# Patient Record
Sex: Male | Born: 1945 | Race: White | Hispanic: No | Smoking: Former smoker
Health system: Southern US, Community
[De-identification: ages and names within clinical notes are randomized; demographics above are authoritative.]

## PROBLEM LIST (undated history)

## (undated) DIAGNOSIS — M722 Plantar fascial fibromatosis: Secondary | ICD-10-CM

## (undated) DIAGNOSIS — S2239XA Fracture of one rib, unspecified side, initial encounter for closed fracture: Secondary | ICD-10-CM

## (undated) DIAGNOSIS — K635 Polyp of colon: Secondary | ICD-10-CM

## (undated) DIAGNOSIS — K449 Diaphragmatic hernia without obstruction or gangrene: Secondary | ICD-10-CM

## (undated) DIAGNOSIS — I10 Essential (primary) hypertension: Secondary | ICD-10-CM

## (undated) DIAGNOSIS — R918 Other nonspecific abnormal finding of lung field: Secondary | ICD-10-CM

## (undated) DIAGNOSIS — K219 Gastro-esophageal reflux disease without esophagitis: Secondary | ICD-10-CM

## (undated) DIAGNOSIS — M1990 Unspecified osteoarthritis, unspecified site: Secondary | ICD-10-CM

## (undated) DIAGNOSIS — R0602 Shortness of breath: Secondary | ICD-10-CM

## (undated) DIAGNOSIS — G4733 Obstructive sleep apnea (adult) (pediatric): Secondary | ICD-10-CM

## (undated) DIAGNOSIS — E669 Obesity, unspecified: Secondary | ICD-10-CM

## (undated) DIAGNOSIS — D751 Secondary polycythemia: Secondary | ICD-10-CM

## (undated) HISTORY — DX: Other nonspecific abnormal finding of lung field: R91.8

## (undated) HISTORY — PX: GALLBLADDER SURGERY: SHX652

## (undated) HISTORY — PX: APPENDECTOMY: SHX54

## (undated) HISTORY — DX: Polyp of colon: K63.5

## (undated) HISTORY — PX: ESOPHAGOGASTRODUODENOSCOPY: SHX1529

## (undated) HISTORY — DX: Unspecified osteoarthritis, unspecified site: M19.90

## (undated) HISTORY — PX: REPLACEMENT TOTAL KNEE: SUR1224

## (undated) HISTORY — DX: Plantar fascial fibromatosis: M72.2

## (undated) HISTORY — PX: DENTAL SURGERY: SHX609

## (undated) HISTORY — DX: Obesity, unspecified: E66.9

## (undated) HISTORY — DX: Essential (primary) hypertension: I10

## (undated) HISTORY — DX: Fracture of one rib, unspecified side, initial encounter for closed fracture: S22.39XA

## (undated) HISTORY — DX: Secondary polycythemia: D75.1

## (undated) HISTORY — DX: Obstructive sleep apnea (adult) (pediatric): G47.33

## (undated) HISTORY — DX: Shortness of breath: R06.02

## (undated) HISTORY — PX: SKIN BIOPSY: SHX1

## (undated) HISTORY — DX: Gastro-esophageal reflux disease without esophagitis: K21.9

## (undated) HISTORY — PX: TONSILLECTOMY: SUR1361

## (undated) HISTORY — DX: Diaphragmatic hernia without obstruction or gangrene: K44.9

---
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
## 2012-11-10 HISTORY — PX: COLONOSCOPY: SHX174

## 2012-11-10 HISTORY — PX: UPPER GASTROINTESTINAL ENDOSCOPY: SHX188

## 2017-11-10 DIAGNOSIS — K760 Fatty (change of) liver, not elsewhere classified: Secondary | ICD-10-CM

## 2017-11-10 HISTORY — DX: Fatty (change of) liver, not elsewhere classified: K76.0

## 2018-09-24 DIAGNOSIS — G4733 Obstructive sleep apnea (adult) (pediatric): Secondary | ICD-10-CM | POA: Insufficient documentation

## 2018-09-24 DIAGNOSIS — Z9989 Dependence on other enabling machines and devices: Secondary | ICD-10-CM | POA: Insufficient documentation

## 2018-09-24 DIAGNOSIS — I77810 Thoracic aortic ectasia: Secondary | ICD-10-CM | POA: Insufficient documentation

## 2018-09-24 DIAGNOSIS — K449 Diaphragmatic hernia without obstruction or gangrene: Secondary | ICD-10-CM | POA: Insufficient documentation

## 2018-09-24 DIAGNOSIS — I10 Essential (primary) hypertension: Secondary | ICD-10-CM | POA: Insufficient documentation

## 2018-09-24 DIAGNOSIS — R918 Other nonspecific abnormal finding of lung field: Secondary | ICD-10-CM | POA: Insufficient documentation

## 2018-09-24 DIAGNOSIS — Z800 Family history of malignant neoplasm of digestive organs: Secondary | ICD-10-CM | POA: Insufficient documentation

## 2019-01-10 DIAGNOSIS — Z86010 Personal history of colonic polyps: Secondary | ICD-10-CM | POA: Insufficient documentation

## 2019-11-07 LAB — BASIC METABOLIC PANEL
BUN: 14 (ref 4–21)
CO2: 30 — AB (ref 13–22)
Chloride: 108 (ref 99–108)
Creatinine: 1.1 (ref 0.6–1.3)
Glucose: 111
Potassium: 4.1 (ref 3.4–5.3)
Sodium: 143 (ref 137–147)

## 2019-11-07 LAB — COMPREHENSIVE METABOLIC PANEL
Albumin: 4.4 (ref 3.5–5.0)
Calcium: 9.3 (ref 8.7–10.7)
GFR calc non Af Amer: 60
Globulin: 6.7

## 2019-11-07 LAB — LIPID PANEL
Cholesterol: 146 (ref 0–200)
HDL: 38 (ref 35–70)
LDL Cholesterol: 82
Triglycerides: 130 (ref 40–160)

## 2019-11-07 LAB — CBC AND DIFFERENTIAL
HCT: 53 (ref 41–53)
Hemoglobin: 17.3 (ref 13.5–17.5)
Platelets: 128 — AB (ref 150–399)
WBC: 7.3

## 2019-11-07 LAB — HEPATIC FUNCTION PANEL
ALT: 48 — AB (ref 10–40)
AST: 39 (ref 14–40)
Alkaline Phosphatase: 71 (ref 25–125)
Bilirubin, Total: 0.5

## 2019-11-07 LAB — PSA: PSA: 3.26

## 2019-11-07 LAB — TSH: TSH: 4.13 (ref 0.41–5.90)

## 2019-11-07 LAB — CBC: RBC: 5.82 — AB (ref 3.87–5.11)

## 2020-02-06 ENCOUNTER — Ambulatory Visit: Payer: Self-pay | Attending: Internal Medicine

## 2020-02-06 DIAGNOSIS — Z23 Encounter for immunization: Secondary | ICD-10-CM

## 2020-02-06 NOTE — Progress Notes (Signed)
   Covid-19 Vaccination Clinic  Name:  Justin Padilla    MRN: [id number] DOB: 1946-05-24  02/06/2020  [name] was observed post Covid-19 immunization for 15 minutes without incident. He was provided with Vaccine Information Sheet and instruction to access the V-Safe system.   [name] was instructed to call 911 with any severe reactions post vaccine: Marland Kitchen Difficulty breathing  . Swelling of face and throat  . A fast heartbeat  . A bad rash all over body  . Dizziness and weakness   Immunizations Administered    Name Date Dose VIS Date Route   Pfizer COVID-19 Vaccine 02/06/2020 10:25 AM 0.3 mL 10/21/2019 Intramuscular   Manufacturer: [place]   Lot: [id number]   [place]: [id number]

## 2020-02-29 ENCOUNTER — Ambulatory Visit: Payer: Self-pay | Attending: Internal Medicine

## 2020-02-29 DIAGNOSIS — Z23 Encounter for immunization: Secondary | ICD-10-CM

## 2020-02-29 NOTE — Progress Notes (Signed)
   Covid-19 Vaccination Clinic  Name:  Justin Padilla    MRN: [id number] DOB: 09/03/1946  02/29/2020  [name] was observed post Covid-19 immunization for 15 minutes without incident. He was provided with Vaccine Information Sheet and instruction to access the V-Safe system.   [name] was instructed to call 911 with any severe reactions post vaccine: Marland Kitchen Difficulty breathing  . Swelling of face and throat  . A fast heartbeat  . A bad rash all over body  . Dizziness and weakness   Immunizations Administered    Name Date Dose VIS Date Route   Pfizer COVID-19 Vaccine 02/29/2020  9:40 AM 0.3 mL 01/04/2019 Intramuscular   Manufacturer: [place]   Lot: [id number]   [place]: [id number]

## 2020-06-14 ENCOUNTER — Ambulatory Visit: Payer: Medicare Other | Admitting: Internal Medicine

## 2020-06-18 ENCOUNTER — Ambulatory Visit (INDEPENDENT_AMBULATORY_CARE_PROVIDER_SITE_OTHER): Payer: Medicare Other | Admitting: Internal Medicine

## 2020-06-18 ENCOUNTER — Encounter: Payer: Self-pay | Admitting: Internal Medicine

## 2020-06-18 ENCOUNTER — Other Ambulatory Visit: Payer: Self-pay

## 2020-06-18 VITALS — BP 128/80 | HR 68 | Temp 95.9°F | Resp 18 | Ht 66.0 in | Wt 271.2 lb

## 2020-06-18 DIAGNOSIS — G629 Polyneuropathy, unspecified: Secondary | ICD-10-CM | POA: Insufficient documentation

## 2020-06-18 DIAGNOSIS — Z86010 Personal history of colonic polyps: Secondary | ICD-10-CM

## 2020-06-18 DIAGNOSIS — R7303 Prediabetes: Secondary | ICD-10-CM | POA: Insufficient documentation

## 2020-06-18 DIAGNOSIS — Z9989 Dependence on other enabling machines and devices: Secondary | ICD-10-CM

## 2020-06-18 DIAGNOSIS — M722 Plantar fascial fibromatosis: Secondary | ICD-10-CM | POA: Insufficient documentation

## 2020-06-18 DIAGNOSIS — I77810 Thoracic aortic ectasia: Secondary | ICD-10-CM

## 2020-06-18 DIAGNOSIS — R739 Hyperglycemia, unspecified: Secondary | ICD-10-CM

## 2020-06-18 DIAGNOSIS — J984 Other disorders of lung: Secondary | ICD-10-CM | POA: Insufficient documentation

## 2020-06-18 DIAGNOSIS — D751 Secondary polycythemia: Secondary | ICD-10-CM | POA: Diagnosis not present

## 2020-06-18 DIAGNOSIS — K449 Diaphragmatic hernia without obstruction or gangrene: Secondary | ICD-10-CM

## 2020-06-18 DIAGNOSIS — G4733 Obstructive sleep apnea (adult) (pediatric): Secondary | ICD-10-CM

## 2020-06-18 DIAGNOSIS — R635 Abnormal weight gain: Secondary | ICD-10-CM

## 2020-06-18 DIAGNOSIS — R918 Other nonspecific abnormal finding of lung field: Secondary | ICD-10-CM

## 2020-06-18 DIAGNOSIS — I10 Essential (primary) hypertension: Secondary | ICD-10-CM

## 2020-06-18 DIAGNOSIS — Z1159 Encounter for screening for other viral diseases: Secondary | ICD-10-CM

## 2020-06-18 DIAGNOSIS — M1712 Unilateral primary osteoarthritis, left knee: Secondary | ICD-10-CM | POA: Insufficient documentation

## 2020-06-18 DIAGNOSIS — Z87891 Personal history of nicotine dependence: Secondary | ICD-10-CM | POA: Insufficient documentation

## 2020-06-18 NOTE — Progress Notes (Signed)
Provider:  Rexene Edison. Mariea Clonts, D.O., C.M.D. Location:   [place]  Place of Service:    clinic  Previous GBT:DVVOHYW he saw Dr. Jim Desanctis, family medicine at [facility] in the past  Extended Emergency Contact Information Primary Emergency Contact: Antonieta Loveless Mobile Phone: [phone] Relation: Niece  Goals of Care: Advanced Directive information Advanced Directives 06/18/2020  Does Patient Have a Medical Advance Directive? No  Would patient like information on creating a medical advance directive? No - Patient declined  encouraged him to bring a copy of his living will and hcpoa--paperwork he filled out says he does have these  Chief Complaint  Patient presents with  . Establish Care    New Patient.    HPI: Patient is a 74 y.o. male seen today to establish with [facility].  He has a h/o HTN, polycythemia and arthritis.  He's had prior knee surgeries and replacement.  He also had prior chole, appe and tonsillectomy.    He moved here in March to be closer to family.  His niece has been after him for years.    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  He worked even as a kid there.  Masks were not worn and diamond blades did not exist.  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  Worked until 2006 when right knee gave out.  Played football and hurt the right knee when young and had one surgery, then second surgery had to be done to fix something after the first.  Gave him trouble for a long time.  Finally had replacement in 2006.  He was having to get shots every 10-14 mos and be on crutches before that.    Lungs are the one thing that's been a problem.  His ex-wife worked in cardiology and was worried about it.  They did a treadmill test.  Heart was ok at that time but it was his lungs.  Saw pulmonary.  He's had prior CTs of chest with pulmonary nodules.  It was felt to be occupational lung disease mostly from  marble but also he did smoke.  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  He still has sob.  He's 8 mos behind from f/u CT of his lungs.  Usually gets his aortic ectasia sonogram at the same time.     Has been taking metoprolol for over 10 years.  fingertips were getting sensitive and started to get ridges on nails.  Had vitamins tested.  Balls of feet and spot on left lateral thigh also numb--gets really hot there.  He saw a TV program that indicated these things could be from the metoprolol.  I'm not familiar with this as a long-term side effect.  We discussed that maybe in the future we could consider changing his medication.   BP is controlled.  He knows if he lost weight or grew taller, that would help.  Was 175 lbs in 2007, gained 50 lbs when quit smoking in 2009, then could not work--did not have full ROM of right knee.  He cuts grass, sits around the house and eats.   He did start going to the gym 3-4 times per week.  He was doing better, then covid came along.  His niece's husband advised against going to the gym.  He cannot walk far.  His breathing was not as bad a year and a half  ago but his weight was not either.  Wakes up early in the morning and cannot go back to sleep.  Does stay up till at least 9:30pm sometimes 11pm.  Will eat breakfast around 7:30/8--has his coffee, eats breakfast whatever he feels like.  He eats 4 times a day, but not meals--small stuff.  Has one true meal.  Otherwise not good snacks.  His niece says he's eating too many carbs.    He does have an inhaler that he used for 3 mos, but did not see benefit so he quit it.  Tried to use it again.  Advair diskus.    He wants to try a different blood pressure medication.    He's had polycythemia since he was 74 yo.  He went through a battery of tests.  No tumor was found stem to stern.  He was building a home for he and his wife which took 62mos.  He moved into the house after 3 mos and then left  her.    Last set of labs was 12/20.    He thinks he had polyps on his colonoscopy but benign in 2020.  He also had endoscopy--had some benign findings there.  He was having reflux--medication cleared it up in a month.  No longer takes it.  He's had a hiatal hernia since he was about 20.    He does not take vaccines outside of covid vaccines.  He does not take flu shots either.  His first wife was a Marine scientist and in the early '60s, she gave him a flu shot and he had vomiting and diarrhea.  He has not taken one since.  He knows it's not a live vaccine.  Shingles scares him.  He did have chickenpox as a child.  He might consider getting the shingrix.  Has OSA and does use CPAP.  His second wife used to wake him up when he'd quit breathing.  He had a home sleep study for his original diagnosis.    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    His vision is declining.    Past Medical History:  Diagnosis Date  . Arthritis   . High blood pressure   . Polycythemia    Past Surgical History:  Procedure Laterality Date  . APPENDECTOMY    . GALLBLADDER SURGERY    . REPLACEMENT TOTAL KNEE     Right Knee  . TONSILLECTOMY      Social History   Socioeconomic History  . Marital status: Divorced    Spouse name: Not on file  . Number of children: Not on file  . Years of education: Not on file  . Highest education level: Not on file  Occupational History  . Not on file  Tobacco Use  . Smoking status: Former Smoker    Years: 45.00  . Smokeless tobacco: Never Used  Substance and Sexual Activity  . Alcohol use: Yes    Alcohol/week: 1.0 standard drink    Types: 1 Cans of beer per week    Comment: every month or 2 months.   . Drug use: Not Currently    Comment: Marijuana long time ago it was occassional smoking,  . Sexual activity: Not Currently  Other Topics Concern  . Not on file  Social History Narrative   Diet:  No      Do you drink/ eat things with caffeine? Yes       Marital status:  Single                         What year were you married ? 1967      Do you live in a house, apartment,assistred living, condo, trailer, etc.)? House      Is it one or more stories? No      How many persons live in your home ?  Me      Do you have any pets in your home ?(please list)  NO      Highest Level of education completed: [employer]       Current or past profession: [employer]      Do you exercise?  No                            Type & how often       ADVANCED DIRECTIVES (Please bring copies)      Do you have a living will? Yes      Do you have a DNR form?   Yes                    If not, do you want to discuss one?       Do you have signed POA?HPOA forms?   Yes              If so, please bring to your appointment      FUNCTIONAL STATUS- To be completed by Spouse / child / Staff       Do you have difficulty bathing or dressing yourself ? No      Do you have difficulty preparing food or eating ? No      Do you have difficulty managing your mediation ? No      Do you have difficulty managing your finances ? No      Do you have difficulty affording your medication ? No      Social Determinants of Health   Financial Resource Strain:   . Difficulty of Paying Living Expenses:   Food Insecurity:   . Worried About Charity fundraiser in the Last Year:   . Arboriculturist in the Last Year:   Transportation Needs:   . Film/video editor (Medical):   Marland Kitchen Lack of Transportation (Non-Medical):   Physical Activity:   . Days of Exercise per Week:   . Minutes of Exercise per Session:   Stress:   . Feeling of Stress :   Social Connections:   . Frequency of Communication with Friends and Family:   . Frequency of Social Gatherings with Friends and Family:   . Attends Religious Services:   . Active Member of Clubs or Organizations:   . Attends Archivist Meetings:   Marland Kitchen Marital Status:     reports that he has quit  smoking. He quit after 45.00 years of use. He has never used smokeless tobacco. He reports current alcohol use of about 1.0 standard drink of alcohol per week. He reports previous drug use.  Functional Status Survey:    Family History  Problem Relation Age of Onset  . Hepatitis C Mother 14  . Stomach cancer Father 41  . Von Willebrand disease Sister 39  . Autoimmune disease Brother 48  . Anuerysm Son 50    Health Maintenance  Topic Date Due  .  Hepatitis C Screening  Never done  . TETANUS/TDAP  Never done  . COLONOSCOPY  Never done  . PNA vac Low Risk Adult (1 of 2 - PCV13) Never done  . INFLUENZA VACCINE  06/10/2020  . COVID-19 Vaccine  Completed    No Known Allergies  Outpatient Encounter Medications as of 06/18/2020  Medication Sig  . Acetaminophen (TYLENOL PO) Take 2 tablets by mouth in the morning.   . metoprolol succinate (TOPROL-XL) 25 MG 24 hr tablet Take 12.5 mg by mouth 2 (two) times daily. 1/2 in the morning and 1/2 in the evening.  . Multiple Vitamins-Minerals (CENTRUM SILVER PO) Take 1 tablet by mouth daily.   No facility-administered encounter medications on file as of 06/18/2020.    Review of Systems  Constitutional: Positive for malaise/fatigue. Negative for chills and fever.       Weight gain  HENT: Positive for hearing loss.        Right ear; dentures  Eyes: Positive for blurred vision.  Respiratory: Positive for shortness of breath. Negative for cough, sputum production and wheezing.   Cardiovascular: Negative for chest pain, palpitations, orthopnea, leg swelling and PND.  Gastrointestinal: Negative for abdominal pain, blood in stool, constipation, diarrhea and melena.       H/o colon polyps  Genitourinary: Negative for dysuria.  Musculoskeletal: Positive for back pain and joint pain. Negative for falls.  Skin: Negative for itching and rash.  Neurological: Negative for dizziness and loss of consciousness.  Psychiatric/Behavioral: Negative for depression  and memory loss. The patient has insomnia. The patient is not nervous/anxious.     Vitals:   06/18/20 1015  BP: 128/80  Pulse: 68  Resp: 18  Temp: (!) 95.9 F (35.5 C)  SpO2: 94%  Weight: 271 lb 3.2 oz (123 kg)  Height: 5\' 6"  (1.676 m)   Body mass index is 43.77 kg/m. Physical Exam Vitals reviewed.  Constitutional:      General: He is not in acute distress.    Appearance: Normal appearance. He is obese. He is not toxic-appearing.  HENT:     Head: Normocephalic and atraumatic.     Right Ear: Tympanic membrane, ear canal and external ear normal.     Left Ear: Tympanic membrane, ear canal and external ear normal.     Ears:     Comments: HOH right    Nose: Nose normal.     Mouth/Throat:     Pharynx: Oropharynx is clear.  Eyes:     Extraocular Movements: Extraocular movements intact.     Conjunctiva/sclera: Conjunctivae normal.     Pupils: Pupils are equal, round, and reactive to light.  Neck:     Vascular: No carotid bruit.  Cardiovascular:     Rate and Rhythm: Normal rate and regular rhythm.     Heart sounds: No murmur heard.   Pulmonary:     Effort: Pulmonary effort is normal.     Comments: Coarse breath sounds, no wheezing or rhonchi Abdominal:     General: Bowel sounds are normal. There is no distension.     Palpations: Abdomen is soft.     Tenderness: There is no abdominal tenderness. There is no guarding or rebound.  Musculoskeletal:        General: Normal range of motion.     Cervical back: Normal range of motion and neck supple.     Right lower leg: No edema.     Left lower leg: No edema.  Lymphadenopathy:     Cervical:  No cervical adenopathy.  Skin:    General: Skin is warm and dry.  Neurological:     General: No focal deficit present.     Mental Status: He is alert and oriented to person, place, and time. Mental status is at baseline.     Cranial Nerves: No cranial nerve deficit.     Sensory: No sensory deficit.     Motor: No weakness.      Coordination: Coordination normal.     Gait: Gait normal.     Deep Tendon Reflexes: Reflexes normal.  Psychiatric:        Mood and Affect: Mood normal.        Behavior: Behavior normal.        Thought Content: Thought content normal.        Judgment: Judgment normal.    Labs reviewed: Basic Metabolic Panel: No results for input(s): NA, K, CL, CO2, GLUCOSE, BUN, CREATININE, CALCIUM, MG, PHOS in the last 8760 hours. Liver Function Tests: No results for input(s): AST, ALT, ALKPHOS, BILITOT, PROT, ALBUMIN in the last 8760 hours. No results for input(s): LIPASE, AMYLASE in the last 8760 hours. No results for input(s): AMMONIA in the last 8760 hours. CBC: No results for input(s): WBC, NEUTROABS, HGB, HCT, MCV, PLT in the last 8760 hours. Cardiac Enzymes: No results for input(s): CKTOTAL, CKMB, CKMBINDEX, TROPONINI in the last 8760 hours. BNP: Invalid input(s): POCBNP No results found for: HGBA1C No results found for: TSH No results found for: VITAMINB12 No results found for: FOLATE No results found for: IRON, TIBC, FERRITIN  Imaging and Procedures noted on new patient packet: 2020 colonoscopy with Sherre Scarlet 2019 CT chest with Dr. Jim Desanctis 2014 colonoscopy  11/07/2019 Aortic ultrasound--3.5cm--repeat in 1 year recommended 11/07/2019 CT chest Dr.Bill Moskos:  Bilateral pulmonary nodules--some stable, some resolved, some new.  Suggests inflammatory vs infectious process.  F/u 6 mos.    Assessment/Plan 1. Polycythemia -f/u labs when phlebotomy back on site next week, has not been on medication for this - CBC with Differential/Platelet; Future  2. Primary osteoarthritis of left knee -ongoing, encouraged tylenol, trying to keep moving for weight loss and to avoid stiffness, may need injections but hate to give him steroids with his recent weight gain  3. Hiatal hernia -longstanding, not on medication for indigestion  4. History of colon polyps -will need q 3 yr  cscopes due to this and also getting EGDs with stomach cancer family history  5. Pulmonary nodules - felt to be due to his inflammatory disease/silicosis by his report and some notations in records -f/u CT  - CT CHEST NODULE FOLLOW UP LOW DOSE W/O; Future  6. OSA on CPAP - continue use of machine - CT CHEST NODULE FOLLOW UP LOW DOSE W/O; Future  7. Essential hypertension -bp controlled with metoprolol--he is concerned that his numbness of hands and feet is related to this -may need to change med if no other cause is found  8. Thoracic aortic ectasia (HCC) - getting regular checks on size of aorta - CT CHEST NODULE FOLLOW UP LOW DOSE W/O; Future - US Abdomen Complete; Future  9. Plantar fasciitis, right -also interferes with regular exercise along with his knee problems, cont stretches and good shoewear  10. Neuropathy -etiology not clear, f/u tsh, hba1c, wonder what b12 is also   11. 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 Smoking history - notable, as well, ?some component of COPD - Ambulatory referral to Pulmonology  13. Hyperglycemia - f/u labs -improve diet--will get him some more info on this once I get his labs - COMPLETE METABOLIC PANEL WITH GFR; Future - Hemoglobin A1c; Future  14. Weight gain - seems due to changes in his routine with covid - encouraged increased activity even in the house or walking outdoors when temp is suitable -will educate more on diet next time - TSH; Future  15. Encounter for hepatitis C screening test for low risk patient - turns out he had this done so will d/c this order  16. Personal history of nicotine dependence  - needed screening CTs--now getting follow up ones - CT CHEST NODULE FOLLOW UP LOW DOSE W/O; Future -  Ambulatory referral to Pulmonology  Labs/tests ordered:   Orders Placed This Encounter  Procedures  . CT CHEST NODULE FOLLOW UP LOW DOSE W/O    PT NEEDS U/S SCHEDULE ALSO  ins-mcr Epic order/no prev studies  [id number] no spec needs/no to all covid/pt will come alonea and wear mask/no to spinal cord stimu/no to body injector/ CL with pt     Standing Status:   Future    Standing Expiration Date:   06/18/2021    Order Specific Question:   Preferred imaging location?    Answer:   [id number]    Order Specific Question:   Release to patient    Answer:   Immediate    Order Specific Question:   Radiology Contrast Protocol - do NOT remove file path    Answer:   [email or web address]  . US Abdomen Complete    [place] @ 8am & CT @ 9am 271 / no spinal stimulator, body injector or glucose monitor / no needs / mcr  Epic order/ miriam w pt 06/18/2020 no to covid ?s/ miriam    Standing Status:   Future    Standing Expiration Date:   12/19/2020    Order Specific Question:   Preferred imaging location?    Answer:   [facility]    Order Specific Question:   Release to patient    Answer:   Immediate  . CBC with Differential/Platelet    Standing Status:   Future    Standing Expiration Date:   06/18/2021  . COMPLETE METABOLIC PANEL WITH GFR    Standing Status:   Future    Standing Expiration Date:   06/18/2021  . Hemoglobin A1c    Standing Status:   Future    Standing Expiration Date:   06/18/2021  . TSH    Standing Status:   Future    Standing Expiration Date:   06/18/2021  . CBC and differential    This external order was created through the Results Console.  . CBC    This external order was created through the Results Console.  . Basic metabolic panel    This external order was created through the Results Console.  . Comprehensive metabolic panel    This external order was created through the Results Console.  . Lipid panel    This external order was created  through the Results Console.  . Hepatic function panel    This external order was created through the Results Console.  Marland Kitchen PSA    This external order was created through the Results Console.  Marland Kitchen TSH  This external order was created through the Results Console.  . Ambulatory referral to Pulmonology    Referral Priority:   Routine    Referral Type:   Consultation    Referral Reason:   Specialty Services Required    Referred to Provider:   Marshell Garfinkel, MD    Requested Specialty:   Pulmonary Disease    Number of Visits Requested:   1    Kayani Rapaport L. Janessa Mickle, D.O. [facility] [address], [place] [address] Cell Phone (Mon-Fri 8am-5pm):  [phone] On Call:  [phone] & follow prompts after 5pm & weekends Office Phone:  [phone] Office Fax:  [phone]

## 2020-06-28 ENCOUNTER — Other Ambulatory Visit: Payer: Self-pay

## 2020-06-28 ENCOUNTER — Ambulatory Visit: Payer: Self-pay

## 2020-06-28 ENCOUNTER — Other Ambulatory Visit: Payer: Medicare Other

## 2020-06-28 DIAGNOSIS — D751 Secondary polycythemia: Secondary | ICD-10-CM

## 2020-06-28 DIAGNOSIS — R635 Abnormal weight gain: Secondary | ICD-10-CM

## 2020-06-28 DIAGNOSIS — R739 Hyperglycemia, unspecified: Secondary | ICD-10-CM

## 2020-06-29 LAB — COMPLETE METABOLIC PANEL WITH GFR
AG Ratio: 2 (calc) (ref 1.0–2.5)
ALT: 39 U/L (ref 9–46)
AST: 37 U/L — ABNORMAL HIGH (ref 10–35)
Albumin: 4.4 g/dL (ref 3.6–5.1)
Alkaline phosphatase (APISO): 64 U/L (ref 35–144)
BUN/Creatinine Ratio: 13 (calc) (ref 6–22)
BUN: 17 mg/dL (ref 7–25)
CO2: 28 mmol/L (ref 20–32)
Calcium: 9 mg/dL (ref 8.6–10.3)
Chloride: 106 mmol/L (ref 98–110)
Creat: 1.26 mg/dL — ABNORMAL HIGH (ref 0.70–1.18)
GFR, Est African American: 64 mL/min/{1.73_m2} (ref >=60)
GFR, Est Non African American: 55 mL/min/{1.73_m2} — ABNORMAL LOW (ref >=60)
Globulin: 2.2 g/dL (calc) (ref 1.9–3.7)
Glucose, Bld: 122 mg/dL — ABNORMAL HIGH (ref 65–99)
Potassium: 4.9 mmol/L (ref 3.5–5.3)
Sodium: 143 mmol/L (ref 135–146)
Total Bilirubin: 0.7 mg/dL (ref 0.2–1.2)
Total Protein: 6.6 g/dL (ref 6.1–8.1)

## 2020-06-29 LAB — CBC WITH DIFFERENTIAL/PLATELET
Absolute Monocytes: 598 cells/uL (ref 200–950)
Basophils Absolute: 33 cells/uL (ref 0–200)
Basophils Relative: 0.4 %
Eosinophils Absolute: 149 cells/uL (ref 15–500)
Eosinophils Relative: 1.8 %
HCT: 51.1 % — ABNORMAL HIGH (ref 38.5–50.0)
Hemoglobin: 17.3 g/dL — ABNORMAL HIGH (ref 13.2–17.1)
Lymphs Abs: 1975 cells/uL (ref 850–3900)
MCH: 30.5 pg (ref 27.0–33.0)
MCHC: 33.9 g/dL (ref 32.0–36.0)
MCV: 90.1 fL (ref 80.0–100.0)
MPV: 12 fL (ref 7.5–12.5)
Monocytes Relative: 7.2 %
Neutro Abs: 5544 cells/uL (ref 1500–7800)
Neutrophils Relative %: 66.8 %
Platelets: 143 10*3/uL (ref 140–400)
RBC: 5.67 10*6/uL (ref 4.20–5.80)
RDW: 13.8 % (ref 11.0–15.0)
Total Lymphocyte: 23.8 %
WBC: 8.3 10*3/uL (ref 3.8–10.8)

## 2020-06-29 LAB — TSH: TSH: 3.68 mIU/L (ref 0.40–4.50)

## 2020-06-29 LAB — HEMOGLOBIN A1C
Hgb A1c MFr Bld: 5.8 % of total Hgb — ABNORMAL HIGH (ref <5.7)
Mean Plasma Glucose: 120 (calc)
eAG (mmol/L): 6.6 (calc)

## 2020-06-29 NOTE — Progress Notes (Signed)
Blood counts are elevated consistent with his known polycythemia. Thyroid was in normal range. Sugar average was in prediabetic range at 5.8.  This is where improvement in his diet would be helpful.  He had reported poor eating habits at his visit.  Would he like to meet with a dietitian?   I also see that his kidney function has declined slightly.  This may be from poor hydration/not drinking enough water, use of antiinflammatories/nsaids like aleve, motrin/advil/ibuprofen, goody's, bc powder, celebrex, mobic over time.   Recommend drinking 6-8 8oz glasses of water each day and using tylenol and topical therapies for pain.   His cholesterol had been good in Dec at his last doctor's office.  I did not check that again this time.

## 2020-07-04 ENCOUNTER — Inpatient Hospital Stay: Admission: RE | Admit: 2020-07-04 | Payer: Self-pay | Source: Ambulatory Visit

## 2020-07-04 ENCOUNTER — Ambulatory Visit
Admission: RE | Admit: 2020-07-04 | Discharge: 2020-07-04 | Disposition: A | Discharge status: Home / Self Care | Payer: Medicare Other | Source: Ambulatory Visit | Attending: Internal Medicine | Admitting: Internal Medicine

## 2020-07-04 DIAGNOSIS — J984 Other disorders of lung: Secondary | ICD-10-CM

## 2020-07-04 DIAGNOSIS — I77810 Thoracic aortic ectasia: Secondary | ICD-10-CM

## 2020-07-04 DIAGNOSIS — Z87891 Personal history of nicotine dependence: Secondary | ICD-10-CM

## 2020-07-04 DIAGNOSIS — Z9989 Dependence on other enabling machines and devices: Secondary | ICD-10-CM

## 2020-07-04 DIAGNOSIS — R918 Other nonspecific abnormal finding of lung field: Secondary | ICD-10-CM

## 2020-07-04 NOTE — Progress Notes (Signed)
He has some pulmonary nodules as he's had before.  He also has some lymph nodes in his chest that are noted.  A follow-up CT is recommended to look at the lymph nodes in 3-6 months.  I see he has a pulmonary appointment at the end of September for further input from Dr. Vaughan Browner.  There is no mention of any enlargement of the aorta previously noted.  I was told that the slight enlargement of the aorta he had before would be looked at with the CT so the ultrasound I tried to order would be unnecessary.

## 2020-07-17 ENCOUNTER — Encounter: Payer: Self-pay | Admitting: Internal Medicine

## 2020-07-17 DIAGNOSIS — G629 Polyneuropathy, unspecified: Secondary | ICD-10-CM

## 2020-07-17 DIAGNOSIS — M1712 Unilateral primary osteoarthritis, left knee: Secondary | ICD-10-CM

## 2020-07-18 NOTE — Telephone Encounter (Signed)
Rx signed electronically.  Please fax and notify patient this was done.  Thanks.

## 2020-08-07 ENCOUNTER — Institutional Professional Consult (permissible substitution): Payer: Self-pay | Admitting: Pulmonary Disease

## 2020-08-29 ENCOUNTER — Encounter: Payer: Self-pay | Admitting: Pulmonary Disease

## 2020-08-29 ENCOUNTER — Other Ambulatory Visit: Payer: Self-pay

## 2020-08-29 ENCOUNTER — Ambulatory Visit (INDEPENDENT_AMBULATORY_CARE_PROVIDER_SITE_OTHER): Payer: Medicare Other | Admitting: Pulmonary Disease

## 2020-08-29 VITALS — BP 136/78 | HR 58 | Temp 97.9°F | Ht 66.0 in | Wt 271.4 lb

## 2020-08-29 DIAGNOSIS — J984 Other disorders of lung: Secondary | ICD-10-CM

## 2020-08-29 DIAGNOSIS — J449 Chronic obstructive pulmonary disease, unspecified: Secondary | ICD-10-CM

## 2020-08-29 DIAGNOSIS — Z87891 Personal history of nicotine dependence: Secondary | ICD-10-CM | POA: Diagnosis not present

## 2020-08-29 DIAGNOSIS — R590 Localized enlarged lymph nodes: Secondary | ICD-10-CM | POA: Diagnosis not present

## 2020-08-29 DIAGNOSIS — R918 Other nonspecific abnormal finding of lung field: Secondary | ICD-10-CM

## 2020-08-29 NOTE — Addendum Note (Signed)
Addended by: Elton Sin on: 08/29/2020 01:44 PM   Modules accepted: Orders

## 2020-08-29 NOTE — Patient Instructions (Addendum)
I have reviewed his CT scan which shows small lung nodules which appear benign We will schedule you for low-dose screening CT of the chest starting in August 2022 We will get pulmonary function test for evaluation of COPD given findings of emphysema on CT scan  Follow-up in 3 months.
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
## 2020-09-19 ENCOUNTER — Other Ambulatory Visit: Payer: Self-pay

## 2020-09-19 ENCOUNTER — Ambulatory Visit (INDEPENDENT_AMBULATORY_CARE_PROVIDER_SITE_OTHER): Payer: Medicare Other | Admitting: Pulmonary Disease

## 2020-09-19 DIAGNOSIS — J449 Chronic obstructive pulmonary disease, unspecified: Secondary | ICD-10-CM

## 2020-09-19 NOTE — Progress Notes (Signed)
Full PFT performed today. °

## 2020-09-24 ENCOUNTER — Ambulatory Visit (INDEPENDENT_AMBULATORY_CARE_PROVIDER_SITE_OTHER): Payer: Medicare Other | Admitting: Internal Medicine

## 2020-09-24 ENCOUNTER — Encounter: Payer: Self-pay | Admitting: Internal Medicine

## 2020-09-24 ENCOUNTER — Other Ambulatory Visit: Payer: Self-pay

## 2020-09-24 VITALS — BP 122/62 | HR 75 | Temp 98.0°F | Ht 66.0 in | Wt 272.0 lb

## 2020-09-24 DIAGNOSIS — R739 Hyperglycemia, unspecified: Secondary | ICD-10-CM

## 2020-09-24 DIAGNOSIS — Z23 Encounter for immunization: Secondary | ICD-10-CM

## 2020-09-24 DIAGNOSIS — D751 Secondary polycythemia: Secondary | ICD-10-CM

## 2020-09-24 DIAGNOSIS — R918 Other nonspecific abnormal finding of lung field: Secondary | ICD-10-CM

## 2020-09-24 DIAGNOSIS — Z6841 Body Mass Index (BMI) 40.0 and over, adult: Secondary | ICD-10-CM

## 2020-09-24 DIAGNOSIS — G4733 Obstructive sleep apnea (adult) (pediatric): Secondary | ICD-10-CM

## 2020-09-24 DIAGNOSIS — I10 Essential (primary) hypertension: Secondary | ICD-10-CM | POA: Diagnosis not present

## 2020-09-24 DIAGNOSIS — J984 Other disorders of lung: Secondary | ICD-10-CM

## 2020-09-24 DIAGNOSIS — Z9989 Dependence on other enabling machines and devices: Secondary | ICD-10-CM

## 2020-09-24 DIAGNOSIS — J439 Emphysema, unspecified: Secondary | ICD-10-CM

## 2020-09-24 MED ORDER — AMLODIPINE BESYLATE 5 MG PO TABS: 5.0000 mg | ORAL_TABLET | Freq: Every day | ORAL | 3 refills | Status: DC
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
## 2020-09-24 NOTE — Patient Instructions (Addendum)
Please check your blood pressure daily at least one hour after your new amlodipine.  If you are getting readings over 150 consistently for the top number, let me know.  If you get dizzy, lightheaded or get swelling in your ankles, let me know.    Fat and Cholesterol Restricted Eating Plan Getting too much fat and cholesterol in your diet may cause health problems. Choosing the right foods helps keep your fat and cholesterol at normal levels. This can keep you from getting certain diseases. What are tips for following this plan? Meal planning  At meals, divide your plate into four equal parts: ? Fill one-half of your plate with vegetables and green salads. ? Fill one-fourth of your plate with whole grains. ? Fill one-fourth of your plate with low-fat (lean) protein foods.  Eat fish that is high in omega-3 fats at least two times a week. This includes mackerel, tuna, sardines, and salmon.  Eat foods that are high in fiber, such as whole grains, beans, apples, broccoli, carrots, peas, and barley. General tips   Work with your doctor to lose weight if you need to.  Avoid: ? Foods with added sugar. ? Fried foods. ? Foods with partially hydrogenated oils.  Limit alcohol intake to no more than 1 drink a day for nonpregnant women and 2 drinks a day for men. One drink equals 12 oz of beer, 5 oz of wine, or 1 oz of hard liquor. Reading food labels  Check food labels for: ? Trans fats. ? Partially hydrogenated oils. ? Saturated fat (g) in each serving. ? Cholesterol (mg) in each serving. ? Fiber (g) in each serving.  Choose foods with healthy fats, such as: ? Monounsaturated fats. ? Polyunsaturated fats. ? Omega-3 fats.  Choose grain products that have whole grains. Look for the word "whole" as the first word in the ingredient list. Cooking  Cook foods using low-fat methods. These include baking, boiling, grilling, and broiling.  Eat more home-cooked foods. Eat at restaurants and  buffets less often.  Avoid cooking using saturated fats, such as butter, cream, palm oil, palm kernel oil, and coconut oil. Recommended foods  Fruits  All fresh, canned (in natural juice), or frozen fruits. Vegetables  Fresh or frozen vegetables (raw, steamed, roasted, or grilled). Green salads. Grains  Whole grains, such as whole wheat or whole grain breads, crackers, cereals, and pasta. Unsweetened oatmeal, bulgur, barley, quinoa, or brown rice. Corn or whole wheat flour tortillas. Meats and other protein foods  Ground beef (85% or leaner), grass-fed beef, or beef trimmed of fat. Skinless chicken or [place]. Ground chicken or [place]. Pork trimmed of fat. All fish and seafood. Egg whites. Dried beans, peas, or lentils. Unsalted nuts or seeds. Unsalted canned beans. Nut butters without added sugar or oil. Dairy  Low-fat or nonfat dairy products, such as skim or 1% milk, 2% or reduced-fat cheeses, low-fat and fat-free ricotta or cottage cheese, or plain low-fat and nonfat yogurt. Fats and oils  Tub margarine without trans fats. Light or reduced-fat mayonnaise and salad dressings. Avocado. Olive, canola, sesame, or safflower oils. The items listed above may not be a complete list of foods and beverages you can eat. Contact a dietitian for more information. Foods to avoid Fruits  Canned fruit in heavy syrup. Fruit in cream or butter sauce. Fried fruit. Vegetables  Vegetables cooked in cheese, cream, or butter sauce. Fried vegetables. Grains  White bread. White pasta. White rice. Cornbread. Bagels, pastries, and croissants. Crackers and snack  foods that contain trans fat and hydrogenated oils. Meats and other protein foods  Fatty cuts of meat. Ribs, chicken wings, bacon, sausage, bologna, salami, chitterlings, fatback, hot dogs, bratwurst, and packaged lunch meats. Liver and organ meats. Whole eggs and egg yolks. Chicken and [place] with skin. Fried meat. Dairy  Whole or 2% milk,  cream, half-and-half, and cream cheese. Whole milk cheeses. Whole-fat or sweetened yogurt. Full-fat cheeses. Nondairy creamers and whipped toppings. Processed cheese, cheese spreads, and cheese curds. Beverages  Alcohol. Sugar-sweetened drinks such as sodas, lemonade, and fruit drinks. Fats and oils  Butter, stick margarine, lard, shortening, ghee, or bacon fat. Coconut, palm kernel, and palm oils. Sweets and desserts  Corn syrup, sugars, honey, and molasses. Candy. Jam and jelly. Syrup. Sweetened cereals. Cookies, pies, cakes, donuts, muffins, and ice cream. The items listed above may not be a complete list of foods and beverages you should avoid. Contact a dietitian for more information. Summary  Choosing the right foods helps keep your fat and cholesterol at normal levels. This can keep you from getting certain diseases.  At meals, fill one-half of your plate with vegetables and green salads.  Eat high-fiber foods, like whole grains, beans, apples, carrots, peas, and barley.  Limit added sugar, saturated fats, alcohol, and fried foods. This information is not intended to replace advice given to you by your health care provider. Make sure you discuss any questions you have with your health care provider. Document Revised: 06/30/2018 Document Reviewed: 07/14/2017 Elsevier Patient Education  [place].

## 2020-09-25 LAB — HEMOGLOBIN A1C
Hgb A1c MFr Bld: 6.1 % of total Hgb — ABNORMAL HIGH (ref <5.7)
Mean Plasma Glucose: 128 (calc)
eAG (mmol/L): 7.1 (calc)

## 2020-09-25 NOTE — Progress Notes (Signed)
Sugar average has actually trended up into mid-prediabetic range now.  It's important that he begin decreasing portions of sweets and starchy foods and eat more veggies and protein to improve these numbers and to help him lose weight.

## 2020-10-01 ENCOUNTER — Telehealth: Payer: Self-pay | Admitting: Internal Medicine

## 2020-10-01 ENCOUNTER — Other Ambulatory Visit: Payer: Self-pay

## 2020-10-01 ENCOUNTER — Telehealth: Payer: Self-pay

## 2020-10-01 ENCOUNTER — Ambulatory Visit (INDEPENDENT_AMBULATORY_CARE_PROVIDER_SITE_OTHER): Payer: Medicare Other

## 2020-10-01 DIAGNOSIS — Z23 Encounter for immunization: Secondary | ICD-10-CM | POA: Diagnosis not present

## 2020-10-01 NOTE — Telephone Encounter (Signed)
Received and reviewed his bp readings and the outcome of stopping his metoprolol tartrate and starting amlodipine.  It appears he developed a headache two days in when his pulse went up so the metoprolol was helping his HR also.  I agree with his decision to stop amlodipine and restart metoprolol.    Please enter his note and readings into this phone note.  Thanks.

## 2020-10-01 NOTE — Telephone Encounter (Signed)
Dr. Mariea Clonts record of blood pressure starting 24 hours after first dose of amlodipine 5 mg  11/17  127/76 pulse 80 11/18 126/79 pulse 90 11/19 123/79 pulse 105 11/20 am 129/82 pulse 107 Letter states that  he started feeling headache did not like it !  Started back on Metoprolol   mrtrate 25 mg  24 hours later 11/21 127/76 pulse 66                          11/22 129/81/ pulse 62

## 2020-10-08 LAB — PULMONARY FUNCTION TEST
DL/VA % pred: 102 %
DL/VA: 4.13 ml/min/mmHg/L
DLCO cor % pred: 98 %
DLCO cor: 21.84 ml/min/mmHg
DLCO unc % pred: 98 %
DLCO unc: 21.84 ml/min/mmHg
FEF 25-75 Post: 1.66 L/sec
FEF 25-75 Pre: 1.15 L/sec
FEF2575-%Change-Post: 44 %
FEF2575-%Pred-Post: 89 %
FEF2575-%Pred-Pre: 61 %
FEV1-%Change-Post: 8 %
FEV1-%Pred-Post: 80 %
FEV1-%Pred-Pre: 74 %
FEV1-Post: 2.08 L
FEV1-Pre: 1.92 L
FEV1FVC-%Change-Post: 8 %
FEV1FVC-%Pred-Pre: 97 %
FEV6-%Change-Post: 0 %
FEV6-%Pred-Post: 80 %
FEV6-%Pred-Pre: 81 %
FEV6-Post: 2.69 L
FEV6-Pre: 2.71 L
FEV6FVC-%Change-Post: 0 %
FEV6FVC-%Pred-Post: 106 %
FEV6FVC-%Pred-Pre: 107 %
FVC-%Change-Post: 0 %
FVC-%Pred-Post: 75 %
FVC-%Pred-Pre: 75 %
FVC-Post: 2.71 L
FVC-Pre: 2.71 L
Post FEV1/FVC ratio: 77 %
Post FEV6/FVC ratio: 99 %
Pre FEV1/FVC ratio: 71 %
Pre FEV6/FVC Ratio: 100 %
RV % pred: 99 %
RV: 2.33 L
TLC % pred: 88 %
TLC: 5.51 L

## 2020-12-24 ENCOUNTER — Other Ambulatory Visit: Payer: Self-pay

## 2020-12-24 ENCOUNTER — Encounter: Payer: Self-pay | Admitting: Internal Medicine

## 2020-12-24 ENCOUNTER — Ambulatory Visit (INDEPENDENT_AMBULATORY_CARE_PROVIDER_SITE_OTHER): Payer: Medicare HMO | Admitting: Internal Medicine

## 2020-12-24 VITALS — BP 118/72 | HR 72 | Temp 97.5°F | Ht 66.0 in | Wt 277.4 lb

## 2020-12-24 DIAGNOSIS — J984 Other disorders of lung: Secondary | ICD-10-CM | POA: Diagnosis not present

## 2020-12-24 DIAGNOSIS — Z6841 Body Mass Index (BMI) 40.0 and over, adult: Secondary | ICD-10-CM | POA: Diagnosis not present

## 2020-12-24 DIAGNOSIS — E7800 Pure hypercholesterolemia, unspecified: Secondary | ICD-10-CM

## 2020-12-24 DIAGNOSIS — J439 Emphysema, unspecified: Secondary | ICD-10-CM

## 2020-12-24 DIAGNOSIS — I10 Essential (primary) hypertension: Secondary | ICD-10-CM | POA: Diagnosis not present

## 2020-12-24 DIAGNOSIS — R635 Abnormal weight gain: Secondary | ICD-10-CM | POA: Diagnosis not present

## 2020-12-24 DIAGNOSIS — R413 Other amnesia: Secondary | ICD-10-CM

## 2020-12-24 DIAGNOSIS — G4733 Obstructive sleep apnea (adult) (pediatric): Secondary | ICD-10-CM

## 2020-12-24 DIAGNOSIS — Z9989 Dependence on other enabling machines and devices: Secondary | ICD-10-CM

## 2020-12-24 DIAGNOSIS — R739 Hyperglycemia, unspecified: Secondary | ICD-10-CM | POA: Diagnosis not present

## 2020-12-24 DIAGNOSIS — R918 Other nonspecific abnormal finding of lung field: Secondary | ICD-10-CM | POA: Diagnosis not present

## 2020-12-24 MED ORDER — ALBUTEROL SULFATE HFA 108 (90 BASE) MCG/ACT IN AERS: 2.0000 | INHALATION_SPRAY | Freq: Four times a day (QID) | RESPIRATORY_TRACT | 3 refills | Status: DC | PRN
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
## 2020-12-24 NOTE — Patient Instructions (Addendum)
I recommend you get your covid booster.  Heart-Healthy Eating Plan Heart-healthy meal planning includes:  Eating less unhealthy fats.  Eating more healthy fats.  Making other changes in your diet. Talk with your doctor or a diet specialist (dietitian) to create an eating plan that is right for you  What are tips for following this plan? Cooking Avoid frying your food. Try to bake, boil, grill, or broil it instead. You can also reduce fat by:  Removing the skin from poultry.  Removing all visible fats from meats.  Steaming vegetables in water or broth. Meal planning  At meals, divide your plate into four equal parts: ? Fill one-half of your plate with vegetables and green salads. ? Fill one-fourth of your plate with whole grains. ? Fill one-fourth of your plate with lean protein foods.  Eat 4-5 servings of vegetables per day. A serving of vegetables is: ? 1 cup of raw or cooked vegetables. ? 2 cups of raw leafy greens.  Eat 4-5 servings of fruit per day. A serving of fruit is: ? 1 medium whole fruit. ?  cup of dried fruit. ?  cup of fresh, frozen, or canned fruit. ?  cup of 100% fruit juice.  Eat more foods that have soluble fiber. These are apples, broccoli, carrots, beans, peas, and barley. Try to get 20-30 g of fiber per day.  Eat 4-5 servings of nuts, legumes, and seeds per week: ? 1 serving of dried beans or legumes equals  cup after being cooked. ? 1 serving of nuts is  cup. ? 1 serving of seeds equals 1 tablespoon.   General information  Eat more home-cooked food. Eat less restaurant, buffet, and fast food.  Limit or avoid alcohol.  Limit foods that are high in starch and sugar.  Avoid fried foods.  Lose weight if you are overweight.  Keep track of how much salt (sodium) you eat. This is important if you have high blood pressure. Ask your doctor to tell you more about this.  Try to add vegetarian meals each week. Fats  Choose healthy fats. These  include olive oil and canola oil, flaxseeds, walnuts, almonds, and seeds.  Eat more omega-3 fats. These include salmon, mackerel, sardines, tuna, flaxseed oil, and ground flaxseeds. Try to eat fish at least 2 times each week.  Check food labels. Avoid foods with trans fats or high amounts of saturated fat.  Limit saturated fats. ? These are often found in animal products, such as meats, butter, and cream. ? These are also found in plant foods, such as palm oil, palm kernel oil, and coconut oil.  Avoid foods with partially hydrogenated oils in them. These have trans fats. Examples are stick margarine, some tub margarines, cookies, crackers, and other baked goods. What foods can I eat? Fruits All fresh, canned (in natural juice), or frozen fruits. Vegetables Fresh or frozen vegetables (raw, steamed, roasted, or grilled). Green salads. Grains Most grains. Choose whole wheat and whole grains most of the time. Rice and pasta, including brown rice and pastas made with whole wheat. Meats and other proteins Lean, well-trimmed beef, veal, pork, and lamb. Chicken and [place] without skin. All fish and shellfish. Wild duck, rabbit, pheasant, and venison. Egg whites or low-cholesterol egg substitutes. Dried beans, peas, lentils, and tofu. Seeds and most nuts. Dairy Low-fat or nonfat cheeses, including ricotta and mozzarella. Skim or 1% milk that is liquid, powdered, or evaporated. Buttermilk that is made with low-fat milk. Nonfat or low-fat yogurt. Fats  and oils Non-hydrogenated (trans-free) margarines. Vegetable oils, including soybean, sesame, sunflower, olive, peanut, safflower, corn, canola, and cottonseed. Salad dressings or mayonnaise made with a vegetable oil. Beverages Mineral water. Coffee and tea. Diet carbonated beverages. Sweets and desserts Sherbet, gelatin, and fruit ice. Small amounts of dark chocolate. Limit all sweets and desserts. Seasonings and condiments All seasonings and  condiments. The items listed above may not be a complete list of foods and drinks you can eat. Contact a dietitian for more options. What foods should I avoid? Fruits Canned fruit in heavy syrup. Fruit in cream or butter sauce. Fried fruit. Limit coconut. Vegetables Vegetables cooked in cheese, cream, or butter sauce. Fried vegetables. Grains Breads that are made with saturated or trans fats, oils, or whole milk. Croissants. Sweet rolls. Donuts. High-fat crackers, such as cheese crackers. Meats and other proteins Fatty meats, such as hot dogs, ribs, sausage, bacon, rib-eye roast or steak. High-fat deli meats, such as salami and bologna. Caviar. Domestic duck and goose. Organ meats, such as liver. Dairy Cream, sour cream, cream cheese, and creamed cottage cheese. Whole-milk cheeses. Whole or 2% milk that is liquid, evaporated, or condensed. Whole buttermilk. Cream sauce or high-fat cheese sauce. Yogurt that is made from whole milk. Fats and oils Meat fat, or shortening. Cocoa butter, hydrogenated oils, palm oil, coconut oil, palm kernel oil. Solid fats and shortenings, including bacon fat, salt pork, lard, and butter. Nondairy cream substitutes. Salad dressings with cheese or sour cream. Beverages Regular sodas and juice drinks with added sugar. Sweets and desserts Frosting. Pudding. Cookies. Cakes. Pies. Milk chocolate or white chocolate. Buttered syrups. Full-fat ice cream or ice cream drinks. The items listed above may not be a complete list of foods and drinks to avoid. Contact a dietitian for more information. Summary  Heart-healthy meal planning includes eating less unhealthy fats, eating more healthy fats, and making other changes in your diet.  Eat a balanced diet. This includes fruits and vegetables, low-fat or nonfat dairy, lean protein, nuts and legumes, whole grains, and heart-healthy oils and fats. This information is not intended to replace advice given to you by your health  care provider. Make sure you discuss any questions you have with your health care provider. Document Revised: 12/31/2017 Document Reviewed: 12/04/2017 Elsevier Patient Education  2021 [employer].

## 2020-12-25 LAB — COMPLETE METABOLIC PANEL WITH GFR
AG Ratio: 1.6 (calc) (ref 1.0–2.5)
ALT: 25 U/L (ref 9–46)
AST: 22 U/L (ref 10–35)
Albumin: 4.1 g/dL (ref 3.6–5.1)
Alkaline phosphatase (APISO): 55 U/L (ref 35–144)
BUN: 16 mg/dL (ref 7–25)
CO2: 29 mmol/L (ref 20–32)
Calcium: 9.1 mg/dL (ref 8.6–10.3)
Chloride: 108 mmol/L (ref 98–110)
Creat: 1.06 mg/dL (ref 0.70–1.18)
GFR, Est African American: 79 mL/min/{1.73_m2} (ref >=60)
GFR, Est Non African American: 68 mL/min/{1.73_m2} (ref >=60)
Globulin: 2.5 g/dL (calc) (ref 1.9–3.7)
Glucose, Bld: 97 mg/dL (ref 65–99)
Potassium: 4.5 mmol/L (ref 3.5–5.3)
Sodium: 144 mmol/L (ref 135–146)
Total Bilirubin: 0.3 mg/dL (ref 0.2–1.2)
Total Protein: 6.6 g/dL (ref 6.1–8.1)

## 2020-12-25 LAB — CBC WITH DIFFERENTIAL/PLATELET
Absolute Monocytes: 480 cells/uL (ref 200–950)
Basophils Absolute: 30 cells/uL (ref 0–200)
Basophils Relative: 0.4 %
Eosinophils Absolute: 150 cells/uL (ref 15–500)
Eosinophils Relative: 2 %
HCT: 50.9 % — ABNORMAL HIGH (ref 38.5–50.0)
Hemoglobin: 17.3 g/dL — ABNORMAL HIGH (ref 13.2–17.1)
Lymphs Abs: 1875 cells/uL (ref 850–3900)
MCH: 30.6 pg (ref 27.0–33.0)
MCHC: 34 g/dL (ref 32.0–36.0)
MCV: 89.9 fL (ref 80.0–100.0)
MPV: 11 fL (ref 7.5–12.5)
Monocytes Relative: 6.4 %
Neutro Abs: 4965 cells/uL (ref 1500–7800)
Neutrophils Relative %: 66.2 %
Platelets: 141 10*3/uL (ref 140–400)
RBC: 5.66 10*6/uL (ref 4.20–5.80)
RDW: 13.7 % (ref 11.0–15.0)
Total Lymphocyte: 25 %
WBC: 7.5 10*3/uL (ref 3.8–10.8)

## 2020-12-25 LAB — HEMOGLOBIN A1C
Hgb A1c MFr Bld: 6.2 % of total Hgb — ABNORMAL HIGH (ref <5.7)
Mean Plasma Glucose: 131 mg/dL
eAG (mmol/L): 7.3 mmol/L

## 2020-12-25 LAB — T3, FREE: T3, Free: 2.8 pg/mL (ref 2.3–4.2)

## 2020-12-25 LAB — LIPID PANEL
Cholesterol: 142 mg/dL (ref <200)
HDL: 34 mg/dL — ABNORMAL LOW (ref >=40)
LDL Cholesterol (Calc): 79 mg/dL (calc)
Non-HDL Cholesterol (Calc): 108 mg/dL (calc) (ref <130)
Total CHOL/HDL Ratio: 4.2 (calc) (ref <5.0)
Triglycerides: 203 mg/dL — ABNORMAL HIGH (ref <150)

## 2020-12-25 LAB — TSH: TSH: 4.43 mIU/L (ref 0.40–4.50)

## 2020-12-25 LAB — T4, FREE: Free T4: 1 ng/dL (ref 0.8–1.8)

## 2020-12-25 NOTE — Progress Notes (Signed)
Polycythemia (high blood counts) remains stable. Cholesterol has trended up and so have triglycerides on cholesterol panel.  This is due to the "[place] diet" he showed me the meme about.  I have referred him to a dietitian to help with his diet. Sugar average is stable in prediabetic range.

## 2020-12-31 ENCOUNTER — Encounter: Payer: Self-pay | Admitting: Internal Medicine

## 2021-02-01 ENCOUNTER — Ambulatory Visit (INDEPENDENT_AMBULATORY_CARE_PROVIDER_SITE_OTHER): Payer: Medicare HMO | Admitting: Orthopedic Surgery

## 2021-02-01 ENCOUNTER — Other Ambulatory Visit: Payer: Self-pay

## 2021-02-01 ENCOUNTER — Ambulatory Visit
Admission: RE | Admit: 2021-02-01 | Discharge: 2021-02-01 | Disposition: A | Discharge status: Home / Self Care | Payer: Self-pay | Source: Ambulatory Visit | Attending: Pulmonary Disease | Admitting: Pulmonary Disease

## 2021-02-01 ENCOUNTER — Encounter: Payer: Self-pay | Admitting: Orthopedic Surgery

## 2021-02-01 ENCOUNTER — Other Ambulatory Visit: Payer: Self-pay | Admitting: *Deleted

## 2021-02-01 VITALS — BP 120/55 | HR 64 | Temp 97.3°F | Resp 18 | Ht 66.0 in | Wt 267.8 lb

## 2021-02-01 DIAGNOSIS — G4733 Obstructive sleep apnea (adult) (pediatric): Secondary | ICD-10-CM

## 2021-02-01 DIAGNOSIS — Z9989 Dependence on other enabling machines and devices: Secondary | ICD-10-CM

## 2021-02-01 DIAGNOSIS — Z23 Encounter for immunization: Secondary | ICD-10-CM | POA: Diagnosis not present

## 2021-02-01 DIAGNOSIS — R918 Other nonspecific abnormal finding of lung field: Secondary | ICD-10-CM

## 2021-02-01 DIAGNOSIS — Z6841 Body Mass Index (BMI) 40.0 and over, adult: Secondary | ICD-10-CM | POA: Diagnosis not present

## 2021-02-01 DIAGNOSIS — G4700 Insomnia, unspecified: Secondary | ICD-10-CM

## 2021-02-01 DIAGNOSIS — I10 Essential (primary) hypertension: Secondary | ICD-10-CM | POA: Diagnosis not present

## 2021-02-01 DIAGNOSIS — J439 Emphysema, unspecified: Secondary | ICD-10-CM
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
## 2021-02-01 NOTE — Patient Instructions (Addendum)
Will call with CT chest results when posted May try melatonin 5 mg po QHS (may advance to 10 mg)    Calorie Counting for Weight Loss Calories are units of energy. Your body needs a certain number of calories from food to keep going throughout the day. When you eat or drink more calories than your body needs, your body stores the extra calories mostly as fat. When you eat or drink fewer calories than your body needs, your body burns fat to get the energy it needs. Calorie counting means keeping track of how many calories you eat and drink each day. Calorie counting can be helpful if you need to lose weight. If you eat fewer calories than your body needs, you should lose weight. Ask your health care provider what a healthy weight is for you. For calorie counting to work, you will need to eat the right number of calories each day to lose a healthy amount of weight per week. A dietitian can help you figure out how many calories you need in a day and will suggest ways to reach your calorie goal.  A healthy amount of weight to lose each week is usually 1-2 lb (0.5-0.9 kg). This usually means that your daily calorie intake should be reduced by 500-750 calories.  Eating 1,200-1,500 calories a day can help most women lose weight.  Eating 1,500-1,800 calories a day can help most men lose weight. What do I need to know about calorie counting? Work with your health care provider or dietitian to determine how many calories you should get each day. To meet your daily calorie goal, you will need to:  Find out how many calories are in each food that you would like to eat. Try to do this before you eat.  Decide how much of the food you plan to eat.  Keep a food log. Do this by writing down what you ate and how many calories it had. To successfully lose weight, it is important to balance calorie counting with a healthy lifestyle that includes regular activity. Where do I find calorie information? The number of  calories in a food can be found on a Nutrition Facts label. If a food does not have a Nutrition Facts label, try to look up the calories online or ask your dietitian for help. Remember that calories are listed per serving. If you choose to have more than one serving of a food, you will have to multiply the calories per serving by the number of servings you plan to eat. For example, the label on a package of bread might say that a serving size is 1 slice and that there are 90 calories in a serving. If you eat 1 slice, you will have eaten 90 calories. If you eat 2 slices, you will have eaten 180 calories.   How do I keep a food log? After each time that you eat, record the following in your food log as soon as possible:  What you ate. Be sure to include toppings, sauces, and other extras on the food.  How much you ate. This can be measured in cups, ounces, or number of items.  How many calories were in each food and drink.  The total number of calories in the food you ate. Keep your food log near you, such as in a pocket-sized notebook or on an app or website on your mobile phone. Some programs will calculate calories for you and show you how many calories you  have left to meet your daily goal. What are some portion-control tips?  Know how many calories are in a serving. This will help you know how many servings you can have of a certain food.  Use a measuring cup to measure serving sizes. You could also try weighing out portions on a kitchen scale. With time, you will be able to estimate serving sizes for some foods.  Take time to put servings of different foods on your favorite plates or in your favorite bowls and cups so you know what a serving looks like.  Try not to eat straight from a food's packaging, such as from a bag or box. Eating straight from the package makes it hard to see how much you are eating and can lead to overeating. Put the amount you would like to eat in a cup or on a plate  to make sure you are eating the right portion.  Use smaller plates, glasses, and bowls for smaller portions and to prevent overeating.  Try not to multitask. For example, avoid watching TV or using your computer while eating. If it is time to eat, sit down at a table and enjoy your food. This will help you recognize when you are full. It will also help you be more mindful of what and how much you are eating. What are tips for following this plan? Reading food labels  Check the calorie count compared with the serving size. The serving size may be smaller than what you are used to eating.  Check the source of the calories. Try to choose foods that are high in protein, fiber, and vitamins, and low in saturated fat, trans fat, and sodium. Shopping  Read nutrition labels while you shop. This will help you make healthy decisions about which foods to buy.  Pay attention to nutrition labels for low-fat or fat-free foods. These foods sometimes have the same number of calories or more calories than the full-fat versions. They also often have added sugar, starch, or salt to make up for flavor that was removed with the fat.  Make a grocery list of lower-calorie foods and stick to it. Cooking  Try to cook your favorite foods in a healthier way. For example, try baking instead of frying.  Use low-fat dairy products. Meal planning  Use more fruits and vegetables. One-half of your plate should be fruits and vegetables.  Include lean proteins, such as chicken, [place], and fish. Lifestyle Each week, aim to do one of the following:  150 minutes of moderate exercise, such as walking.  75 minutes of vigorous exercise, such as running. General information  Know how many calories are in the foods you eat most often. This will help you calculate calorie counts faster.  Find a way of tracking calories that works for you. Get creative. Try different apps or programs if writing down calories does not work  for you. What foods should I eat?  Eat nutritious foods. It is better to have a nutritious, high-calorie food, such as an avocado, than a food with few nutrients, such as a bag of potato chips.  Use your calories on foods and drinks that will fill you up and will not leave you hungry soon after eating. ? Examples of foods that fill you up are nuts and nut butters, vegetables, lean proteins, and high-fiber foods such as whole grains. High-fiber foods are foods with more than 5 g of fiber per serving.  Pay attention to calories in drinks. Low-calorie  drinks include water and unsweetened drinks. The items listed above may not be a complete list of foods and beverages you can eat. Contact a dietitian for more information.   What foods should I limit? Limit foods or drinks that are not good sources of vitamins, minerals, or protein or that are high in unhealthy fats. These include:  Candy.  Other sweets.  Sodas, specialty coffee drinks, alcohol, and juice. The items listed above may not be a complete list of foods and beverages you should avoid. Contact a dietitian for more information. How do I count calories when eating out?  Pay attention to portions. Often, portions are much larger when eating out. Try these tips to keep portions smaller: ? Consider sharing a meal instead of getting your own. ? If you get your own meal, eat only half of it. Before you start eating, ask for a container and put half of your meal into it. ? When available, consider ordering smaller portions from the menu instead of full portions.  Pay attention to your food and drink choices. Knowing the way food is cooked and what is included with the meal can help you eat fewer calories. ? If calories are listed on the menu, choose the lower-calorie options. ? Choose dishes that include vegetables, fruits, whole grains, low-fat dairy products, and lean proteins. ? Choose items that are boiled, broiled, grilled, or steamed.  Avoid items that are buttered, battered, fried, or served with cream sauce. Items labeled as crispy are usually fried, unless stated otherwise. ? Choose water, low-fat milk, unsweetened iced tea, or other drinks without added sugar. If you want an alcoholic beverage, choose a lower-calorie option, such as a glass of wine or light beer. ? Ask for dressings, sauces, and syrups on the side. These are usually high in calories, so you should limit the amount you eat. ? If you want a salad, choose a garden salad and ask for grilled meats. Avoid extra toppings such as bacon, cheese, or fried items. Ask for the dressing on the side, or ask for olive oil and vinegar or lemon to use as dressing.  Estimate how many servings of a food you are given. Knowing serving sizes will help you be aware of how much food you are eating at restaurants. Where to find more information  Centers for Disease Control and Prevention: http://www.wolf.info/  [place] Department of Agriculture: http://www.wilson-mendoza.org/ Summary  Calorie counting means keeping track of how many calories you eat and drink each day. If you eat fewer calories than your body needs, you should lose weight.  A healthy amount of weight to lose per week is usually 1-2 lb (0.5-0.9 kg). This usually means reducing your daily calorie intake by 500-750 calories.  The number of calories in a food can be found on a Nutrition Facts label. If a food does not have a Nutrition Facts label, try to look up the calories online or ask your dietitian for help.  Use smaller plates, glasses, and bowls for smaller portions and to prevent overeating.  Use your calories on foods and drinks that will fill you up and not leave you hungry shortly after a meal. This information is not intended to replace advice given to you by your health care provider. Make sure you discuss any questions you have with your health care provider. Document Revised: 12/08/2019 Document Reviewed: 12/08/2019 Elsevier  Patient Education  2021 [employer].

## 2021-02-05 ENCOUNTER — Ambulatory Visit: Payer: Medicare HMO | Admitting: Orthopedic Surgery

## 2021-02-11 ENCOUNTER — Other Ambulatory Visit: Payer: Medicare Other

## 2021-02-13 ENCOUNTER — Ambulatory Visit
Admission: RE | Admit: 2021-02-13 | Discharge: 2021-02-13 | Disposition: A | Discharge status: Home / Self Care | Payer: Medicare HMO | Source: Ambulatory Visit | Attending: Pulmonary Disease | Admitting: Pulmonary Disease

## 2021-02-13 ENCOUNTER — Other Ambulatory Visit: Payer: Self-pay

## 2021-02-13 DIAGNOSIS — R590 Localized enlarged lymph nodes: Secondary | ICD-10-CM

## 2021-02-13 DIAGNOSIS — R918 Other nonspecific abnormal finding of lung field: Secondary | ICD-10-CM

## 2021-02-25 ENCOUNTER — Other Ambulatory Visit: Payer: Self-pay | Admitting: Orthopedic Surgery

## 2021-02-25 DIAGNOSIS — E7800 Pure hypercholesterolemia, unspecified: Secondary | ICD-10-CM

## 2021-02-26 ENCOUNTER — Other Ambulatory Visit: Payer: Medicare HMO

## 2021-02-26 ENCOUNTER — Other Ambulatory Visit: Payer: Self-pay

## 2021-02-26 DIAGNOSIS — E7800 Pure hypercholesterolemia, unspecified: Secondary | ICD-10-CM | POA: Diagnosis not present

## 2021-02-27 LAB — LIPID PANEL
Cholesterol: 122 mg/dL (ref <200)
HDL: 35 mg/dL — ABNORMAL LOW (ref >=40)
LDL Cholesterol (Calc): 67 mg/dL (calc)
Non-HDL Cholesterol (Calc): 87 mg/dL (calc) (ref <130)
Total CHOL/HDL Ratio: 3.5 (calc) (ref <5.0)
Triglycerides: 112 mg/dL (ref <150)

## 2021-02-28 ENCOUNTER — Other Ambulatory Visit: Payer: Self-pay

## 2021-02-28 ENCOUNTER — Encounter: Payer: Self-pay | Admitting: Orthopedic Surgery

## 2021-02-28 ENCOUNTER — Ambulatory Visit (INDEPENDENT_AMBULATORY_CARE_PROVIDER_SITE_OTHER): Payer: Medicare HMO | Admitting: Orthopedic Surgery

## 2021-02-28 VITALS — BP 120/80 | HR 61 | Temp 97.1°F | Ht 66.0 in | Wt 265.6 lb

## 2021-02-28 DIAGNOSIS — R739 Hyperglycemia, unspecified: Secondary | ICD-10-CM | POA: Diagnosis not present

## 2021-02-28 DIAGNOSIS — G4733 Obstructive sleep apnea (adult) (pediatric): Secondary | ICD-10-CM | POA: Diagnosis not present

## 2021-02-28 DIAGNOSIS — R918 Other nonspecific abnormal finding of lung field: Secondary | ICD-10-CM

## 2021-02-28 DIAGNOSIS — I10 Essential (primary) hypertension: Secondary | ICD-10-CM

## 2021-02-28 DIAGNOSIS — Z9989 Dependence on other enabling machines and devices: Secondary | ICD-10-CM

## 2021-02-28 DIAGNOSIS — Z6841 Body Mass Index (BMI) 40.0 and over, adult: Secondary | ICD-10-CM

## 2021-02-28 DIAGNOSIS — Z0000 Encounter for general adult medical examination without abnormal findings: Secondary | ICD-10-CM

## 2021-02-28 DIAGNOSIS — M25562 Pain in left knee: Secondary | ICD-10-CM

## 2021-02-28 DIAGNOSIS — I77810 Thoracic aortic ectasia: Secondary | ICD-10-CM | POA: Diagnosis not present

## 2021-02-28 DIAGNOSIS — G629 Polyneuropathy, unspecified: Secondary | ICD-10-CM

## 2021-02-28 DIAGNOSIS — E7800 Pure hypercholesterolemia, unspecified: Secondary | ICD-10-CM

## 2021-02-28 MED ORDER — GABAPENTIN 100 MG PO CAPS: 100.0000 mg | ORAL_CAPSULE | Freq: Every day | ORAL | 0 refills | Status: DC

## 2021-02-28 NOTE — Patient Instructions (Addendum)
May try gabapentin for left knee and nerve pain.   Neuropathic Pain Neuropathic pain is pain caused by damage to the nerves that are responsible for certain sensations in your body (sensory nerves). The pain can be caused by:  Damage to the sensory nerves that send signals to your spinal cord and brain (peripheral nervous system).  Damage to the sensory nerves in your brain or spinal cord (central nervous system). Neuropathic pain can make you more sensitive to pain. Even a minor sensation can feel very painful. This is usually a long-term condition that can be difficult to treat. The type of pain differs from person to person. It may:  Start suddenly (acute), or it may develop slowly and last for a long time (chronic).  Come and go as damaged nerves heal, or it may stay at the same level for years.  Cause emotional distress, loss of sleep, and a lower quality of life. What are the causes? The most common cause of this condition is diabetes. Many other diseases and conditions can also cause neuropathic pain. Causes of neuropathic pain can be classified as:  Toxic. This is caused by medicines and chemicals. The most common cause of toxic neuropathic pain is damage from cancer treatments (chemotherapy).  Metabolic. This can be caused by: ? Diabetes. This is the most common disease that damages the nerves. ? Lack of vitamin B from long-term alcohol abuse.  Traumatic. Any injury that cuts, crushes, or stretches a nerve can cause damage and pain. A common example is feeling pain after losing an arm or leg (phantom limb pain).  Compression-related. If a sensory nerve gets trapped or compressed for a long period of time, the blood supply to the nerve can be cut off.  Vascular. Many blood vessel diseases can cause neuropathic pain by decreasing blood supply and oxygen to nerves.  Autoimmune. This type of pain results from diseases in which the body's defense system (immune system) mistakenly  attacks sensory nerves. Examples of autoimmune diseases that can cause neuropathic pain include lupus and multiple sclerosis.  Infectious. Many types of viral infections can damage sensory nerves and cause pain. Shingles infection is a common cause of this type of pain.  Inherited. Neuropathic pain can be a symptom of many diseases that are passed down through families (genetic). What increases the risk? You are more likely to develop this condition if:  You have diabetes.  You smoke.  You drink too much alcohol.  You are taking certain medicines, including medicines that kill cancer cells (chemotherapy) or that treat immune system disorders. What are the signs or symptoms? The main symptom is pain. Neuropathic pain is often described as:  Burning.  Shock-like.  Stinging.  Hot or cold.  Itching. How is this diagnosed? No single test can diagnose neuropathic pain. It is diagnosed based on:  Physical exam and your symptoms. Your health care provider will ask you about your pain. You may be asked to use a pain scale to describe how bad your pain is.  Tests. These may be done to see if you have a high sensitivity to pain and to help find the cause and location of any sensory nerve damage. They include: ? Nerve conduction studies to test how well nerve signals travel through your sensory nerves (electrodiagnostic testing). ? Stimulating your sensory nerves through electrodes on your skin and measuring the response in your spinal cord and brain (somatosensory evoked potential).  Imaging studies, such as: ? X-rays. ? CT scan. ?  MRI. How is this treated? Treatment for neuropathic pain may change over time. You may need to try different treatment options or a combination of treatments. Some options include:  Treating the underlying cause of the neuropathy, such as diabetes, kidney disease, or vitamin deficiencies.  Stopping medicines that can cause neuropathy, such as  chemotherapy.  Medicine to relieve pain. Medicines may include: ? Prescription or over-the-counter pain medicine. ? Anti-seizure medicine. ? Antidepressant medicines. ? Pain-relieving patches that are applied to painful areas of skin. ? A medicine to numb the area (local anesthetic), which can be injected as a nerve block.  Transcutaneous nerve stimulation. This uses electrical currents to block painful nerve signals. The treatment is painless.  Alternative treatments, such as: ? Acupuncture. ? Meditation. ? Massage. ? Physical therapy. ? Pain management programs. ? Counseling. Follow these instructions at home: Medicines  Take over-the-counter and prescription medicines only as told by your health care provider.  Do not drive or use heavy machinery while taking prescription pain medicine.  If you are taking prescription pain medicine, take actions to prevent or treat constipation. Your health care provider may recommend that you: ? Drink enough fluid to keep your urine pale yellow. ? Eat foods that are high in fiber, such as fresh fruits and vegetables, whole grains, and beans. ? Limit foods that are high in fat and processed sugars, such as fried or sweet foods. ? Take an over-the-counter or prescription medicine for constipation.   Lifestyle  Have a good support system at home.  Consider joining a chronic pain support group.  Do not use any products that contain nicotine or tobacco, such as cigarettes and e-cigarettes. If you need help quitting, ask your health care provider.  Do not drink alcohol.   General instructions  Learn as much as you can about your condition.  Work closely with all your health care providers to find the treatment plan that works best for you.  Ask your health care provider what activities are safe for you.  Keep all follow-up visits as told by your health care provider. This is important. Contact a health care provider if:  Your pain  treatments are not working.  You are having side effects from your medicines.  You are struggling with tiredness (fatigue), mood changes, depression, or anxiety. Summary  Neuropathic pain is pain caused by damage to the nerves that are responsible for certain sensations in your body (sensory nerves).  Neuropathic pain may come and go as damaged nerves heal, or it may stay at the same level for years.  Neuropathic pain is usually a long-term condition that can be difficult to treat. Consider joining a chronic pain support group. This information is not intended to replace advice given to you by your health care provider. Make sure you discuss any questions you have with your health care provider. Document Revised: 02/17/2019 Document Reviewed: 11/13/2017 Elsevier Patient Education  2021 [employer].
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
## 2021-03-03 ENCOUNTER — Encounter: Payer: Self-pay | Admitting: Orthopedic Surgery

## 2021-03-04 ENCOUNTER — Ambulatory Visit (INDEPENDENT_AMBULATORY_CARE_PROVIDER_SITE_OTHER): Payer: Medicare HMO | Admitting: Family

## 2021-03-04 ENCOUNTER — Encounter: Payer: Self-pay | Admitting: Family

## 2021-03-04 ENCOUNTER — Other Ambulatory Visit: Payer: Self-pay

## 2021-03-04 ENCOUNTER — Ambulatory Visit
Admission: RE | Admit: 2021-03-04 | Discharge: 2021-03-04 | Disposition: A | Discharge status: Home / Self Care | Payer: Medicare HMO | Source: Ambulatory Visit | Attending: Family | Admitting: Family

## 2021-03-04 VITALS — BP 120/78 | HR 63 | Temp 97.1°F | Resp 18 | Ht 66.0 in | Wt 266.8 lb

## 2021-03-04 DIAGNOSIS — M79641 Pain in right hand: Secondary | ICD-10-CM

## 2021-03-04 DIAGNOSIS — M19041 Primary osteoarthritis, right hand: Secondary | ICD-10-CM | POA: Diagnosis not present

## 2021-03-04 DIAGNOSIS — M7989 Other specified soft tissue disorders: Secondary | ICD-10-CM | POA: Diagnosis not present

## 2021-03-04 MED ORDER — IBUPROFEN 600 MG PO TABS: 600.0000 mg | ORAL_TABLET | Freq: Three times a day (TID) | ORAL | 0 refills | Status: DC | PRN

## 2021-03-04 NOTE — Patient Instructions (Signed)
-   Apply ice compressor to hand for 10-20 minute daily  To keep swelling down  - Elevate hand on pillows to keep swelling down   - - Please get right hand  X-ray at [place] at [facility] then will call you with results.
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
## 2021-03-11 ENCOUNTER — Telehealth: Payer: Self-pay | Admitting: Orthopedic Surgery

## 2021-03-11 NOTE — Telephone Encounter (Signed)
Ok to all  

## 2021-03-11 NOTE — Telephone Encounter (Signed)
Patients family Denese calling, states she spoke with Dr. Denton Lank and she recommended you. Denese said it would be this patient and two others who would need new patient appointments. Please let me know. Thank you

## 2021-03-13 ENCOUNTER — Other Ambulatory Visit: Payer: Self-pay

## 2021-03-13 DIAGNOSIS — R918 Other nonspecific abnormal finding of lung field: Secondary | ICD-10-CM

## 2021-03-14 ENCOUNTER — Telehealth: Payer: Self-pay | Admitting: Pulmonary Disease

## 2021-03-14 NOTE — Telephone Encounter (Signed)
error 

## 2021-03-26 ENCOUNTER — Other Ambulatory Visit: Payer: Medicare HMO

## 2021-04-04 DIAGNOSIS — K760 Fatty (change of) liver, not elsewhere classified: Secondary | ICD-10-CM | POA: Insufficient documentation

## 2021-04-04 DIAGNOSIS — I700 Atherosclerosis of aorta: Secondary | ICD-10-CM | POA: Insufficient documentation

## 2021-04-04 NOTE — Patient Instructions (Addendum)
   It was nice to meet you.     Medications changes include :   Try doubling the gabapentin at night to 200 mg    Your prescription(s) have been submitted to your pharmacy. Please take as directed and contact our office if you believe you are having problem(s) with the medication(s).    Please followup in 6 months
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
## 2021-04-05 ENCOUNTER — Ambulatory Visit (INDEPENDENT_AMBULATORY_CARE_PROVIDER_SITE_OTHER): Payer: Medicare HMO | Admitting: Internal Medicine

## 2021-04-05 ENCOUNTER — Encounter: Payer: Self-pay | Admitting: Internal Medicine

## 2021-04-05 ENCOUNTER — Other Ambulatory Visit: Payer: Self-pay

## 2021-04-05 VITALS — BP 120/78 | HR 68 | Temp 98.6°F | Ht 66.0 in | Wt 269.0 lb

## 2021-04-05 DIAGNOSIS — G5712 Meralgia paresthetica, left lower limb: Secondary | ICD-10-CM | POA: Diagnosis not present

## 2021-04-05 DIAGNOSIS — I77810 Thoracic aortic ectasia: Secondary | ICD-10-CM | POA: Diagnosis not present

## 2021-04-05 DIAGNOSIS — K760 Fatty (change of) liver, not elsewhere classified: Secondary | ICD-10-CM | POA: Diagnosis not present

## 2021-04-05 DIAGNOSIS — G4733 Obstructive sleep apnea (adult) (pediatric): Secondary | ICD-10-CM | POA: Diagnosis not present

## 2021-04-05 DIAGNOSIS — R7303 Prediabetes: Secondary | ICD-10-CM

## 2021-04-05 DIAGNOSIS — K219 Gastro-esophageal reflux disease without esophagitis: Secondary | ICD-10-CM | POA: Diagnosis not present

## 2021-04-05 DIAGNOSIS — I10 Essential (primary) hypertension: Secondary | ICD-10-CM

## 2021-04-05 DIAGNOSIS — R739 Hyperglycemia, unspecified: Secondary | ICD-10-CM

## 2021-04-05 DIAGNOSIS — G629 Polyneuropathy, unspecified: Secondary | ICD-10-CM | POA: Diagnosis not present

## 2021-04-05 DIAGNOSIS — I700 Atherosclerosis of aorta: Secondary | ICD-10-CM

## 2021-04-05 DIAGNOSIS — R918 Other nonspecific abnormal finding of lung field: Secondary | ICD-10-CM | POA: Diagnosis not present

## 2021-04-05 DIAGNOSIS — Z9989 Dependence on other enabling machines and devices: Secondary | ICD-10-CM

## 2021-04-05 MED ORDER — GABAPENTIN 100 MG PO CAPS: 100.0000 mg | ORAL_CAPSULE | Freq: Every day | ORAL | 0 refills | Status: DC

## 2021-04-05 MED ORDER — METOPROLOL TARTRATE 25 MG PO TABS: 12.5000 mg | ORAL_TABLET | Freq: Two times a day (BID) | ORAL | 2 refills | Status: DC

## 2021-04-05 NOTE — Assessment & Plan Note (Signed)
Chronic Recent A1c 6.2 Stressed the importance of regular exercise, weight loss and a diet low in sugar and carbohydrates

## 2021-04-05 NOTE — Assessment & Plan Note (Signed)
?    Neuropathy Has some numbness and tingling in fingers and toes We will do blood work at his next visit so that we can evaluate this further Advised him to retry the gabapentin and increase dose to 200 mg at night to see if that helps Next visit we will also check B12 level

## 2021-04-05 NOTE — Assessment & Plan Note (Signed)
Chronic Seen on recent imaging Discussed increased risk for liver cirrhosis Stressed the importance of weight loss

## 2021-04-05 NOTE — Assessment & Plan Note (Signed)
This is new-seen on CT scan last month Will be due for repeat imaging-CTA 02/2022 for follow-up

## 2021-04-05 NOTE — Assessment & Plan Note (Signed)
Chronic He states reflux 3-4 times a week-usually at night Stressed not eating within 3 hours of going to bed Encouraged weight loss Advised that he needs to start taking the medication on a daily basis since he is having this so frequently-recommended Pepcid daily Discussed concerns with uncontrolled GERD

## 2021-04-05 NOTE — Assessment & Plan Note (Addendum)
Chronic Was started on gabapentin 100 mg at night by previous PCP-this did not help Advised that he may need to try a higher dose to see if that helps-can try 200 mg at night Advised weight loss Can refer to orthopedics if there is no improvement

## 2021-04-05 NOTE — Assessment & Plan Note (Signed)
Chronic Recent CT scan showed the lung nodules were stable Will continue imaging yearly for follow-up CT scan due 03/2022

## 2021-04-05 NOTE — Assessment & Plan Note (Signed)
Chronic Seen on recent imaging Discussed this with him Lipids have been well controlled and LDL has been less than 70 without medication Encouraged healthy diet and regular exercise

## 2021-04-05 NOTE — Assessment & Plan Note (Signed)
Chronic Well controlled, stable Continue metoprolol 12.5 mg twice daily

## 2021-04-05 NOTE — Assessment & Plan Note (Addendum)
Chronic Uses cpap nightly-he states he has self adjusted this over the years Discussed that he may benefit from seeing pulmonary to have this tested and adjusted since his weight has increased since he was placed on it

## 2021-05-23 DIAGNOSIS — H524 Presbyopia: Secondary | ICD-10-CM | POA: Diagnosis not present

## 2021-05-23 DIAGNOSIS — Z0100 Encounter for examination of eyes and vision without abnormal findings: Secondary | ICD-10-CM | POA: Diagnosis not present

## 2021-05-23 DIAGNOSIS — H35033 Hypertensive retinopathy, bilateral: Secondary | ICD-10-CM | POA: Diagnosis not present

## 2021-05-29 DIAGNOSIS — D3121 Benign neoplasm of right retina: Secondary | ICD-10-CM | POA: Diagnosis not present

## 2021-05-29 DIAGNOSIS — H35033 Hypertensive retinopathy, bilateral: Secondary | ICD-10-CM | POA: Diagnosis not present

## 2021-08-17 IMAGING — CT CT CHEST W/O CM
2 of 4 series · 12 of 36 positions shown, 15 images · non-contrast
Comparison: None.

CLINICAL DATA: Lung nodules. Chronic shortness of breath, dust
exposure, former smoker.

EXAM:
CT CHEST WITHOUT CONTRAST
TECHNIQUE: Multidetector CT imaging of the chest was performed following the
standard protocol without IV contrast.

[Series 2: chest 2.00 br40 s3 · axial · 0.75mm/px · z∈[+1564,+1828]mm · 9 of 156 slices shown, 12 images (1 of 2)]
[im 12/156  mediastinal]
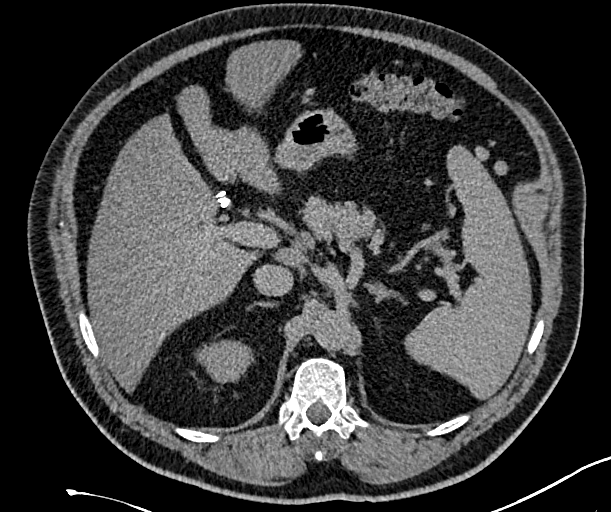
[im 12/156  lung]
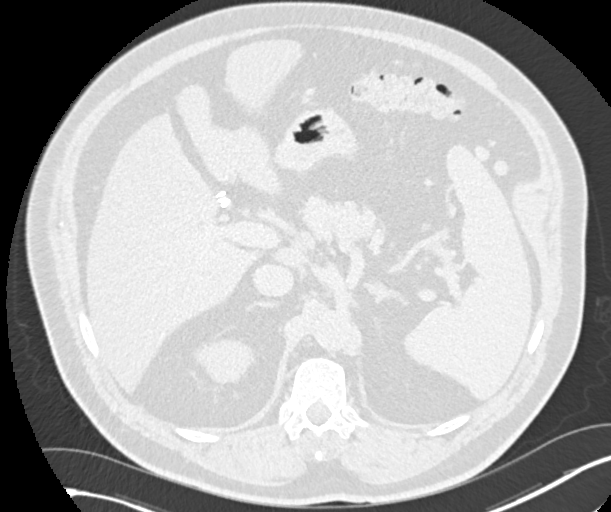
[im 36/156  lung]
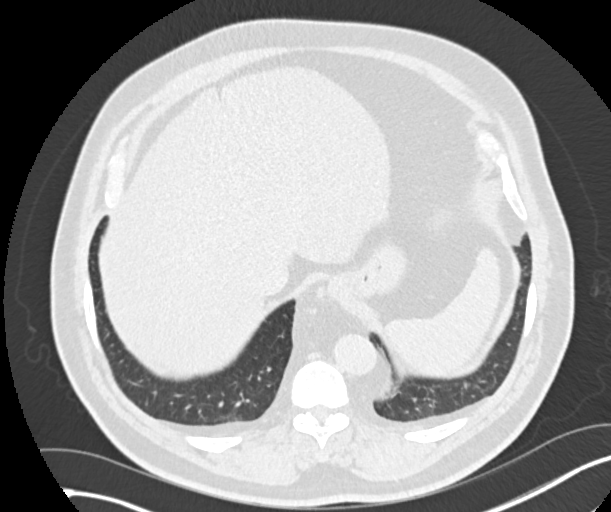
[im 48/156  lung]
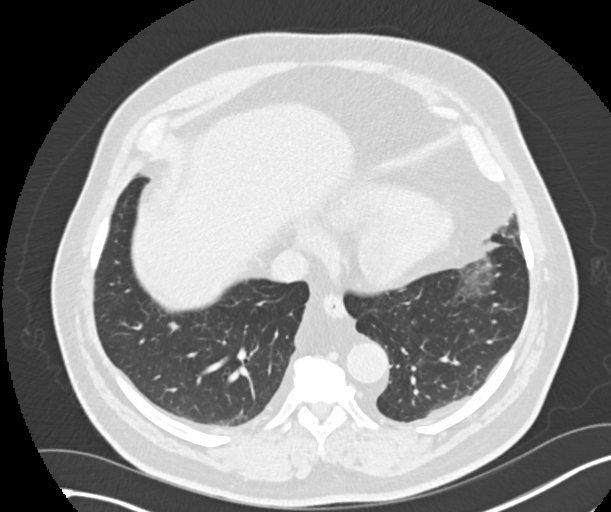
[im 60/156  lung]
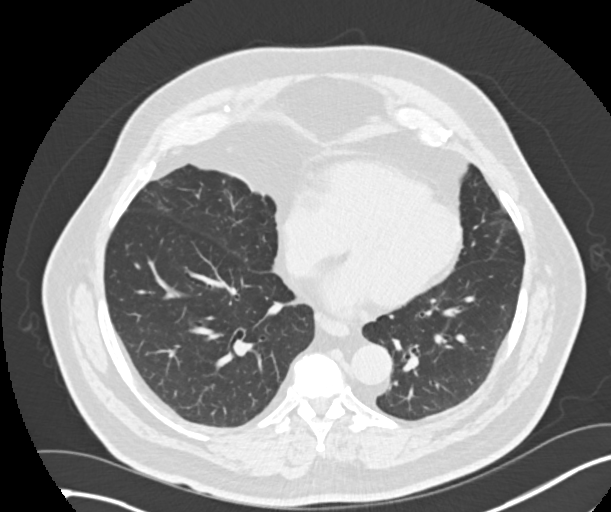
[im 84/156  mediastinal]
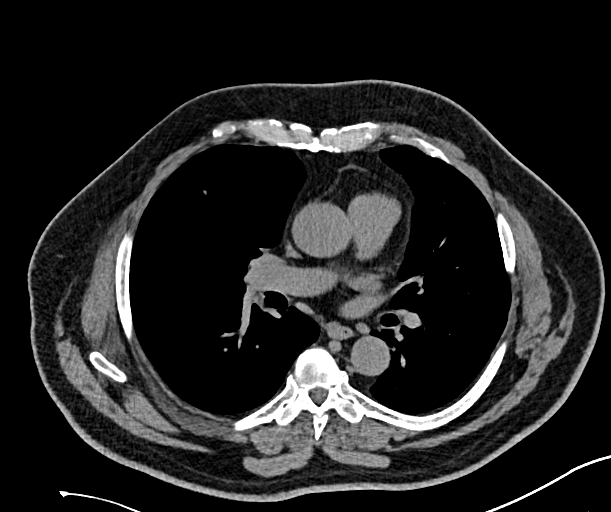
[im 84/156  lung]
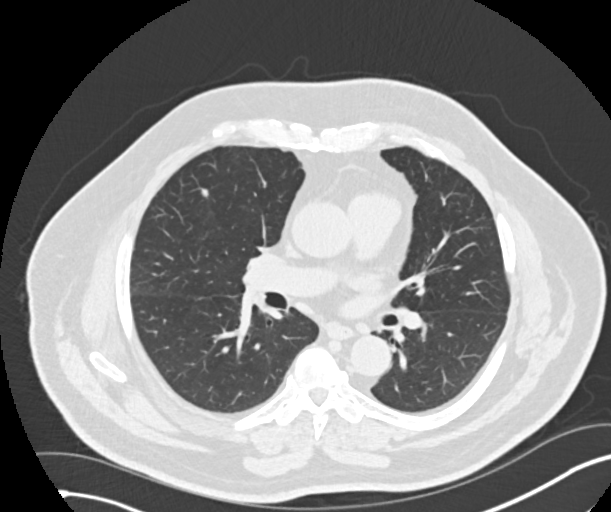
[im 96/156  lung]
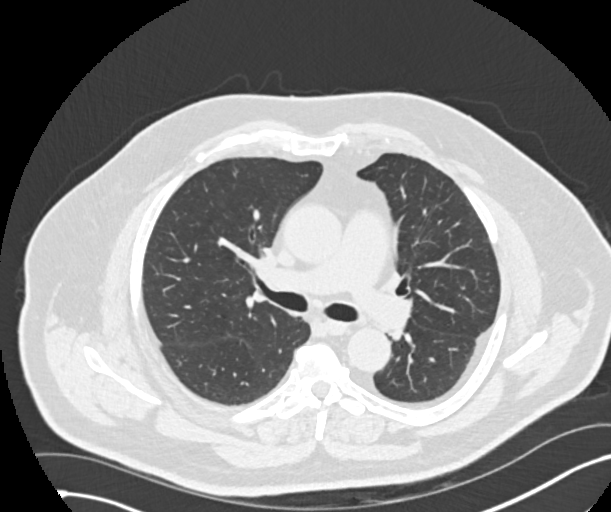
[im 108/156  lung]
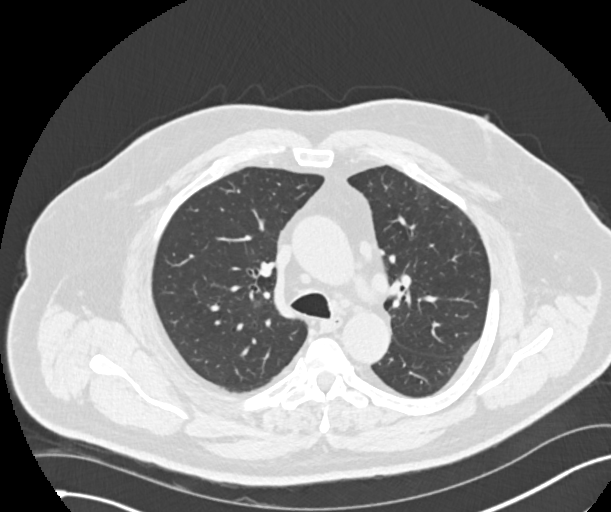
[im 132/156  lung]
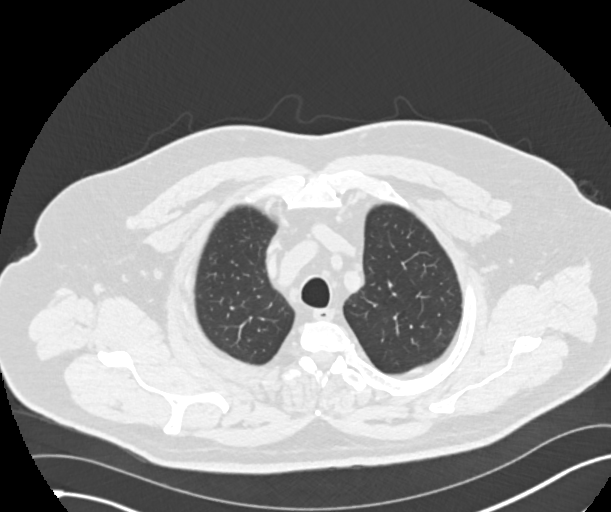
[im 144/156  mediastinal]
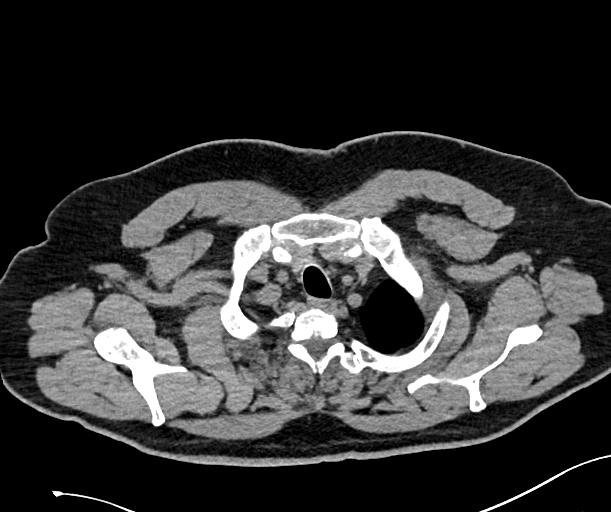
[im 144/156  lung]
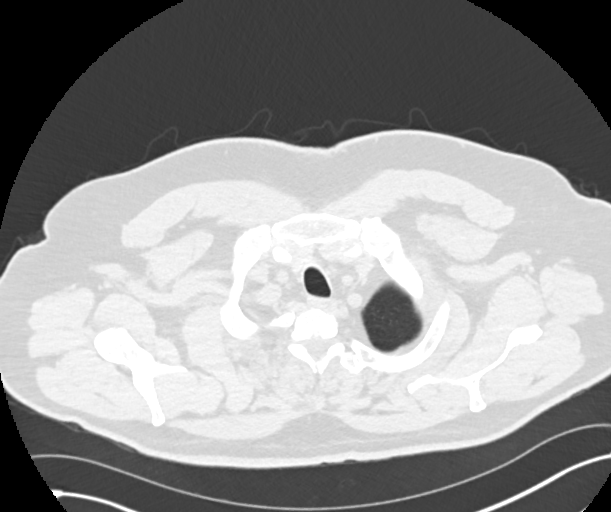

[Series 4: chest 2.00 br40 s3 · coronal · 0.61mm/px · 3 of 192 slices shown (2 of 2)]
[im 39/192  lung]
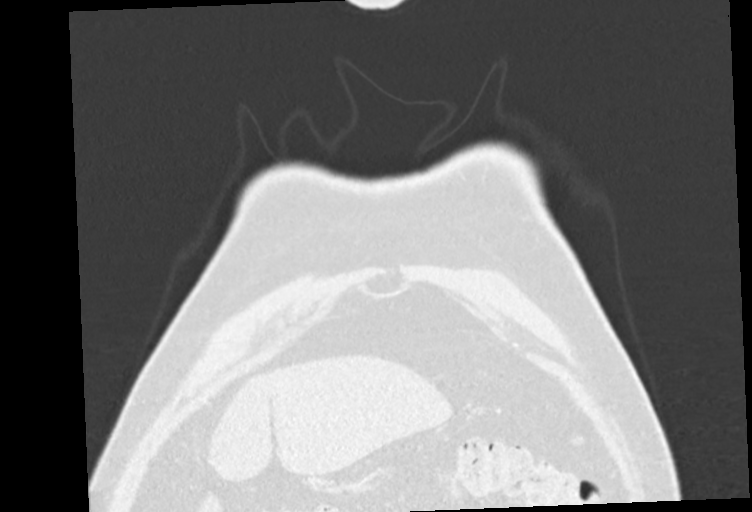
[im 77/192  lung]
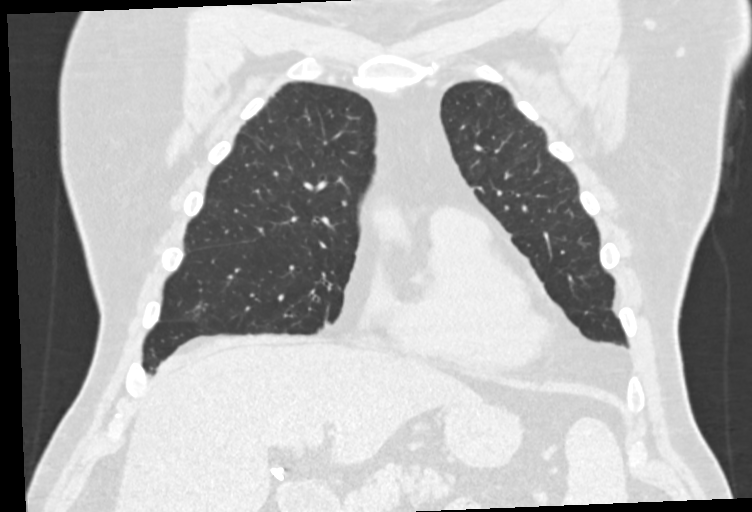
[im 115/192  lung]
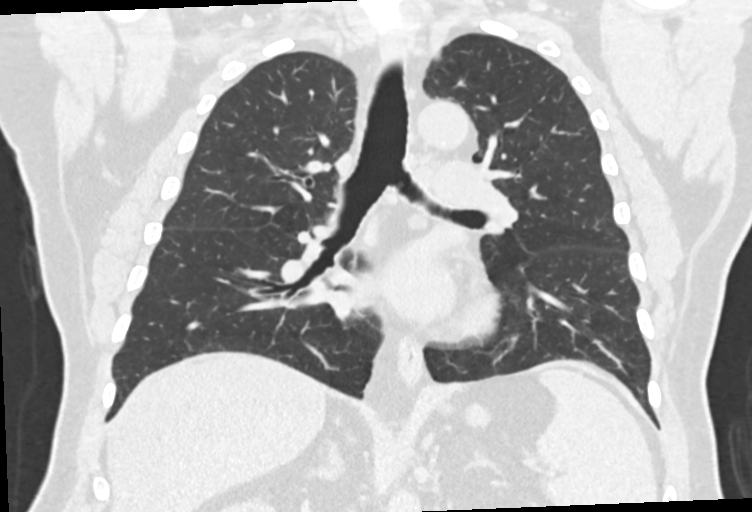

[12 of 36 positions shown; findings below may reference images not displayed]

FINDINGS: Cardiovascular: Atherosclerotic calcification of the aorta and
coronary arteries. Heart size normal. No pericardial effusion.
Pulmonic trunk is enlarged.

Mediastinum/Nodes: Mediastinal lymph nodes measure up to 11 mm in
the AP window. There are small hilar lymph nodes. Hilar regions are
otherwise difficult to definitively evaluate without IV contrast.
Right axillary lymph node measures 13 mm. Esophagus is unremarkable.

Lungs/Pleura: Mild paraseptal emphysema. Scattered pulmonary nodules
measure up to 5 mm (4 x 6 mm, 8/74) along the minor fissure. No
pleural fluid. Airway is unremarkable.

Upper Abdomen: Liver appears enlarged but is incompletely imaged.
Cholecystectomy. Nodular thickening of the adrenal glands
bilaterally. Visualized portion of the right kidney is unremarkable.
3.4 cm low-attenuation lesion in the left kidney is incompletely
imaged. Visualized portions of the spleen, pancreas, stomach and
bowel are unremarkable. Periportal lymph nodes measure up to 13 mm.

Musculoskeletal: Degenerative changes in the spine. No worrisome
lytic or sclerotic lesions.
IMPRESSION: 1. Scattered pulmonary nodules measure 5 mm or less in size.
Comparison with prior imaging would be helpful. In the absence of
prior imaging, no follow-up needed if patient is low-risk (and has
no known or suspected primary neoplasm). Non-contrast chest CT can
be considered in 12 months if patient is high-risk. This
recommendation follows the consensus statement: Guidelines for
Management of Incidental Pulmonary Nodules Detected on CT Images:
2. Small to borderline enlarged mediastinal, hilar, right axillary
and upper abdominal lymph nodes. Again, comparison with prior
imaging would be helpful. Otherwise, consider follow-up CT chest
with contrast in 3-6 months, as clinically indicated.
3. Liver appears enlarged but is incompletely imaged.
4. Aortic atherosclerosis (EPHHZ-JOX.X). Coronary artery
calcification.
5. Enlarged pulmonic trunk, indicative of pulmonary arterial
hypertension.
6.  Emphysema (EPHHZ-500.H).

## 2021-08-26 ENCOUNTER — Other Ambulatory Visit: Payer: Medicare HMO

## 2021-08-26 DIAGNOSIS — I10 Essential (primary) hypertension: Secondary | ICD-10-CM

## 2021-08-26 DIAGNOSIS — R739 Hyperglycemia, unspecified: Secondary | ICD-10-CM

## 2021-08-26 DIAGNOSIS — E7800 Pure hypercholesterolemia, unspecified: Secondary | ICD-10-CM

## 2021-08-29 ENCOUNTER — Encounter: Payer: Medicare HMO | Admitting: Orthopedic Surgery

## 2021-08-29 ENCOUNTER — Other Ambulatory Visit: Payer: Self-pay

## 2021-09-12 NOTE — Progress Notes (Signed)
This encounter was created in error - please disregard.
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
## 2021-10-06 NOTE — Patient Instructions (Addendum)
   Pneumonia and flu vaccines today.    Blood work was ordered.     Medications changes include :   trazodone 50 mg at night for sleep.  If this does not help let me know.  Start omeprazole 20 mg daily for possible heartburn.    Your prescription(s) have been submitted to your pharmacy. Please take as directed and contact our office if you believe you are having problem(s) with the medication(s).   Think about seeing  someone for your cpap.    Please followup in 6 months

## 2021-10-07 ENCOUNTER — Encounter: Payer: Self-pay | Admitting: Internal Medicine

## 2021-10-07 ENCOUNTER — Other Ambulatory Visit: Payer: Self-pay

## 2021-10-07 ENCOUNTER — Ambulatory Visit (INDEPENDENT_AMBULATORY_CARE_PROVIDER_SITE_OTHER): Payer: Medicare HMO | Admitting: Internal Medicine

## 2021-10-07 VITALS — BP 118/72 | HR 82 | Temp 97.9°F | Ht 66.0 in | Wt 275.2 lb

## 2021-10-07 DIAGNOSIS — I10 Essential (primary) hypertension: Secondary | ICD-10-CM | POA: Diagnosis not present

## 2021-10-07 DIAGNOSIS — G5712 Meralgia paresthetica, left lower limb: Secondary | ICD-10-CM | POA: Diagnosis not present

## 2021-10-07 DIAGNOSIS — R7303 Prediabetes: Secondary | ICD-10-CM | POA: Diagnosis not present

## 2021-10-07 DIAGNOSIS — G629 Polyneuropathy, unspecified: Secondary | ICD-10-CM | POA: Diagnosis not present

## 2021-10-07 DIAGNOSIS — K219 Gastro-esophageal reflux disease without esophagitis: Secondary | ICD-10-CM

## 2021-10-07 DIAGNOSIS — Z23 Encounter for immunization: Secondary | ICD-10-CM | POA: Diagnosis not present

## 2021-10-07 DIAGNOSIS — D751 Secondary polycythemia: Secondary | ICD-10-CM

## 2021-10-07 DIAGNOSIS — G479 Sleep disorder, unspecified: Secondary | ICD-10-CM | POA: Insufficient documentation

## 2021-10-07 LAB — CBC WITH DIFFERENTIAL/PLATELET
Basophils Absolute: 0.1 10*3/uL (ref 0.0–0.1)
Basophils Relative: 0.7 % (ref 0.0–3.0)
Eosinophils Absolute: 0.2 10*3/uL (ref 0.0–0.7)
Eosinophils Relative: 2.2 % (ref 0.0–5.0)
HCT: 50.5 % (ref 39.0–52.0)
Hemoglobin: 16.6 g/dL (ref 13.0–17.0)
Lymphocytes Relative: 21 % (ref 12.0–46.0)
Lymphs Abs: 1.7 10*3/uL (ref 0.7–4.0)
MCHC: 32.9 g/dL (ref 30.0–36.0)
MCV: 91.7 fl (ref 78.0–100.0)
Monocytes Absolute: 0.7 10*3/uL (ref 0.1–1.0)
Monocytes Relative: 8.9 % (ref 3.0–12.0)
Neutro Abs: 5.4 10*3/uL (ref 1.4–7.7)
Neutrophils Relative %: 67.2 % (ref 43.0–77.0)
Platelets: 129 10*3/uL — ABNORMAL LOW (ref 150.0–400.0)
RBC: 5.51 Mil/uL (ref 4.22–5.81)
RDW: 14.1 % (ref 11.5–15.5)
WBC: 8 10*3/uL (ref 4.0–10.5)

## 2021-10-07 LAB — COMPREHENSIVE METABOLIC PANEL
ALT: 23 U/L (ref 0–53)
AST: 20 U/L (ref 0–37)
Albumin: 4.1 g/dL (ref 3.5–5.2)
Alkaline Phosphatase: 57 U/L (ref 39–117)
BUN: 15 mg/dL (ref 6–23)
CO2: 28 mEq/L (ref 19–32)
Calcium: 8.9 mg/dL (ref 8.4–10.5)
Chloride: 104 mEq/L (ref 96–112)
Creatinine, Ser: 1.22 mg/dL (ref 0.40–1.50)
GFR: 57.56 mL/min — ABNORMAL LOW (ref >60.00)
Glucose, Bld: 118 mg/dL — ABNORMAL HIGH (ref 70–99)
Potassium: 4.2 mEq/L (ref 3.5–5.1)
Sodium: 140 mEq/L (ref 135–145)
Total Bilirubin: 0.6 mg/dL (ref 0.2–1.2)
Total Protein: 6.9 g/dL (ref 6.0–8.3)

## 2021-10-07 LAB — VITAMIN B12: Vitamin B-12: 349 pg/mL (ref 211–911)

## 2021-10-07 LAB — HEMOGLOBIN A1C: Hgb A1c MFr Bld: 6.3 % (ref 4.6–6.5)

## 2021-10-07 MED ORDER — TRAZODONE HCL 50 MG PO TABS: 50.0000 mg | ORAL_TABLET | Freq: Every evening | ORAL | 5 refills | Status: DC | PRN

## 2021-10-07 MED ORDER — OMEPRAZOLE 20 MG PO CPDR: 20.0000 mg | DELAYED_RELEASE_CAPSULE | Freq: Every day | ORAL | 5 refills | Status: DC

## 2021-10-07 NOTE — Assessment & Plan Note (Signed)
Chronic Has N/T in fingers/toes Check B12 level, a1c

## 2021-10-07 NOTE — Assessment & Plan Note (Signed)
Chronic Check a1c Low sugar / carb diet Stressed regular exercise  

## 2021-10-07 NOTE — Assessment & Plan Note (Signed)
Chronic Has been stable cbc

## 2021-10-07 NOTE — Assessment & Plan Note (Signed)
Chronic Has had issues with GERD in the past and he gets a tickle in his throat when he lays down at night that causes him to cough, which could be reflux Start omeprazole 20 mg daily-take 30 minutes prior to a meal-hopefully the tickle will improve with the medication

## 2021-10-07 NOTE — Assessment & Plan Note (Signed)
Subacute Has had difficulty with sleep Tickle in throat/cough is affecting his sleep - ? gerd - will treat Trial of trazodone 50 mg at night - if not effective can increase it to 100 mg or consider sonata Advised seeing pulmonary or neuro for further evaluation of his CPAP and how well it is working-he would be interested in inspire, but likely not a candidate based on his BMI

## 2021-10-07 NOTE — Assessment & Plan Note (Signed)
Chronic BP well controlled Continue metoprolol 12.5 mg bid cmp  

## 2021-10-07 NOTE — Assessment & Plan Note (Addendum)
Chronic Improved - mild symptoms  No current treatment

## 2022-01-19 ENCOUNTER — Encounter: Payer: Self-pay | Admitting: Internal Medicine

## 2022-01-19 NOTE — Progress Notes (Signed)
Subjective:    Patient ID: Justin Padilla, male    DOB: 02/21/1946, 76 y.o.   MRN: [id number]  This visit occurred during the SARS-CoV-2 public health emergency.  Safety protocols were in place, including screening questions prior to the visit, additional usage of staff PPE, and extensive cleaning of exam room while observing appropriate contact time as indicated for disinfecting solutions.    HPI Justin Padilla is here for  Chief Complaint  Patient presents with   Foot Swelling    Bilateral foot and ankle swelling (worse last week)     1 week of swelling in feet and ankles and hand - denies increased salt intake.  Has gained weight since he was here last but denies eating too much-denies any changes in how much she is eating.  He is not able to exercise, but is not new.  Has swelling in hands - in am can not make fist.  Has to put them in water for  a while first thing in the morning to help loosen them up so he can make a fist.   SOB with short distances-he thinks this may have gotten slightly worse.  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  He states he has not used his CPAP for 3 months-it dries out his mouth too much.    Medications and allergies reviewed with patient and updated if appropriate.  Current Outpatient Medications on File Prior to Visit  Medication Sig Dispense Refill   Acetaminophen (TYLENOL PO) Take 2 tablets by mouth as needed.     metoprolol tartrate (LOPRESSOR) 25 MG tablet Take 0.5 tablets (12.5 mg total) by mouth 2 (two) times daily. 90 tablet 2   Multiple Vitamins-Minerals (CENTRUM SILVER PO) Take 1 tablet by mouth daily.     omeprazole (PRILOSEC) 20 MG capsule Take 1 capsule (20 mg total) by mouth daily. Take 30 minutes prior to a meal 30 capsule 5   traZODone (DESYREL) 50 MG tablet Take 1 tablet (50 mg total) by mouth at bedtime as needed for sleep. 30 tablet 5   albuterol (VENTOLIN HFA) 108 (90 Base) MCG/ACT inhaler Inhale 2 puffs into the lungs every 6 (six)  hours as needed for wheezing or shortness of breath. (Patient not taking: Reported on 04/05/2021) 8 g 3   No current facility-administered medications on file prior to visit.    Review of Systems  Constitutional:  Negative for fever.  Respiratory:  Positive for cough (mild - prevents him from sleeping in bed - sleeps in recliner), shortness of breath (DOE - has gotten worse) and wheezing (occ in am - goes away after couging up some phlegm).   Cardiovascular:  Positive for leg swelling. Negative for chest pain and palpitations.  Gastrointestinal:  Negative for abdominal pain, blood in stool, constipation, diarrhea and nausea.       No gerd  Genitourinary:  Positive for frequency.  Musculoskeletal:  Negative for arthralgias.  Neurological:  Positive for numbness (tingling in hands). Negative for light-headedness and headaches.      Objective:   Vitals:   01/22/22 0903  BP: 130/72  Pulse: 65  Temp: 98.2 F (36.8 C)  SpO2: 92%   BP Readings from Last 3 Encounters:  01/22/22 130/72  10/07/21 118/72  04/05/21 120/78   Wt Readings from Last 3 Encounters:  01/22/22 288 lb (130.6 kg)  10/07/21 275 lb 3.2 oz (124.8 kg)  04/05/21 269 lb (122 kg)   Body mass index is 46.48 kg/m.  Physical Exam Constitutional:      General: He is not in acute distress.    Appearance: Normal appearance. He is not ill-appearing.  HENT:     Head: Normocephalic and atraumatic.  Eyes:     Conjunctiva/sclera: Conjunctivae normal.  Cardiovascular:     Rate and Rhythm: Normal rate and regular rhythm.     Heart sounds: Normal heart sounds. No murmur heard. Pulmonary:     Effort: Pulmonary effort is normal. No respiratory distress.     Breath sounds: Normal breath sounds. No wheezing or rales.  Musculoskeletal:        General: Swelling (mild b/l hands) present.     Right lower leg: Edema (1+) present.     Left lower leg: Edema (1+) present.  Skin:    General: Skin is warm and dry.     Findings:  No rash.  Neurological:     Mental Status: He is alert. Mental status is at baseline.  Psychiatric:        Mood and Affect: Mood normal.           Assessment & Plan:    See Problem List for Assessment and Plan of chronic medical problems.

## 2022-01-22 ENCOUNTER — Other Ambulatory Visit: Payer: Self-pay

## 2022-01-22 ENCOUNTER — Ambulatory Visit (INDEPENDENT_AMBULATORY_CARE_PROVIDER_SITE_OTHER): Payer: Medicare HMO | Admitting: Internal Medicine

## 2022-01-22 VITALS — BP 130/72 | HR 65 | Temp 98.2°F | Ht 66.0 in | Wt 288.0 lb

## 2022-01-22 DIAGNOSIS — R609 Edema, unspecified: Secondary | ICD-10-CM | POA: Insufficient documentation

## 2022-01-22 DIAGNOSIS — G4733 Obstructive sleep apnea (adult) (pediatric): Secondary | ICD-10-CM

## 2022-01-22 DIAGNOSIS — Z9989 Dependence on other enabling machines and devices: Secondary | ICD-10-CM | POA: Diagnosis not present

## 2022-01-22 DIAGNOSIS — R601 Generalized edema: Secondary | ICD-10-CM

## 2022-01-22 DIAGNOSIS — I10 Essential (primary) hypertension: Secondary | ICD-10-CM | POA: Diagnosis not present

## 2022-01-22 DIAGNOSIS — R635 Abnormal weight gain: Secondary | ICD-10-CM

## 2022-01-22 DIAGNOSIS — R7303 Prediabetes: Secondary | ICD-10-CM | POA: Diagnosis not present

## 2022-01-22 DIAGNOSIS — R0609 Other forms of dyspnea: Secondary | ICD-10-CM | POA: Diagnosis not present

## 2022-01-22 LAB — COMPREHENSIVE METABOLIC PANEL
ALT: 41 U/L (ref 0–53)
AST: 37 U/L (ref 0–37)
Albumin: 4.2 g/dL (ref 3.5–5.2)
Alkaline Phosphatase: 57 U/L (ref 39–117)
BUN: 17 mg/dL (ref 6–23)
CO2: 29 mEq/L (ref 19–32)
Calcium: 9.3 mg/dL (ref 8.4–10.5)
Chloride: 104 mEq/L (ref 96–112)
Creatinine, Ser: 1.26 mg/dL (ref 0.40–1.50)
GFR: 55.26 mL/min — ABNORMAL LOW (ref >60.00)
Glucose, Bld: 115 mg/dL — ABNORMAL HIGH (ref 70–99)
Potassium: 4.5 mEq/L (ref 3.5–5.1)
Sodium: 139 mEq/L (ref 135–145)
Total Bilirubin: 0.7 mg/dL (ref 0.2–1.2)
Total Protein: 6.8 g/dL (ref 6.0–8.3)

## 2022-01-22 LAB — CBC WITH DIFFERENTIAL/PLATELET
Basophils Absolute: 0 10*3/uL (ref 0.0–0.1)
Basophils Relative: 0.5 % (ref 0.0–3.0)
Eosinophils Absolute: 0.1 10*3/uL (ref 0.0–0.7)
Eosinophils Relative: 1.5 % (ref 0.0–5.0)
HCT: 51.5 % (ref 39.0–52.0)
Hemoglobin: 16.9 g/dL (ref 13.0–17.0)
Lymphocytes Relative: 19.9 % (ref 12.0–46.0)
Lymphs Abs: 1.7 10*3/uL (ref 0.7–4.0)
MCHC: 32.8 g/dL (ref 30.0–36.0)
MCV: 89.6 fl (ref 78.0–100.0)
Monocytes Absolute: 0.7 10*3/uL (ref 0.1–1.0)
Monocytes Relative: 7.9 % (ref 3.0–12.0)
Neutro Abs: 6 10*3/uL (ref 1.4–7.7)
Neutrophils Relative %: 70.2 % (ref 43.0–77.0)
Platelets: 125 10*3/uL — ABNORMAL LOW (ref 150.0–400.0)
RBC: 5.74 Mil/uL (ref 4.22–5.81)
RDW: 14.7 % (ref 11.5–15.5)
WBC: 8.5 10*3/uL (ref 4.0–10.5)

## 2022-01-22 LAB — HEMOGLOBIN A1C: Hgb A1c MFr Bld: 6.4 % (ref 4.6–6.5)

## 2022-01-22 LAB — TSH: TSH: 3.3 u[IU]/mL (ref 0.35–5.50)

## 2022-01-22 MED ORDER — HYDROCHLOROTHIAZIDE 12.5 MG PO TABS: 12.5000 mg | ORAL_TABLET | Freq: Every day | ORAL | 5 refills | Status: DC

## 2022-01-22 NOTE — Assessment & Plan Note (Signed)
Chronic ?Check a1c ?Low sugar / carb diet ?Stressed regular exercise-try to slowly increase activity ?

## 2022-01-22 NOTE — Assessment & Plan Note (Signed)
Chronic ?Since November 13 pound weight gain ?Not able to exercise ?States he is not eating too much and eating healthy ?Check TSH ?

## 2022-01-22 NOTE — Assessment & Plan Note (Signed)
Acute ?States 1 week of swelling in feet, ankles and hands ?Denies increased salt intake, changes in diet ?Has had weight gain over the past few months which could be contributing ?Not using CPAP regularly-stressed compliance ?Check CBC, CMP, TSH ?Need to rule out cardiac cause-echocardiogram ordered ?Start HCTZ 12.5 mg daily to see if that helps.  Discussed this may lower his blood pressure little bit and cause possible lightheadedness ?

## 2022-01-22 NOTE — Assessment & Plan Note (Signed)
Chronic BP well controlled Continue metoprolol 12.5 mg bid cmp  

## 2022-01-22 NOTE — Patient Instructions (Addendum)
? ? ? ?  Blood work was ordered.   ? ? ?Medications changes include :   hydrochlorothiazide 12.5 mg daily ? ? ?Your prescription(s) have been sent to your pharmacy.  ? ? ?An Echo or heart Ultrasound was ordered.   ? ?A referral was ordered for Cardiology.     Someone from that office will call you to schedule an appointment.  ? ? ?Return for follow up as scheduled. ? ?

## 2022-01-22 NOTE — Assessment & Plan Note (Addendum)
Chronic ?Has not used cpap x 3 months -- it dries his mouth out ?Stressed the importance of using the CPAP and potential consequences of not using it ?Discussed seeing pulmonary for further options regarding different masks to help him be more compliant with CPAP ?

## 2022-01-22 NOTE — Assessment & Plan Note (Signed)
States shortness of breath with exertion-is getting worse ?Has increased swelling, no chest pain or palpitations ?Likely multifactorial ?Obesity, deconditioning, chronic lung disease all likely contributing ?Stressed the use of CPAP consistently ?Need to rule out cardiac cause ?Echocardiogram, referral to cardiology ?CBC, CMP, TSH ?May need to consider medication to help with weight loss, nutrition referral-we will see what blood work shows ?

## 2022-01-28 ENCOUNTER — Encounter: Payer: Self-pay | Admitting: Internal Medicine

## 2022-01-28 NOTE — Progress Notes (Signed)
? ? ?Subjective:  ? ? Patient ID: Justin Padilla, male    DOB: 1945/12/05, 76 y.o.   MRN: [id number] ? ?This visit occurred during the SARS-CoV-2 public health emergency.  Safety protocols were in place, including screening questions prior to the visit, additional usage of staff PPE, and extensive cleaning of exam room while observing appropriate contact time as indicated for disinfecting solutions. ? ? ? ?HPI ?Shalev is here for  ?Chief Complaint  ?Patient presents with  ? Follow-up  ? ? ? ?He was here 3/15 for shortness of breath and leg swelling.  Had chronic shortness of breath, but it was not worse.  Has not used his CPAP for 3 months.  Echocardiogram ordered.  HCTZ 12.5 mg daily started.  Referral ordered for cardiology.  Echocardiogram scheduled for 3/23.  Cardiology visit scheduled for 3/30. ? ? ?He has eaten no bread, pasta or pizza in the past week - he has lost 11 lbs.  He is drinking water.  Drinking less gatorade 2.   ? ?Swelling is slightly better.  ? ?Has redness on medial left foot - no pain or itching. Denies injury ? ? ?Medications and allergies reviewed with patient and updated if appropriate. ? ?Current Outpatient Medications on File Prior to Visit  ?Medication Sig Dispense Refill  ? Acetaminophen (TYLENOL PO) Take 2 tablets by mouth as needed.    ? hydrochlorothiazide (HYDRODIURIL) 12.5 MG tablet Take 1 tablet (12.5 mg total) by mouth daily. 30 tablet 5  ? metoprolol tartrate (LOPRESSOR) 25 MG tablet Take 0.5 tablets (12.5 mg total) by mouth 2 (two) times daily. 90 tablet 2  ? Multiple Vitamins-Minerals (CENTRUM SILVER PO) Take 1 tablet by mouth daily.    ? omeprazole (PRILOSEC) 20 MG capsule Take 1 capsule (20 mg total) by mouth daily. Take 30 minutes prior to a meal 30 capsule 5  ? ?No current facility-administered medications on file prior to visit.  ? ? ?Review of Systems  ?Constitutional:  Negative for fever.  ?Respiratory:  Negative for cough, shortness of breath and wheezing.    ?Cardiovascular:  Positive for leg swelling. Negative for chest pain and palpitations.  ?Neurological:  Negative for light-headedness and headaches.  ? ?   ?Objective:  ? ?Vitals:  ? 01/29/22 1317  ?BP: 136/80  ?Pulse: 75  ?Temp: 98.2 ?F (36.8 ?C)  ?SpO2: 96%  ? ?BP Readings from Last 3 Encounters:  ?01/29/22 136/80  ?01/22/22 130/72  ?10/07/21 118/72  ? ?Wt Readings from Last 3 Encounters:  ?01/29/22 277 lb (125.6 kg)  ?01/22/22 288 lb (130.6 kg)  ?10/07/21 275 lb 3.2 oz (124.8 kg)  ? ?Body mass index is 44.71 kg/m?. ? ?  ?Physical Exam ?Constitutional:   ?   General: He is not in acute distress. ?   Appearance: Normal appearance. He is not ill-appearing.  ?HENT:  ?   Head: Normocephalic and atraumatic.  ?Eyes:  ?   Conjunctiva/sclera: Conjunctivae normal.  ?Cardiovascular:  ?   Rate and Rhythm: Normal rate and regular rhythm.  ?   Heart sounds: Normal heart sounds. No murmur heard. ?Pulmonary:  ?   Effort: Pulmonary effort is normal. No respiratory distress.  ?   Breath sounds: Normal breath sounds. No wheezing or rales.  ?Musculoskeletal:  ?   Right lower leg: No edema.  ?   Left lower leg: No edema.  ?Skin: ?   General: Skin is warm and dry.  ?   Findings: No rash.  ?   Comments: Erythema  left medial foot - looks like a bruise - nontender, no breaks in skin  ?Neurological:  ?   Mental Status: He is alert. Mental status is at baseline.  ?Psychiatric:     ?   Mood and Affect: Mood normal.  ? ?   ? ? ? ? ? ?Assessment & Plan:  ? ? ?See Problem List for Assessment and Plan of chronic medical problems.  ? ? ? ? ?

## 2022-01-29 ENCOUNTER — Other Ambulatory Visit: Payer: Self-pay

## 2022-01-29 ENCOUNTER — Ambulatory Visit (INDEPENDENT_AMBULATORY_CARE_PROVIDER_SITE_OTHER): Payer: Medicare HMO | Admitting: Internal Medicine

## 2022-01-29 DIAGNOSIS — I10 Essential (primary) hypertension: Secondary | ICD-10-CM | POA: Diagnosis not present

## 2022-01-29 DIAGNOSIS — R601 Generalized edema: Secondary | ICD-10-CM | POA: Diagnosis not present

## 2022-01-29 NOTE — Assessment & Plan Note (Addendum)
Subacute ?Was having increased leg swelling ?Has chronic shortness of breath on exertion ?Echocardiogram scheduled,  cardiology appointment scheduled ?Started HCTZ 12.5 mg daily last week ?Swelling improved - that is probably some of his weight loss  ?Continue weight loss efforts ?

## 2022-01-29 NOTE — Assessment & Plan Note (Signed)
Chronic ?HCTZ 12.5 mg daily was started a week ago for leg swelling ?Blood pressure well controlled ?BMP ?Continue metoprolol 12.5 mg twice daily, HCTZ 12.5 mg daily ?

## 2022-01-29 NOTE — Patient Instructions (Addendum)
        Medications changes include :   none      Return for follow up as scheduled.  

## 2022-01-30 ENCOUNTER — Ambulatory Visit (HOSPITAL_COMMUNITY): Payer: Medicare HMO | Attending: Cardiovascular Disease

## 2022-01-30 DIAGNOSIS — R0609 Other forms of dyspnea: Secondary | ICD-10-CM | POA: Insufficient documentation

## 2022-01-30 LAB — ECHOCARDIOGRAM COMPLETE
Area-P 1/2: 2.63 cm2
P 1/2 time: 396 msec
S' Lateral: 2.8 cm

## 2022-02-06 ENCOUNTER — Ambulatory Visit: Payer: Medicare HMO | Admitting: Internal Medicine

## 2022-02-06 ENCOUNTER — Encounter: Payer: Self-pay | Admitting: Internal Medicine

## 2022-02-06 VITALS — BP 134/79 | HR 63 | Ht 66.0 in | Wt 278.0 lb

## 2022-02-06 DIAGNOSIS — I10 Essential (primary) hypertension: Secondary | ICD-10-CM

## 2022-02-06 DIAGNOSIS — R601 Generalized edema: Secondary | ICD-10-CM | POA: Diagnosis not present

## 2022-02-06 DIAGNOSIS — R0609 Other forms of dyspnea: Secondary | ICD-10-CM

## 2022-02-06 DIAGNOSIS — I4510 Unspecified right bundle-branch block: Secondary | ICD-10-CM

## 2022-02-06 DIAGNOSIS — I712 Thoracic aortic aneurysm, without rupture, unspecified: Secondary | ICD-10-CM

## 2022-02-06 DIAGNOSIS — I5032 Chronic diastolic (congestive) heart failure: Secondary | ICD-10-CM | POA: Diagnosis not present

## 2022-02-06 DIAGNOSIS — I700 Atherosclerosis of aorta: Secondary | ICD-10-CM

## 2022-02-06 DIAGNOSIS — I7121 Aneurysm of the ascending aorta, without rupture: Secondary | ICD-10-CM | POA: Insufficient documentation

## 2022-02-06 NOTE — Patient Instructions (Signed)
Medication Instructions:  ?Your physician recommends that you continue on your current medications as directed. Please refer to the Current Medication list given to you today. ? ?*If you need a refill on your cardiac medications before your next appointment, please call your pharmacy* ? ? ?Lab Work: ?TODAY: BMP, BNP ?If you have labs (blood work) drawn today and your tests are completely normal, you will receive your results only by: ?MyChart Message (if you have MyChart) OR ?A paper copy in the mail ?If you have any lab test that is abnormal or we need to change your treatment, we will call you to review the results. ? ? ?Testing/Procedures: ?NONE ? ? ?Follow-Up: ?At [facility], you and your health needs are our priority.  As part of our continuing mission to provide you with exceptional heart care, we have created designated Provider Care Teams.  These Care Teams include your primary Cardiologist (physician) and Advanced Practice Providers (APPs -  Physician Assistants and Nurse Practitioners) who all work together to provide you with the care you need, when you need it. ? ? ?Your next appointment:   ?5-6  month(s) ? ?The format for your next appointment:   ?In Person ? ?Provider:   ?Werner Lean, MD   ?
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
## 2022-02-07 LAB — BASIC METABOLIC PANEL
BUN/Creatinine Ratio: 10 (ref 10–24)
BUN: 13 mg/dL (ref 8–27)
CO2: 23 mmol/L (ref 20–29)
Calcium: 9 mg/dL (ref 8.6–10.2)
Chloride: 102 mmol/L (ref 96–106)
Creatinine, Ser: 1.27 mg/dL (ref 0.76–1.27)
Glucose: 117 mg/dL — ABNORMAL HIGH (ref 70–99)
Potassium: 4.4 mmol/L (ref 3.5–5.2)
Sodium: 140 mmol/L (ref 134–144)
eGFR: 59 mL/min/{1.73_m2} — ABNORMAL LOW (ref >59)

## 2022-02-07 LAB — PRO B NATRIURETIC PEPTIDE: NT-Pro BNP: 62 pg/mL (ref 0–486)

## 2022-02-20 ENCOUNTER — Encounter: Payer: Self-pay | Admitting: Internal Medicine

## 2022-03-17 ENCOUNTER — Encounter: Payer: Self-pay | Admitting: Internal Medicine

## 2022-03-20 ENCOUNTER — Other Ambulatory Visit: Payer: Self-pay | Admitting: Internal Medicine

## 2022-03-21 ENCOUNTER — Telehealth: Payer: Self-pay | Admitting: Pulmonary Disease

## 2022-03-21 DIAGNOSIS — I712 Thoracic aortic aneurysm, without rupture, unspecified: Secondary | ICD-10-CM

## 2022-03-21 NOTE — Telephone Encounter (Signed)
I see there is results from last year for a CT scan and he has not been seen since 2021. Does he need an OV to get the CT scan? Please advise.  ?

## 2022-03-24 ENCOUNTER — Ambulatory Visit
Admission: RE | Admit: 2022-03-24 | Discharge: 2022-03-24 | Disposition: A | Discharge status: Home / Self Care | Payer: Medicare HMO | Source: Ambulatory Visit | Attending: Internal Medicine | Admitting: Internal Medicine

## 2022-03-24 ENCOUNTER — Telehealth: Payer: Self-pay

## 2022-03-24 ENCOUNTER — Other Ambulatory Visit: Payer: Self-pay

## 2022-03-24 DIAGNOSIS — R918 Other nonspecific abnormal finding of lung field: Secondary | ICD-10-CM

## 2022-03-24 NOTE — Telephone Encounter (Signed)
He will need the CTA for ascending thoracic aorta aneurysm. So can we reorder it. ?And yes he will need a follow up in clinic as he has not been seen since 2021 ?

## 2022-03-24 NOTE — Telephone Encounter (Addendum)
Spoke to [facility] we have cancelled this patient's CTA today because they are not able to do a CTA for pulmonary nodules.  They only do CTAs for PEs.  Does the pt need a CTA or should it just be a CT?  ?

## 2022-03-24 NOTE — Telephone Encounter (Signed)
[facility] called in refernce to CTA ordered today. Order should have been for [facility]. [facility] disconnected before I could changed order.   ?

## 2022-03-24 NOTE — Telephone Encounter (Signed)
I have placed order for CTA for thoracic aorta aneurysm with note to see Dr Vaughan Browner after.  ?

## 2022-03-24 NOTE — Telephone Encounter (Signed)
Dr Vaughan Browner- can we change order to CT Chest without?  ?

## 2022-03-26 NOTE — Telephone Encounter (Signed)
I have it printed and will call to get it scheduled as well as the follow up. Nothing further needed at this time  ?

## 2022-04-08 ENCOUNTER — Ambulatory Visit: Payer: Medicare HMO | Admitting: Internal Medicine

## 2022-04-15 ENCOUNTER — Other Ambulatory Visit: Payer: Medicare HMO

## 2022-04-17 ENCOUNTER — Inpatient Hospital Stay: Admission: RE | Admit: 2022-04-17 | Payer: Medicare HMO | Source: Ambulatory Visit

## 2022-04-28 ENCOUNTER — Ambulatory Visit
Admission: RE | Admit: 2022-04-28 | Discharge: 2022-04-28 | Disposition: A | Discharge status: Home / Self Care | Payer: Medicare HMO | Source: Ambulatory Visit | Attending: Pulmonary Disease | Admitting: Pulmonary Disease

## 2022-04-28 DIAGNOSIS — J984 Other disorders of lung: Secondary | ICD-10-CM | POA: Diagnosis not present

## 2022-04-28 DIAGNOSIS — I2510 Atherosclerotic heart disease of native coronary artery without angina pectoris: Secondary | ICD-10-CM | POA: Diagnosis not present

## 2022-04-28 DIAGNOSIS — I712 Thoracic aortic aneurysm, without rupture, unspecified: Secondary | ICD-10-CM

## 2022-04-28 DIAGNOSIS — R911 Solitary pulmonary nodule: Secondary | ICD-10-CM | POA: Diagnosis not present

## 2022-04-28 MED ORDER — IOPAMIDOL (ISOVUE-370) INJECTION 76%
75.0000 mL | Freq: Once | INTRAVENOUS | Status: AC | PRN
  Administered 2022-04-28: 75 mL via INTRAVENOUS

## 2022-05-01 ENCOUNTER — Ambulatory Visit: Payer: Medicare HMO | Admitting: Pulmonary Disease

## 2022-05-01 ENCOUNTER — Encounter: Payer: Self-pay | Admitting: Pulmonary Disease

## 2022-05-01 VITALS — BP 124/60 | HR 58 | Temp 97.6°F | Ht 66.0 in | Wt 257.6 lb

## 2022-05-01 DIAGNOSIS — I712 Thoracic aortic aneurysm, without rupture, unspecified: Secondary | ICD-10-CM | POA: Diagnosis not present

## 2022-05-01 DIAGNOSIS — I719 Aortic aneurysm of unspecified site, without rupture: Secondary | ICD-10-CM | POA: Diagnosis not present

## 2022-05-01 DIAGNOSIS — R918 Other nonspecific abnormal finding of lung field: Secondary | ICD-10-CM | POA: Diagnosis not present

## 2022-05-01 NOTE — Patient Instructions (Signed)
I reviewed his CT which shows stable lung nodules and aortic aneurysm. We will order repeat CTA for aortic aneurysm follow-up in 1 year Return to clinic in 1 year

## 2022-05-01 NOTE — Progress Notes (Signed)
Justin Padilla    [id number]    [birth year]  Primary Care Physician:Burns, Claudina Lick, MD  Referring Physician: Binnie Rail, MD [place],  [place] [address]  Chief complaint: Follow up for lung nodule  HPI: 76 y.o. with OSA, polycythemia, pulmonary nodules He is moved here recently from [place].  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  Complains of mild dyspnea on exertion, chronic cough with white mucus.  No wheezing or fevers or chills  Pets: No pets Occupation: Used to work in tile, Medical sales representative, and stone cutting Exposures: Exposed to dust as noted above.  No mold, hot tub, Jacuzzi.  No feather pillows or comforters Smoking history: 40-pack-year smoker.  Quit in 2009 Travel history: Originally from [place].  Previously lived in [place] Relevant family history: No significant family history of lung disease  Interim history:  He is here for follow-up of CT scan.  No respiratory complaints.  He is trying to lose weight with diet and exercise  Outpatient Encounter Medications as of 05/01/2022  Medication Sig   Acetaminophen (TYLENOL PO) Take 2 tablets by mouth as needed.   hydrochlorothiazide (HYDRODIURIL) 12.5 MG tablet Take 1 tablet (12.5 mg total) by mouth daily.   metoprolol tartrate (LOPRESSOR) 25 MG tablet Take 0.5 tablets (12.5 mg total) by mouth 2 (two) times daily.   Multiple Vitamins-Minerals (CENTRUM SILVER PO) Take 1 tablet by mouth daily.   omeprazole (PRILOSEC) 20 MG capsule Take 1 capsule (20 mg total) by mouth daily. Take 30 minutes prior to a meal   No facility-administered encounter medications on file as of 05/01/2022.    Allergies as of 05/01/2022 - Review Complete 05/01/2022  Allergen Reaction Noted   Trazodone and nefazodone Other (See Comments) 01/22/2022    Physical Exam: Blood pressure 124/60, pulse (!)  58, temperature 97.6 F (36.4 C), temperature source Oral, height '5\' 6"'$  (1.676 m), weight 257 lb 9.6 oz (116.8 kg), SpO2 96 %. Gen:      No acute distress HEENT:  EOMI, sclera anicteric Neck:     No masses; no thyromegaly Lungs:    Clear to auscultation bilaterally; normal respiratory effort CV:         Regular rate and rhythm; no murmurs Abd:      + bowel sounds; soft, non-tender; no palpable masses, no distension Ext:    No edema; adequate peripheral perfusion Skin:      Warm and dry; no rash Neuro: alert and oriented x 3 Psych: normal mood and affect   Data Reviewed: Imaging: CT chest 09/03/2018 report from care everywhere 1. THERE ARE MULTIPLE BILATERAL PULMONARY NODULES. MOST OF THESE ARE STABLE SINCE PRIOR STUDY FROM 11/25/2012. HOWEVER, THERE ARE 2 NEW NODULES. THERE IS A NEW NODULE IN THE LEFT UPPER LOBE MEASURING 4 MM ON IMAGE #22. THERE IS A NEW NODULE IN THE LINGULA MEASURING 5 MM.  2. SUBCENTIMETER MEDIASTINAL LYMPH NODES.  3. THE THORACIC AORTA IS ECTATIC MEASURING 4.2 CM AT ITS ASCENDING SEGMENT.    CT chest 11/07/2019 report from care everywhere BILATERAL PULMONARY NODULES: SOME ARE STABLE, SOME HAVE RESOLVED, AND SOME ARE NEW. THIS SUGGESTS AN INFLAMMATORY/INFECTIOUS PROCESS. RECOMMEND FOLLOW-UP IN 6 MONTHS.   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  The nodules are tiny and likely benign.  Although he has a history of scoliosis I do not see any  evidence of this on his CTs  Follow-up CT noted with stable lung nodules.  Emphysema, ex-smoker No significant obstruction on spirometry.  His PFTs are normal Since he is asymptomatic we will observe off inhalers He is working on weight loss which will help with his breathing  Ascending aortic aneurysm Order follow-up CTA in 1 year  Plan/Recommendations: Follow-up CT in 1 year  Marshell Garfinkel MD [facility] 05/01/2022, 8:54 AM  CC: Binnie Rail, MD

## 2022-05-10 ENCOUNTER — Other Ambulatory Visit: Payer: Self-pay | Admitting: Internal Medicine

## 2022-07-09 ENCOUNTER — Ambulatory Visit: Payer: Medicare HMO | Admitting: Internal Medicine

## 2022-07-30 ENCOUNTER — Encounter: Payer: Self-pay | Admitting: Internal Medicine

## 2022-07-30 NOTE — Progress Notes (Unsigned)
Subjective:    Patient ID: Justin Padilla, male    DOB: [birth year], 76 y.o.   MRN: [id number]     HPI Gorje is here for follow up of his chronic medical problems, including htn, , prediabetes, edema, HFpEF, DOE, GERD  He has lost 40 lbs since March.    Fingers in his left hand tingle.  Not into hand. No weakness or pain.  Mostly the three middle fingers - the thumb and pinky to a lesser degree.  No neck issues.    Goes to gym 3/ week.    Medications and allergies reviewed with patient and updated if appropriate.  Current Outpatient Medications on File Prior to Visit  Medication Sig Dispense Refill   Acetaminophen (TYLENOL PO) Take 2 tablets by mouth as needed.     hydrochlorothiazide (HYDRODIURIL) 12.5 MG tablet Take 1 tablet (12.5 mg total) by mouth daily. 30 tablet 5   metoprolol tartrate (LOPRESSOR) 25 MG tablet Take 1/2 (one-half) tablet by mouth twice daily 90 tablet 0   Multiple Vitamins-Minerals (CENTRUM SILVER PO) Take 1 tablet by mouth daily.     omeprazole (PRILOSEC) 20 MG capsule Take 1 capsule (20 mg total) by mouth daily. Take 30 minutes prior to a meal 30 capsule 5   No current facility-administered medications on file prior to visit.     Review of Systems  Constitutional:  Negative for fever.  Respiratory:  Positive for shortness of breath (improved but chronic). Negative for cough and wheezing.   Cardiovascular:  Positive for leg swelling (improved). Negative for chest pain and palpitations.  Neurological:  Negative for light-headedness and headaches.       Objective:   Vitals:   07/31/22 1021  BP: 118/60  Pulse: 65  Temp: 98.3 F (36.8 C)  SpO2: 98%   BP Readings from Last 3 Encounters:  07/31/22 118/60  05/01/22 124/60  02/06/22 134/79   Wt Readings from Last 3 Encounters:  07/31/22 248 lb (112.5 kg)  05/01/22 257 lb 9.6 oz (116.8 kg)  02/06/22 278 lb (126.1 kg)   Body mass index is 40.03 kg/m.    Physical  Exam Constitutional:      General: He is not in acute distress.    Appearance: Normal appearance. He is not ill-appearing.  HENT:     Head: Normocephalic and atraumatic.  Eyes:     Conjunctiva/sclera: Conjunctivae normal.  Cardiovascular:     Rate and Rhythm: Normal rate and regular rhythm.     Heart sounds: Normal heart sounds. No murmur heard. Pulmonary:     Effort: Pulmonary effort is normal. No respiratory distress.     Breath sounds: Normal breath sounds. No wheezing or rales.  Musculoskeletal:     Right lower leg: No edema.     Left lower leg: No edema.  Skin:    General: Skin is warm and dry.     Findings: No rash.  Neurological:     Mental Status: He is alert. Mental status is at baseline.  Psychiatric:        Mood and Affect: Mood normal.        Lab Results  Component Value Date   WBC 8.5 01/22/2022   HGB 16.9 01/22/2022   HCT 51.5 01/22/2022   PLT 125.0 (L) 01/22/2022   GLUCOSE 117 (H) 02/06/2022   CHOL 122 02/26/2021   TRIG 112 02/26/2021   HDL 35 (L) 02/26/2021   LDLCALC 67 02/26/2021   ALT 41 01/22/2022  AST 37 01/22/2022   NA 140 02/06/2022   K 4.4 02/06/2022   CL 102 02/06/2022   CREATININE 1.27 02/06/2022   BUN 13 02/06/2022   CO2 23 02/06/2022   TSH 3.30 01/22/2022   PSA 3.26 11/07/2019   HGBA1C 6.4 01/22/2022     Assessment & Plan:    See Problem List for Assessment and Plan of chronic medical problems.

## 2022-07-30 NOTE — Patient Instructions (Addendum)
    Flu vaccine given today   Blood work was ordered.     Medications changes include :   none   Your prescription(s) have been sent to your pharmacy.     Return in about 6 months (around 01/29/2023) for Physical Exam.

## 2022-07-31 ENCOUNTER — Ambulatory Visit (INDEPENDENT_AMBULATORY_CARE_PROVIDER_SITE_OTHER): Payer: Medicare HMO | Admitting: Internal Medicine

## 2022-07-31 ENCOUNTER — Other Ambulatory Visit (INDEPENDENT_AMBULATORY_CARE_PROVIDER_SITE_OTHER): Payer: Medicare HMO

## 2022-07-31 VITALS — BP 118/60 | HR 65 | Temp 98.3°F | Ht 66.0 in | Wt 248.0 lb

## 2022-07-31 DIAGNOSIS — I10 Essential (primary) hypertension: Secondary | ICD-10-CM | POA: Diagnosis not present

## 2022-07-31 DIAGNOSIS — R7303 Prediabetes: Secondary | ICD-10-CM | POA: Diagnosis not present

## 2022-07-31 DIAGNOSIS — R202 Paresthesia of skin: Secondary | ICD-10-CM | POA: Diagnosis not present

## 2022-07-31 DIAGNOSIS — R601 Generalized edema: Secondary | ICD-10-CM

## 2022-07-31 DIAGNOSIS — I5032 Chronic diastolic (congestive) heart failure: Secondary | ICD-10-CM

## 2022-07-31 DIAGNOSIS — K219 Gastro-esophageal reflux disease without esophagitis: Secondary | ICD-10-CM | POA: Diagnosis not present

## 2022-07-31 DIAGNOSIS — R0609 Other forms of dyspnea: Secondary | ICD-10-CM | POA: Diagnosis not present

## 2022-07-31 DIAGNOSIS — Z23 Encounter for immunization: Secondary | ICD-10-CM | POA: Diagnosis not present

## 2022-07-31 LAB — LIPID PANEL
Cholesterol: 121 mg/dL (ref 0–200)
HDL: 39.6 mg/dL (ref >39.00)
LDL Cholesterol: 65 mg/dL (ref 0–99)
NonHDL: 80.91
Total CHOL/HDL Ratio: 3
Triglycerides: 79 mg/dL (ref 0.0–149.0)
VLDL: 15.8 mg/dL (ref 0.0–40.0)

## 2022-07-31 LAB — COMPREHENSIVE METABOLIC PANEL
ALT: 23 U/L (ref 0–53)
AST: 22 U/L (ref 0–37)
Albumin: 4.1 g/dL (ref 3.5–5.2)
Alkaline Phosphatase: 65 U/L (ref 39–117)
BUN: 23 mg/dL (ref 6–23)
CO2: 27 mEq/L (ref 19–32)
Calcium: 9.3 mg/dL (ref 8.4–10.5)
Chloride: 104 mEq/L (ref 96–112)
Creatinine, Ser: 1.08 mg/dL (ref 0.40–1.50)
GFR: 66.24 mL/min (ref >60.00)
Glucose, Bld: 93 mg/dL (ref 70–99)
Potassium: 5 mEq/L (ref 3.5–5.1)
Sodium: 137 mEq/L (ref 135–145)
Total Bilirubin: 0.6 mg/dL (ref 0.2–1.2)
Total Protein: 7.3 g/dL (ref 6.0–8.3)

## 2022-07-31 LAB — CBC WITH DIFFERENTIAL/PLATELET
Basophils Absolute: 0 10*3/uL (ref 0.0–0.1)
Basophils Relative: 0.5 % (ref 0.0–3.0)
Eosinophils Absolute: 0.1 10*3/uL (ref 0.0–0.7)
Eosinophils Relative: 1.1 % (ref 0.0–5.0)
HCT: 50.7 % (ref 39.0–52.0)
Hemoglobin: 16.9 g/dL (ref 13.0–17.0)
Lymphocytes Relative: 30.8 % (ref 12.0–46.0)
Lymphs Abs: 2.1 10*3/uL (ref 0.7–4.0)
MCHC: 33.4 g/dL (ref 30.0–36.0)
MCV: 90 fl (ref 78.0–100.0)
Monocytes Absolute: 0.5 10*3/uL (ref 0.1–1.0)
Monocytes Relative: 7.9 % (ref 3.0–12.0)
Neutro Abs: 4 10*3/uL (ref 1.4–7.7)
Neutrophils Relative %: 59.7 % (ref 43.0–77.0)
Platelets: 114 10*3/uL — ABNORMAL LOW (ref 150.0–400.0)
RBC: 5.64 Mil/uL (ref 4.22–5.81)
RDW: 15.2 % (ref 11.5–15.5)
WBC: 6.8 10*3/uL (ref 4.0–10.5)

## 2022-07-31 MED ORDER — HYDROCHLOROTHIAZIDE 12.5 MG PO TABS: 12.5000 mg | ORAL_TABLET | Freq: Every day | ORAL | 2 refills | Status: DC

## 2022-07-31 MED ORDER — METOPROLOL TARTRATE 25 MG PO TABS: ORAL_TABLET | ORAL | 2 refills | Status: DC

## 2022-07-31 NOTE — Assessment & Plan Note (Signed)
Chronic GERD controlled Continue omeprazole 20 mg daily  

## 2022-07-31 NOTE — Assessment & Plan Note (Signed)
Chronic Improved, but still with swelling Continue Hydrochlorothiazide 12.5 mg daily Stressed low-sodium diet, regular exercise, weight loss, elevating legs when sitting

## 2022-07-31 NOTE — Assessment & Plan Note (Signed)
New Mostly the 3 middle fingers - to a lesser degree the thumb and pinky ? Cervical or wrist nerve pinching Discussed ortho referral -defered for now He will monitor

## 2022-07-31 NOTE — Assessment & Plan Note (Signed)
Chronic Check a1c Low sugar / carb diet Stressed regular exercise  

## 2022-07-31 NOTE — Assessment & Plan Note (Signed)
Chronic Blood pressure well controlled CMP Continue HCTZ 12.5 mg daily, metoprolol 12.5 mg twice daily

## 2022-07-31 NOTE — Assessment & Plan Note (Addendum)
Chronic Following with cardiology Has had some improvement Likely multifactorial-obesity, deconditioning, diastolic heart failure, chronic lung disease Using CPAP nightly Exercising regularly

## 2022-07-31 NOTE — Assessment & Plan Note (Signed)
Chronic Edema controlled Continue hydrochlorothiazide 12.5 mg daily

## 2022-08-02 ENCOUNTER — Encounter: Payer: Self-pay | Admitting: Internal Medicine

## 2022-08-04 LAB — HEMOGLOBIN A1C: Hgb A1c MFr Bld: 5.9 % (ref 4.6–6.5)
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
## 2022-08-20 NOTE — Patient Instructions (Signed)
Health Maintenance, Male Adopting a healthy lifestyle and getting preventive care are important in promoting health and wellness. Ask your health care provider about: The right schedule for you to have regular tests and exams. Things you can do on your own to prevent diseases and keep yourself healthy. What should I know about diet, weight, and exercise? Eat a healthy diet  Eat a diet that includes plenty of vegetables, fruits, low-fat dairy products, and lean protein. Do not eat a lot of foods that are high in solid fats, added sugars, or sodium. Maintain a healthy weight Body mass index (BMI) is a measurement that can be used to identify possible weight problems. It estimates body fat based on height and weight. Your health care provider can help determine your BMI and help you achieve or maintain a healthy weight. Get regular exercise Get regular exercise. This is one of the most important things you can do for your health. Most adults should: Exercise for at least 150 minutes each week. The exercise should increase your heart rate and make you sweat (moderate-intensity exercise). Do strengthening exercises at least twice a week. This is in addition to the moderate-intensity exercise. Spend less time sitting. Even light physical activity can be beneficial. Watch cholesterol and blood lipids Have your blood tested for lipids and cholesterol at 77 years of age, then have this test every 5 years. You may need to have your cholesterol levels checked more often if: Your lipid or cholesterol levels are high. You are older than 77 years of age. You are at high risk for heart disease. What should I know about cancer screening? Many types of cancers can be detected early and may often be prevented. Depending on your health history and family history, you may need to have cancer screening at various ages. This may include screening for: Colorectal cancer. Prostate cancer. Skin cancer. Lung  cancer. What should I know about heart disease, diabetes, and high blood pressure? Blood pressure and heart disease High blood pressure causes heart disease and increases the risk of stroke. This is more likely to develop in people who have high blood pressure readings or are overweight. Talk with your health care provider about your target blood pressure readings. Have your blood pressure checked: Every 3-5 years if you are 18-39 years of age. Every year if you are 40 years old or older. If you are between the ages of 65 and 75 and are a current or former smoker, ask your health care provider if you should have a one-time screening for abdominal aortic aneurysm (AAA). Diabetes Have regular diabetes screenings. This checks your fasting blood sugar level. Have the screening done: Once every three years after age 45 if you are at a normal weight and have a low risk for diabetes. More often and at a younger age if you are overweight or have a high risk for diabetes. What should I know about preventing infection? Hepatitis B If you have a higher risk for hepatitis B, you should be screened for this virus. Talk with your health care provider to find out if you are at risk for hepatitis B infection. Hepatitis C Blood testing is recommended for: Everyone born from 1945 through 1965. Anyone with known risk factors for hepatitis C. Sexually transmitted infections (STIs) You should be screened each year for STIs, including gonorrhea and chlamydia, if: You are sexually active and are younger than 77 years of age. You are older than 77 years of age and your   health care provider tells you that you are at risk for this type of infection. Your sexual activity has changed since you were last screened, and you are at increased risk for chlamydia or gonorrhea. Ask your health care provider if you are at risk. Ask your health care provider about whether you are at high risk for HIV. Your health care provider  may recommend a prescription medicine to help prevent HIV infection. If you choose to take medicine to prevent HIV, you should first get tested for HIV. You should then be tested every 3 months for as long as you are taking the medicine. Follow these instructions at home: Alcohol use Do not drink alcohol if your health care provider tells you not to drink. If you drink alcohol: Limit how much you have to 0-2 drinks a day. Know how much alcohol is in your drink. In the [place] one drink equals one 12 oz bottle of beer (355 mL), one 5 oz glass of Oluwanifemi Susman (148 mL), or one 1 oz glass of hard liquor (44 mL). Lifestyle Do not use any products that contain nicotine or tobacco. These products include cigarettes, chewing tobacco, and vaping devices, such as e-cigarettes. If you need help quitting, ask your health care provider. Do not use street drugs. Do not share needles. Ask your health care provider for help if you need support or information about quitting drugs. General instructions Schedule regular health, dental, and eye exams. Stay current with your vaccines. Tell your health care provider if: You often feel depressed. You have ever been abused or do not feel safe at home. Summary Adopting a healthy lifestyle and getting preventive care are important in promoting health and wellness. Follow your health care provider's instructions about healthy diet, exercising, and getting tested or screened for diseases. Follow your health care provider's instructions on monitoring your cholesterol and blood pressure. This information is not intended to replace advice given to you by your health care provider. Make sure you discuss any questions you have with your health care provider. Document Revised: 03/18/2021 Document Reviewed: 03/18/2021 Elsevier Patient Education  2023 Elsevier Inc.  

## 2022-08-21 ENCOUNTER — Ambulatory Visit (INDEPENDENT_AMBULATORY_CARE_PROVIDER_SITE_OTHER): Payer: Medicare HMO | Admitting: *Deleted

## 2022-08-21 DIAGNOSIS — Z0000 Encounter for general adult medical examination without abnormal findings: Secondary | ICD-10-CM

## 2022-08-21 DIAGNOSIS — Z1211 Encounter for screening for malignant neoplasm of colon: Secondary | ICD-10-CM
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
## 2022-08-25 ENCOUNTER — Encounter: Payer: Self-pay | Admitting: Internal Medicine

## 2022-08-25 ENCOUNTER — Ambulatory Visit: Payer: Medicare HMO | Attending: Internal Medicine | Admitting: Internal Medicine

## 2022-08-25 DIAGNOSIS — I4510 Unspecified right bundle-branch block: Secondary | ICD-10-CM | POA: Diagnosis not present

## 2022-08-25 DIAGNOSIS — I700 Atherosclerosis of aorta: Secondary | ICD-10-CM | POA: Diagnosis not present

## 2022-08-25 DIAGNOSIS — I10 Essential (primary) hypertension: Secondary | ICD-10-CM | POA: Diagnosis not present

## 2022-08-25 DIAGNOSIS — I77810 Thoracic aortic ectasia: Secondary | ICD-10-CM | POA: Diagnosis not present

## 2022-08-25 DIAGNOSIS — G4733 Obstructive sleep apnea (adult) (pediatric): Secondary | ICD-10-CM | POA: Diagnosis not present

## 2022-08-25 DIAGNOSIS — E782 Mixed hyperlipidemia: Secondary | ICD-10-CM | POA: Diagnosis not present

## 2022-08-25 NOTE — Patient Instructions (Signed)
Medication Instructions:  Your physician has recommended you make the following change in your medication:  STOP: metoprolol tartrate  *If you need a refill on your cardiac medications before your next appointment, please call your pharmacy*   Lab Work: Prior to June 2024 CT Aorta: BMP  If you have labs (blood work) drawn today and your tests are completely normal, you will receive your results only by: [place] (if you have MyChart) OR A paper copy in the mail If you have any lab test that is abnormal or we need to change your treatment, we will call you to review the results.   Testing/Procedures: Your physician has requested that you have a CT Aorta in June 2024.    Follow-Up: At [facility], you and your health needs are our priority.  As part of our continuing mission to provide you with exceptional heart care, we have created designated Provider Care Teams.  These Care Teams include your primary Cardiologist (physician) and Advanced Practice Providers (APPs -  Physician Assistants and Nurse Practitioners) who all work together to provide you with the care you need, when you need it.    Your next appointment:   9 month(s)  The format for your next appointment:   In Person  Provider:   Werner Lean, MD      Important Information About Sugar

## 2022-09-15 ENCOUNTER — Encounter: Payer: Self-pay | Admitting: Physician Assistant

## 2022-10-21 ENCOUNTER — Ambulatory Visit: Payer: Medicare HMO | Admitting: Physician Assistant

## 2022-10-21 ENCOUNTER — Encounter: Payer: Self-pay | Admitting: Physician Assistant

## 2022-10-21 VITALS — BP 116/68 | HR 56 | Ht 66.0 in | Wt 256.0 lb

## 2022-10-21 DIAGNOSIS — K449 Diaphragmatic hernia without obstruction or gangrene: Secondary | ICD-10-CM | POA: Diagnosis not present

## 2022-10-21 DIAGNOSIS — Z800 Family history of malignant neoplasm of digestive organs: Secondary | ICD-10-CM | POA: Diagnosis not present

## 2022-10-21 DIAGNOSIS — K219 Gastro-esophageal reflux disease without esophagitis: Secondary | ICD-10-CM | POA: Diagnosis not present

## 2022-10-21 DIAGNOSIS — Z86010 Personal history of colonic polyps: Secondary | ICD-10-CM
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
## 2022-10-21 NOTE — Progress Notes (Signed)
Agree with the assessment and plan as outlined by Jennifer Lemmon, PA-C. ? ?Calin Fantroy E. Jadalee Westcott, MD ? ?

## 2022-10-21 NOTE — Patient Instructions (Signed)
You have been scheduled for an endoscopy and colonoscopy. Please follow the written instructions given to you at your visit today. Please pick up your prep supplies at the pharmacy within the next 1-3 days. If you use inhalers (even only as needed), please bring them with you on the day of your procedure.  _______________________________________________________  If you are age 77 or older, your body mass index should be between 23-30. Your Body mass index is 41.32 kg/m. If this is out of the aforementioned range listed, please consider follow up with your Primary Care Provider.  If you are age 31 or younger, your body mass index should be between 19-25. Your Body mass index is 41.32 kg/m. If this is out of the aformentioned range listed, please consider follow up with your Primary Care Provider.   ________________________________________________________  The [facility] providers would like to encourage you to use [facility] to communicate with providers for non-urgent requests or questions.  Due to long hold times on the telephone, sending your provider a message by [facility] may be a faster and more efficient way to get a response.  Please allow 48 business hours for a response.  Please remember that this is for non-urgent requests.  _______________________________________________________

## 2022-11-18 ENCOUNTER — Encounter: Payer: Self-pay | Admitting: Gastroenterology

## 2022-11-18 ENCOUNTER — Ambulatory Visit (AMBULATORY_SURGERY_CENTER): Payer: Medicare HMO | Admitting: Gastroenterology

## 2022-11-18 VITALS — BP 148/96 | HR 74 | Temp 97.1°F | Resp 16 | Ht 66.0 in | Wt 256.0 lb

## 2022-11-18 DIAGNOSIS — Z800 Family history of malignant neoplasm of digestive organs: Secondary | ICD-10-CM | POA: Diagnosis not present

## 2022-11-18 DIAGNOSIS — Z860100 Personal history of colon polyps, unspecified: Secondary | ICD-10-CM

## 2022-11-18 DIAGNOSIS — K219 Gastro-esophageal reflux disease without esophagitis: Secondary | ICD-10-CM

## 2022-11-18 DIAGNOSIS — K317 Polyp of stomach and duodenum: Secondary | ICD-10-CM | POA: Diagnosis not present

## 2022-11-18 DIAGNOSIS — K2100 Gastro-esophageal reflux disease with esophagitis, without bleeding: Secondary | ICD-10-CM | POA: Diagnosis not present

## 2022-11-18 DIAGNOSIS — Z09 Encounter for follow-up examination after completed treatment for conditions other than malignant neoplasm: Secondary | ICD-10-CM

## 2022-11-18 DIAGNOSIS — D122 Benign neoplasm of ascending colon: Secondary | ICD-10-CM | POA: Diagnosis not present

## 2022-11-18 DIAGNOSIS — D123 Benign neoplasm of transverse colon: Secondary | ICD-10-CM

## 2022-11-18 DIAGNOSIS — K3189 Other diseases of stomach and duodenum: Secondary | ICD-10-CM | POA: Diagnosis not present

## 2022-11-18 DIAGNOSIS — K6389 Other specified diseases of intestine: Secondary | ICD-10-CM

## 2022-11-18 DIAGNOSIS — D124 Benign neoplasm of descending colon: Secondary | ICD-10-CM | POA: Diagnosis not present

## 2022-11-18 MED ORDER — OMEPRAZOLE 20 MG PO CPDR: 20.0000 mg | DELAYED_RELEASE_CAPSULE | Freq: Two times a day (BID) | ORAL | 0 refills | Status: DC

## 2022-11-18 MED ORDER — SODIUM CHLORIDE 0.9 % IV SOLN: 500.0000 mL | Freq: Once | INTRAVENOUS | Status: DC

## 2022-11-18 NOTE — Progress Notes (Signed)
History and Physical Interval Note:  11/18/2022 2:26 PM  Justin Padilla  has presented today for endoscopic procedure(s), with the diagnosis of  Encounter Diagnoses  Name Primary?   Gastroesophageal reflux disease without esophagitis Yes   History of colon polyps   .  The various methods of evaluation and treatment have been discussed with the patient and/or family. After consideration of risks, benefits and other options for treatment, the patient has consented to  the endoscopic procedure(s).   The patient's history has been reviewed, patient examined, no change in status, stable for endoscopic procedure(s).  I have reviewed the patient's chart and labs.  Questions were answered to the patient's satisfaction.     [name] E. Candis Schatz, MD [facility]

## 2022-11-18 NOTE — Patient Instructions (Addendum)
Handouts Provided:  Polyps and High Fiber Diet  - Patient has a contact number available for emergencies. The signs and symptoms of potential delayed complications were discussed with the patient. Return to normal activities tomorrow. Written discharge instructions were provided to the patient. - Resume previous diet. - Continue present medications. - Await pathology results. - Use Prilosec (omeprazole) 20 mg PO BID for 8 weeks. - Repeat EGD in 8 weeks to assess healing of esophagitis. - Further recommendations for endoscopic surveillance will be based on pathology results.  - Patient has a contact number available for emergencies. The signs and symptoms of potential delayed complications were discussed with the patient. Return to normal activities tomorrow. Written discharge instructions were provided to the patient. - Resume previous diet. - Continue present medications. - Await pathology results. - Repeat colonoscopy (date not yet determined) for surveillance based on pathology results. - Recommend high fiber diet/fiber supplementation to reduce risk of diverticular complications  YOU HAD AN ENDOSCOPIC PROCEDURE TODAY AT [facility]:   Refer to the procedure report that was given to you for any specific questions about what was found during the examination.  If the procedure report does not answer your questions, please call your gastroenterologist to clarify.  If you requested that your care partner not be given the details of your procedure findings, then the procedure report has been included in a sealed envelope for you to review at your convenience later.  YOU SHOULD EXPECT: Some feelings of bloating in the abdomen. Passage of more gas than usual.  Walking can help get rid of the air that was put into your GI tract during the procedure and reduce the bloating. If you had a lower endoscopy (such as a colonoscopy or flexible sigmoidoscopy) you may notice spotting of  blood in your stool or on the toilet paper. If you underwent a bowel prep for your procedure, you may not have a normal bowel movement for a few days.  Please Note:  You might notice some irritation and congestion in your nose or some drainage.  This is from the oxygen used during your procedure.  There is no need for concern and it should clear up in a day or so.  SYMPTOMS TO REPORT IMMEDIATELY:  Following lower endoscopy (colonoscopy or flexible sigmoidoscopy):  Excessive amounts of blood in the stool  Significant tenderness or worsening of abdominal pains  Swelling of the abdomen that is new, acute  Fever of 100F or higher  Following upper endoscopy (EGD)  Vomiting of blood or coffee ground material  New chest pain or pain under the shoulder blades  Painful or persistently difficult swallowing  New shortness of breath  Fever of 100F or higher  Black, tarry-looking stools  For urgent or emergent issues, a gastroenterologist can be reached at any hour by calling [phone]. Do not use MyChart messaging for urgent concerns.    DIET:  We do recommend a small meal at first, but then you may proceed to your regular diet.  Drink plenty of fluids but you should avoid alcoholic beverages for 24 hours.  ACTIVITY:  You should plan to take it easy for the rest of today and you should NOT DRIVE or use heavy machinery until tomorrow (because of the sedation medicines used during the test).    FOLLOW UP: Our staff will call the number listed on your records the next business day following your procedure.  We will call around 7:15- 8:00 am to check  on you and address any questions or concerns that you may have regarding the information given to you following your procedure. If we do not reach you, we will leave a message.     If any biopsies were taken you will be contacted by phone or by letter within the next 1-3 weeks.  Please call us at [phone] if you have not heard about the  biopsies in 3 weeks.    SIGNATURES/CONFIDENTIALITY: You and/or your care partner have signed paperwork which will be entered into your electronic medical record.  These signatures attest to the fact that that the information above on your After Visit Summary has been reviewed and is understood.  Full responsibility of the confidentiality of this discharge information lies with you and/or your care-partner.

## 2022-11-18 NOTE — Progress Notes (Signed)
Pt's states no medical or surgical changes since previsit or office visit. 

## 2022-11-18 NOTE — Progress Notes (Signed)
Report to pacu rn. Vss. Care resumed by rn. 

## 2022-11-18 NOTE — Op Note (Signed)
[place] Patient Name: Justin Padilla Procedure Date: 11/18/2022 2:25 PM MRN: [id number] Endoscopist: [place] Candis Schatz , MD, [id number] Age: 77 Referring MD:  Date of Birth: Jan 16, 1946 Gender: Male Account #: [id number] Procedure:                Colonoscopy Indications:              Surveillance: Personal history of adenomatous                            polyps on last colonoscopy 3 years ago, High risk                            colon cancer surveillance: Personal history of                            adenoma (10 mm or greater in size) Medicines:                Monitored Anesthesia Care Procedure:                Pre-Anesthesia Assessment:                           - Prior to the procedure, a History and Physical                            was performed, and patient medications and                            allergies were reviewed. The patient's tolerance of                            previous anesthesia was also reviewed. The risks                            and benefits of the procedure and the sedation                            options and risks were discussed with the patient.                            All questions were answered, and informed consent                            was obtained. Prior Anticoagulants: The patient has                            taken no anticoagulant or antiplatelet agents. ASA                            Grade Assessment: III - A patient with severe                            systemic disease. After reviewing the risks and  benefits, the patient was deemed in satisfactory                            condition to undergo the procedure.                           After obtaining informed consent, the colonoscope                            was passed under direct vision. Throughout the                            procedure, the patient's blood pressure, pulse, and                            oxygen saturations were  monitored continuously. The                            CF [id number] #[id number] was introduced through the anus                            and advanced to the the cecum, identified by                            appendiceal orifice and ileocecal valve. The                            colonoscopy was somewhat difficult due to a                            redundant colon and significant looping. Successful                            completion of the procedure was aided by using                            manual pressure. The patient tolerated the                            procedure well. The quality of the bowel                            preparation was adequate. The ileocecal valve,                            appendiceal orifice, and rectum were photographed.                            The bowel preparation used was SUPREP via split                            dose instruction. Scope In: 3:09:53 PM Scope Out: 3:44:37 PM Scope Withdrawal Time: 0 hours 26 minutes 23 seconds  Total Procedure Duration: 0 hours 34 minutes  44 seconds  Findings:                 The perianal and digital rectal examinations were                            normal. Pertinent negatives include normal                            sphincter tone and no palpable rectal lesions.                           A 3 mm polyp was found in the ascending colon. The                            polyp was sessile. The polyp was removed with a                            cold snare. Resection and retrieval were complete.                            Estimated blood loss was minimal.                           A 2 mm polyp was found in the ascending colon. The                            polyp was sessile. The polyp was removed with a                            cold biopsy forceps. Resection and retrieval were                            complete. Estimated blood loss was minimal.                           Two sessile polyps were found in the  transverse                            colon. The polyps were 4 to 12 mm in size. These                            polyps were removed with a cold snare. Resection                            and retrieval were complete. Estimated blood loss                            was minimal.                           Two sessile polyps were found in the splenic  flexure. The polyps were 3 to 4 mm in size. These                            polyps were removed with a cold snare. Resection                            and retrieval were complete. Estimated blood loss                            was minimal.                           A 3 mm polyp was found in the descending colon. The                            polyp was sessile. The polyp was removed with a                            cold snare. Resection and retrieval were complete.                            Estimated blood loss was minimal.                           Many large-mouthed, medium-mouthed and                            small-mouthed diverticula were found in the sigmoid                            colon and descending colon. There was narrowing of                            the colon in association with the diverticular                            opening.                           The exam was otherwise normal throughout the                            examined colon.                           The retroflexed view of the distal rectum and anal                            verge was normal and showed no anal or rectal                            abnormalities. Complications:            No immediate complications. Estimated Blood Loss:     Estimated blood loss was minimal. Impression:               -  One 3 mm polyp in the ascending colon, removed                            with a cold snare. Resected and retrieved.                           - One 2 mm polyp in the ascending colon, removed                            with a  cold biopsy forceps. Resected and retrieved.                           - Two 4 to 12 mm polyps in the transverse colon,                            removed with a cold snare. Resected and retrieved.                           - Two 3 to 4 mm polyps at the splenic flexure,                            removed with a cold snare. Resected and retrieved.                           - One 3 mm polyp in the descending colon, removed                            with a cold snare. Resected and retrieved.                           - Moderate diverticulosis in the sigmoid colon and                            in the descending colon. There was narrowing of the                            colon in association with the diverticular opening.                           - The distal rectum and anal verge are normal on                            retroflexion view. Recommendation:           - Patient has a contact number available for                            emergencies. The signs and symptoms of potential                            delayed complications were discussed with the  patient. Return to normal activities tomorrow.                            Written discharge instructions were provided to the                            patient.                           - Resume previous diet.                           - Continue present medications.                           - Await pathology results.                           - Repeat colonoscopy (date not yet determined) for                            surveillance based on pathology results.                           - Recommend high fiber diet/fiber supplementation                            to reduce risk of diverticular complications Ilya Neely E. Candis Schatz, MD 11/18/2022 4:11:03 PM This report has been signed electronically.

## 2022-11-18 NOTE — Op Note (Signed)
[place] Patient Name: Justin Padilla Procedure Date: 11/18/2022 2:25 PM MRN: [id number] Endoscopist: [place] Candis Schatz , MD, [id number] Age: 77 Referring MD:  Date of Birth: 12/17/1945 Gender: Male Account #: [id number] Procedure:                Upper GI endoscopy Indications:              Follow-up of reflux esophagitis, Family history of                            gastric cancer Medicines:                Monitored Anesthesia Care Procedure:                Pre-Anesthesia Assessment:                           - Prior to the procedure, a History and Physical                            was performed, and patient medications and                            allergies were reviewed. The patient's tolerance of                            previous anesthesia was also reviewed. The risks                            and benefits of the procedure and the sedation                            options and risks were discussed with the patient.                            All questions were answered, and informed consent                            was obtained. Prior Anticoagulants: The patient has                            taken no anticoagulant or antiplatelet agents. ASA                            Grade Assessment: III - A patient with severe                            systemic disease. After reviewing the risks and                            benefits, the patient was deemed in satisfactory                            condition to undergo the procedure.  After obtaining informed consent, the endoscope was                            passed under direct vision. Throughout the                            procedure, the patient's blood pressure, pulse, and                            oxygen saturations were monitored continuously. The                            GIF [id number] was introduced through the                            mouth, and advanced to the  second part of duodenum.                            The upper GI endoscopy was accomplished without                            difficulty. The patient tolerated the procedure                            well. Scope In: Scope Out: Findings:                 The examined portions of the nasopharynx,                            oropharynx and larynx were normal.                           One linear esophageal ulcer with no bleeding and no                            stigmata of recent bleeding was found 34 to 37 cm                            from the incisors. The lesion was 2 mm in largest                            dimension. Biopsies were taken with a cold forceps                            for histology. Estimated blood loss was minimal.                           LA Grade B (one or more mucosal breaks greater than                            5 mm, not extending between the tops of two mucosal  folds) esophagitis with no bleeding was found [cm                            from incisors].                           Five 3 to 6 mm sessile polyps with no bleeding and                            no stigmata of recent bleeding were found in the                            gastric body. The polyp was removed with a cold                            snare. Resection and retrieval were complete.                            Estimated blood loss was minimal.                           A single 10 mm sessile polyp was found in the                            gastric body. The polyp was removed with a hot                            snare. Resection and retrieval were complete.                            Estimated blood loss was minimal.                           Multiple 2 to 5 mm sessile polyps were found in the                            gastric fundus and in the gastric body. Two of                            these polyps were removed with a cold snare.                             Resection and retrieval were complete. Estimated                            blood loss was minimal.                           The exam of the stomach was otherwise normal.                           Biopsies were taken with a cold forceps in the  gastric body and in the gastric antrum for                            Helicobacter pylori testing. Estimated blood loss                            was minimal.                           A single 3 mm sessile polyp was found in the second                            portion of the duodenum. The polyp was removed with                            a cold snare. Resection and retrieval were                            complete. Estimated blood loss was minimal.                           Diffuse mild inflammation characterized by erythema                            and nodularity was found in the duodenal bulb.                            Biopsies were taken with a cold forceps for                            histology. Estimated blood loss was minimal.                           The exam of the duodenum was otherwise normal. Complications:            No immediate complications. Estimated Blood Loss:     Estimated blood loss was minimal. Impression:               - The examined portions of the nasopharynx,                            oropharynx and larynx were normal.                           - Esophageal ulcer with no bleeding and no stigmata                            of recent bleeding. Biopsied. Suspect this is                            reflux related, but the elongated linear shape and                            EGD in 2020 reporting similar finding makes reflux  questionable.                           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                           - Five gastric polyps. Resected and retrieved.                            These were concerning for adenomatous polyps.                            - A single gastric polyp. Resected with hot snare                            and retrieved, concerning for adenoma.                           - Multiple gastric polyps. Two were resected and                            retrieved. These seemed consistent with fundic                            gland polyps.                           - A single duodenal polyp. Resected and retrieved.                           - Duodenitis. Biopsied.                           - Biopsies were taken with a cold forceps for                            Helicobacter pylori testing. Recommendation:           - Patient has a contact number available for                            emergencies. The signs and symptoms of potential                            delayed complications were discussed with the                            patient. Return to normal activities tomorrow.                            Written discharge instructions were provided to the                            patient.                           -  Resume previous diet.                           - Continue present medications.                           - Await pathology results.                           - Use Prilosec (omeprazole) 20 mg PO BID for 8                            weeks.                           - Repeat EGD in 8 weeks to assess healing of                            esophagitis.                           - Further recommendations for endoscopic                            surveillance will be based on pathology results. [place] Lozito E. Candis Schatz, MD 11/18/2022 4:03:45 PM This report has been signed electronically.

## 2022-11-19 ENCOUNTER — Telehealth: Payer: Self-pay

## 2022-11-19 NOTE — Telephone Encounter (Signed)
  Follow up Call-     11/18/2022    2:09 PM  Call back number  Post procedure Call Back phone  # [phone]  Permission to leave phone message Yes     Patient questions:  Do you have a fever, pain , or abdominal swelling? No. Pain Score  0 *  Have you tolerated food without any problems? Yes.    Have you been able to return to your normal activities? Yes.    Do you have any questions about your discharge instructions: Diet   No. Medications  No. Follow up visit  No.  Do you have questions or concerns about your Care? No.  Actions: * If pain score is 4 or above: No action needed, pain <4.

## 2022-11-28 NOTE — Progress Notes (Signed)
Justin Padilla,  The polyps removed from your stomach were a combination of different polyps (fundic gland, hyperplastic, foveolar adenomas).  In general, these types of polyps are considered low risk for transformation into cancer.  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  This is suggestive of a higher risk of cancer transformation. The biopsies of the ulceration in your esophagus showed benign inflammatory changes, most suggestive of reflux esophagitis. 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  It is important to take the acid reducing medication as prescribed to allow the inflammation of the esophagus to heal.  When we repeat your EGD in March, I will remove more stomach polyps.  Then I would recommend we repeat an EGD again a year later to assess for recurrent polyps and screen for stomach cancer.  Recommendations for further surveillance will be based on the findings at that time.   All of the polyps that I removed during your colonoscopy were completely benign but were proven to be "pre-cancerous" polyps that MAY have grown into cancers if they had not been removed.  Studies shows that at least 20% of women over age 25 and 30% of men over age 82 have pre-cancerous polyps.  Based on current nationally recognized surveillance guidelines, I recommend that you have a repeat colonoscopy in 3 years.   Given your advanced age, and small size of the polyps, I am not sure this is absolutely necessary.   I would suggest we reassess the risks/benefits of another colonoscopy again in 3 years.

## 2022-12-23 DIAGNOSIS — R69 Illness, unspecified: Secondary | ICD-10-CM | POA: Diagnosis not present

## 2023-01-09 DIAGNOSIS — R69 Illness, unspecified: Secondary | ICD-10-CM | POA: Diagnosis not present

## 2023-01-13 ENCOUNTER — Encounter: Payer: Self-pay | Admitting: Gastroenterology

## 2023-01-13 ENCOUNTER — Ambulatory Visit (AMBULATORY_SURGERY_CENTER): Payer: Medicare HMO | Admitting: Gastroenterology

## 2023-01-13 VITALS — BP 127/75 | HR 63 | Temp 96.2°F | Resp 21 | Ht 66.0 in | Wt 256.0 lb

## 2023-01-13 DIAGNOSIS — E669 Obesity, unspecified: Secondary | ICD-10-CM | POA: Diagnosis not present

## 2023-01-13 DIAGNOSIS — K219 Gastro-esophageal reflux disease without esophagitis: Secondary | ICD-10-CM | POA: Diagnosis not present

## 2023-01-13 DIAGNOSIS — G4733 Obstructive sleep apnea (adult) (pediatric): Secondary | ICD-10-CM | POA: Diagnosis not present

## 2023-01-13 DIAGNOSIS — K319 Disease of stomach and duodenum, unspecified: Secondary | ICD-10-CM | POA: Diagnosis not present

## 2023-01-13 DIAGNOSIS — K317 Polyp of stomach and duodenum: Secondary | ICD-10-CM | POA: Diagnosis not present

## 2023-01-13 DIAGNOSIS — K760 Fatty (change of) liver, not elsewhere classified: Secondary | ICD-10-CM | POA: Diagnosis not present

## 2023-01-13 MED ORDER — SODIUM CHLORIDE 0.9 % IV SOLN: 500.0000 mL | INTRAVENOUS | Status: DC

## 2023-01-13 NOTE — Progress Notes (Signed)
Called to room to assist during endoscopic procedure.  Patient ID and intended procedure confirmed with present staff. Received instructions for my participation in the procedure from the performing physician.  

## 2023-01-13 NOTE — Op Note (Signed)
[place] Patient Name: Justin Padilla Procedure Date: 01/13/2023 2:06 PM MRN: [id number] Endoscopist: [place] Candis Schatz , MD, [id number] Age: 77 Referring MD:  Date of Birth: 08/03/1946 Gender: Male Account #: [id number] Procedure:                Upper GI endoscopy Indications:              Follow-up of esophageal reflux Medicines:                Monitored Anesthesia Care Procedure:                Pre-Anesthesia Assessment:                           - Prior to the procedure, a History and Physical                            was performed, and patient medications and                            allergies were reviewed. The patient's tolerance of                            previous anesthesia was also reviewed. The risks                            and benefits of the procedure and the sedation                            options and risks were discussed with the patient.                            All questions were answered, and informed consent                            was obtained. Prior Anticoagulants: The patient has                            taken no anticoagulant or antiplatelet agents. ASA                            Grade Assessment: III - A patient with severe                            systemic disease. After reviewing the risks and                            benefits, the patient was deemed in satisfactory                            condition to undergo the procedure.                           After obtaining informed consent, the endoscope was  passed under direct vision. Throughout the                            procedure, the patient's blood pressure, pulse, and                            oxygen saturations were monitored continuously. The                            GIF [id number] was introduced through the                            mouth, and advanced to the third part of duodenum.                            The upper GI  endoscopy was accomplished without                            difficulty. The patient tolerated the procedure                            well. Scope In: Scope Out: Findings:                 The examined portions of the nasopharynx,                            oropharynx and larynx were normal.                           The Z-line was irregular and was found at the                            gastroesophageal junction. The previously noted                            reflux esophagitis has healed                           The exam of the esophagus was otherwise normal.                            There was a faint linear discoloration in the                            distal esophagus corresponding to the site of the                            previously seen linear esophageal ulcer.                           Diffuse mildly erythematous mucosa without bleeding                            was found in the entire examined stomach. Biopsies  were taken with a cold forceps in the antrum and                            body (8 bites for each site) for Helicobacter                            pylori testing and to evaluate for GIM. Estimated                            blood loss was minimal.                           Multiple 1 to 5 mm sessile polyps with no bleeding                            and no stigmata of recent bleeding were found in                            the gastric fundus and in the gastric body. The                            polyp burden appeared much improved in terms of the                            number and size of polyps compared to the previous                            examination. Five of the largest of these polyps                            were removed with a cold snare. Resection and                            retrieval were complete. Estimated blood loss was                            minimal.                           The exam of the stomach  was otherwise normal.                           Diffuse mildly congested mucosa without active                            bleeding and with no stigmata of bleeding was found                            in the entire duodenum. Complications:            No immediate complications. Estimated Blood Loss:     Estimated blood loss was minimal. Impression:               -  The examined portions of the nasopharynx,                            oropharynx and larynx were normal.                           - Z-line irregular, at the gastroesophageal                            junction.                           - Erythematous mucosa in the stomach. Biopsied.                           - Multiple gastric polyps. Five were resected and                            retrieved.                           - Congested duodenal mucosa. Recommendation:           - Patient has a contact number available for                            emergencies. The signs and symptoms of potential                            delayed complications were discussed with the                            patient. Return to normal activities tomorrow.                            Written discharge instructions were provided to the                            patient.                           - Resume previous diet.                           - Continue present medications. Ok to decrease                            omeprazole to once daily dosing                           - Await pathology results. 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 Lucah Petta E. Candis Schatz, MD 01/13/2023 2:44:46 PM This report has been signed electronically.
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
## 2023-01-13 NOTE — Patient Instructions (Addendum)
Handout on polyps given to patient. Await pathology results.  Resume previous diet and continue present medications - ok to decrease omeprazole to once dialy  Final recommendations for endoscopic surveillance will be based off of pathology results from today's exam, but plan for repeat EGD in 1 year based on previous findings of fundic gland polyps with dysplasia on previous EGD.   YOU HAD AN ENDOSCOPIC PROCEDURE TODAY AT [facility]:   Refer to the procedure report that was given to you for any specific questions about what was found during the examination.  If the procedure report does not answer your questions, please call your gastroenterologist to clarify.  If you requested that your care partner not be given the details of your procedure findings, then the procedure report has been included in a sealed envelope for you to review at your convenience later.  YOU SHOULD EXPECT: Some feelings of bloating in the abdomen. Passage of more gas than usual.  Walking can help get rid of the air that was put into your GI tract during the procedure and reduce the bloating. If you had a lower endoscopy (such as a colonoscopy or flexible sigmoidoscopy) you may notice spotting of blood in your stool or on the toilet paper. If you underwent a bowel prep for your procedure, you may not have a normal bowel movement for a few days.  Please Note:  You might notice some irritation and congestion in your nose or some drainage.  This is from the oxygen used during your procedure.  There is no need for concern and it should clear up in a day or so.  SYMPTOMS TO REPORT IMMEDIATELY:   Following upper endoscopy (EGD)  Vomiting of blood or coffee ground material  New chest pain or pain under the shoulder blades  Painful or persistently difficult swallowing  New shortness of breath  Fever of 100F or higher  Black, tarry-looking stools  For urgent or emergent issues, a gastroenterologist can be  reached at any hour by calling [phone]. Do not use MyChart messaging for urgent concerns.    DIET:  We do recommend a small meal at first, but then you may proceed to your regular diet.  Drink plenty of fluids but you should avoid alcoholic beverages for 24 hours.  ACTIVITY:  You should plan to take it easy for the rest of today and you should NOT DRIVE or use heavy machinery until tomorrow (because of the sedation medicines used during the test).    FOLLOW UP: Our staff will call the number listed on your records the next business day following your procedure.  We will call around 7:15- 8:00 am to check on you and address any questions or concerns that you may have regarding the information given to you following your procedure. If we do not reach you, we will leave a message.     If any biopsies were taken you will be contacted by phone or by letter within the next 1-3 weeks.  Please call us at [phone] if you have not heard about the biopsies in 3 weeks.    SIGNATURES/CONFIDENTIALITY: You and/or your care partner have signed paperwork which will be entered into your electronic medical record.  These signatures attest to the fact that that the information above on your After Visit Summary has been reviewed and is understood.  Full responsibility of the confidentiality of this discharge information lies with you and/or your care-partner.

## 2023-01-13 NOTE — Progress Notes (Signed)
Patient coughed during procedure with O2 desaturation that responded to increased fio2, jaw thrust, and oropharynx suctioning. No gastric contents noted. Patient tolerated procedure well. Uneventful otherwise. Report to pacu rn. Patient on room air in pacu. Vss. Care resumed by rn.

## 2023-01-14 ENCOUNTER — Telehealth: Payer: Self-pay

## 2023-01-14 NOTE — Telephone Encounter (Signed)
Left message on follow up call. 

## 2023-01-19 NOTE — Progress Notes (Signed)
Justin Padilla,  The biopsies taken from your stomach were notable for mild reactive gastropathy which is a common finding and often related to use of certain medications (usually NSAIDs), but there was no evidence of Helicobacter pylori infection. This common finding is not felt to necessarily be a cause of any particular symptom and there is no specific treatment or further evaluation recommended.  The polyps resected from your stomach were benign fundic gland polyps.  There was no evidence of infection with Helicobacter pylori or intestinal metaplasia/dysplasia.    Although fundic gland polyps typically do not require follow up, because you previously had dysplasia seen in one of the polyps, I recommend you repeat upper endoscopy in 1 year to reassess for further polyp growth and precancerous changes.

## 2023-01-28 DIAGNOSIS — D696 Thrombocytopenia, unspecified: Secondary | ICD-10-CM | POA: Insufficient documentation

## 2023-01-28 DIAGNOSIS — I5189 Other ill-defined heart diseases: Secondary | ICD-10-CM | POA: Insufficient documentation

## 2023-01-28 NOTE — Patient Instructions (Addendum)
      Blood work was ordered.   The lab is on the first floor.    Medications changes include :   start trelegy inhaler once a day    A referral was ordered for neurology.     Someone will call you to schedule an appointment.    Return in about 6 months (around 08/01/2023) for Physical Exam.

## 2023-01-28 NOTE — Progress Notes (Unsigned)
Subjective:    Patient ID: Justin Padilla, male    DOB: 1946/10/27, 77 y.o.   MRN: [id number]     HPI Justin Padilla is here for follow up of his chronic medical problems.  Neuropathy? worse in hands - left hand - can not pick up pills - Decreased fine movement of left hand.  The left hand is cold > right hand.  Has never been diagnosed officially with neurology.  Started about one year in hands and feet - has gotten worse.  No symptoms in ankles, lower legs.    Going to the gym - walks mile on treadmill, weights.   Has not been very good about watching salt in diet.  Leg swelling got really bad around November of last year, but has improved.  Legs are still swollen and firm.  He could do better with his salt.  He is working on his weight.  He regained some of what he lost.  Medications and allergies reviewed with patient and updated if appropriate.  Current Outpatient Medications on File Prior to Visit  Medication Sig Dispense Refill   Acetaminophen (TYLENOL PO) Take 2 tablets by mouth as needed.     hydrochlorothiazide (HYDRODIURIL) 12.5 MG tablet Take 1 tablet (12.5 mg total) by mouth daily. 90 tablet 2   metoprolol tartrate (LOPRESSOR) 25 MG tablet Take 25 mg by mouth daily.     Multiple Vitamins-Minerals (CENTRUM SILVER PO) Take 1 tablet by mouth daily.     omeprazole (PRILOSEC) 20 MG capsule Take 1 capsule (20 mg total) by mouth in the morning and at bedtime. 120 capsule 0   No current facility-administered medications on file prior to visit.     Review of Systems  Constitutional:  Negative for fever.  HENT:  Negative for congestion and sore throat.   Respiratory:  Positive for cough (6 weeks - occ phlegm) and shortness of breath (chronic, same). Negative for choking and wheezing.   Cardiovascular:  Positive for leg swelling. Negative for chest pain and palpitations.  Gastrointestinal:        Jerrye Bushy controlled  Musculoskeletal:  Positive for back pain (when he first gets  out of bed - lasts less than one minute - no pain any other time). Negative for neck pain.  Neurological:  Positive for numbness (hands and feet). Negative for dizziness, light-headedness and headaches.       Objective:   Vitals:   01/29/23 0754  BP: 110/78  Pulse: 66  Temp: 98.5 F (36.9 C)  SpO2: 92%   BP Readings from Last 3 Encounters:  01/29/23 110/78  01/13/23 127/75  11/18/22 (!) 148/96   Wt Readings from Last 3 Encounters:  01/29/23 272 lb (123.4 kg)  01/13/23 256 lb (116.1 kg)  11/18/22 256 lb (116.1 kg)   Body mass index is 43.9 kg/m.    Physical Exam Constitutional:      General: He is not in acute distress.    Appearance: Normal appearance. He is not ill-appearing.  HENT:     Head: Normocephalic and atraumatic.  Eyes:     Conjunctiva/sclera: Conjunctivae normal.  Cardiovascular:     Rate and Rhythm: Normal rate and regular rhythm.     Heart sounds: Normal heart sounds.  Pulmonary:     Effort: Pulmonary effort is normal. No respiratory distress.     Breath sounds: Normal breath sounds. No wheezing or rales.  Musculoskeletal:     Right lower leg: Edema (1+ pitting-calf tight) present.  Left lower leg: Edema (1+ pitting-calf tight) present.  Skin:    General: Skin is warm and dry.     Findings: No rash.     Comments: L > R hand cool to touch  Neurological:     Mental Status: He is alert. Mental status is at baseline.     Sensory: No sensory deficit.  Psychiatric:        Mood and Affect: Mood normal.        Lab Results  Component Value Date   WBC 6.8 07/31/2022   HGB 16.9 07/31/2022   HCT 50.7 07/31/2022   PLT 114.0 (L) 07/31/2022   GLUCOSE 93 07/31/2022   CHOL 121 07/31/2022   TRIG 79.0 07/31/2022   HDL 39.60 07/31/2022   LDLCALC 65 07/31/2022   ALT 23 07/31/2022   AST 22 07/31/2022   NA 137 07/31/2022   K 5.0 07/31/2022   CL 104 07/31/2022   CREATININE 1.08 07/31/2022   BUN 23 07/31/2022   CO2 27 07/31/2022   TSH 3.30  01/22/2022   PSA 3.26 11/07/2019   HGBA1C 5.9 07/31/2022     Assessment & Plan:    See Problem List for Assessment and Plan of chronic medical problems.

## 2023-01-29 ENCOUNTER — Ambulatory Visit (INDEPENDENT_AMBULATORY_CARE_PROVIDER_SITE_OTHER): Payer: Medicare HMO | Admitting: Internal Medicine

## 2023-01-29 ENCOUNTER — Encounter: Payer: Self-pay | Admitting: Internal Medicine

## 2023-01-29 VITALS — BP 110/78 | HR 66 | Temp 98.5°F | Ht 66.0 in | Wt 272.0 lb

## 2023-01-29 DIAGNOSIS — Z96651 Presence of right artificial knee joint: Secondary | ICD-10-CM | POA: Insufficient documentation

## 2023-01-29 DIAGNOSIS — D696 Thrombocytopenia, unspecified: Secondary | ICD-10-CM | POA: Diagnosis not present

## 2023-01-29 DIAGNOSIS — G629 Polyneuropathy, unspecified: Secondary | ICD-10-CM | POA: Diagnosis not present

## 2023-01-29 DIAGNOSIS — I5032 Chronic diastolic (congestive) heart failure: Secondary | ICD-10-CM | POA: Diagnosis not present

## 2023-01-29 DIAGNOSIS — R601 Generalized edema: Secondary | ICD-10-CM

## 2023-01-29 DIAGNOSIS — I10 Essential (primary) hypertension: Secondary | ICD-10-CM | POA: Diagnosis not present

## 2023-01-29 DIAGNOSIS — R7303 Prediabetes: Secondary | ICD-10-CM | POA: Diagnosis not present

## 2023-01-29 DIAGNOSIS — I5189 Other ill-defined heart diseases: Secondary | ICD-10-CM

## 2023-01-29 DIAGNOSIS — K219 Gastro-esophageal reflux disease without esophagitis: Secondary | ICD-10-CM

## 2023-01-29 LAB — CBC WITH DIFFERENTIAL/PLATELET
Basophils Absolute: 0 10*3/uL (ref 0.0–0.1)
Basophils Relative: 0.5 % (ref 0.0–3.0)
Eosinophils Absolute: 0.2 10*3/uL (ref 0.0–0.7)
Eosinophils Relative: 1.9 % (ref 0.0–5.0)
HCT: 52.9 % — ABNORMAL HIGH (ref 39.0–52.0)
Hemoglobin: 17.7 g/dL — ABNORMAL HIGH (ref 13.0–17.0)
Lymphocytes Relative: 20.7 % (ref 12.0–46.0)
Lymphs Abs: 1.6 10*3/uL (ref 0.7–4.0)
MCHC: 33.4 g/dL (ref 30.0–36.0)
MCV: 91.2 fl (ref 78.0–100.0)
Monocytes Absolute: 0.7 10*3/uL (ref 0.1–1.0)
Monocytes Relative: 8.7 % (ref 3.0–12.0)
Neutro Abs: 5.4 10*3/uL (ref 1.4–7.7)
Neutrophils Relative %: 68.2 % (ref 43.0–77.0)
Platelets: 123 10*3/uL — ABNORMAL LOW (ref 150.0–400.0)
RBC: 5.8 Mil/uL (ref 4.22–5.81)
RDW: 14.2 % (ref 11.5–15.5)
WBC: 7.9 10*3/uL (ref 4.0–10.5)

## 2023-01-29 LAB — VITAMIN B12: Vitamin B-12: 370 pg/mL (ref 211–911)

## 2023-01-29 LAB — COMPREHENSIVE METABOLIC PANEL
ALT: 21 U/L (ref 0–53)
AST: 22 U/L (ref 0–37)
Albumin: 4.2 g/dL (ref 3.5–5.2)
Alkaline Phosphatase: 71 U/L (ref 39–117)
BUN: 21 mg/dL (ref 6–23)
CO2: 30 mEq/L (ref 19–32)
Calcium: 9 mg/dL (ref 8.4–10.5)
Chloride: 105 mEq/L (ref 96–112)
Creatinine, Ser: 1.12 mg/dL (ref 0.40–1.50)
GFR: 63.19 mL/min (ref >60.00)
Glucose, Bld: 113 mg/dL — ABNORMAL HIGH (ref 70–99)
Potassium: 5.2 mEq/L — ABNORMAL HIGH (ref 3.5–5.1)
Sodium: 141 mEq/L (ref 135–145)
Total Bilirubin: 0.6 mg/dL (ref 0.2–1.2)
Total Protein: 7 g/dL (ref 6.0–8.3)

## 2023-01-29 LAB — HEMOGLOBIN A1C: Hgb A1c MFr Bld: 6 % (ref 4.6–6.5)

## 2023-01-29 MED ORDER — OMEPRAZOLE 20 MG PO CPDR: 20.0000 mg | DELAYED_RELEASE_CAPSULE | Freq: Every day | ORAL | 0 refills | Status: DC

## 2023-01-29 MED ORDER — TRELEGY ELLIPTA 100-62.5-25 MCG/ACT IN AEPB: 1.0000 | INHALATION_SPRAY | Freq: Every day | RESPIRATORY_TRACT | 11 refills | Status: DC

## 2023-01-29 NOTE — Assessment & Plan Note (Signed)
Chronic Mild cbc 

## 2023-01-29 NOTE — Assessment & Plan Note (Signed)
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 Referral to neurology

## 2023-01-29 NOTE — Assessment & Plan Note (Signed)
Chronic Has lost a lot of weight with diet/exercise Continue weight loss efforts Stressed regular exercise Healthy diet, decreased portions

## 2023-01-29 NOTE — Assessment & Plan Note (Signed)
Chronic Check a1c Low sugar / carb diet Stressed regular exercise  

## 2023-01-29 NOTE — Assessment & Plan Note (Addendum)
Chronic GERD controlled Continue omeprazole 20 mg daily  

## 2023-01-29 NOTE — Assessment & Plan Note (Signed)
Chronic Edema 1+ pitting bilateral Continue hydrochlorothiazide 12.5 mg daily Stressed to elevating legs, low-sodium diet, regular exercise and weight loss Can consider Lasix in the future if needed

## 2023-01-29 NOTE — Assessment & Plan Note (Signed)
Chronic Blood pressure well controlled CMP Continue HCTZ 12.5 mg daily, metoprolol 12.5 mg twice daily 

## 2023-02-05 ENCOUNTER — Encounter: Payer: Self-pay | Admitting: Neurology

## 2023-02-09 DIAGNOSIS — R69 Illness, unspecified: Secondary | ICD-10-CM | POA: Diagnosis not present

## 2023-02-24 ENCOUNTER — Encounter (INDEPENDENT_AMBULATORY_CARE_PROVIDER_SITE_OTHER): Payer: Medicare HMO | Admitting: Internal Medicine

## 2023-02-24 ENCOUNTER — Ambulatory Visit: Payer: Medicare HMO | Admitting: Neurology

## 2023-02-24 ENCOUNTER — Encounter: Payer: Self-pay | Admitting: Neurology

## 2023-02-24 ENCOUNTER — Other Ambulatory Visit: Payer: Self-pay | Admitting: Gastroenterology

## 2023-02-24 VITALS — BP 135/67 | HR 67 | Ht 66.0 in | Wt 276.0 lb

## 2023-02-24 DIAGNOSIS — R600 Localized edema: Secondary | ICD-10-CM | POA: Diagnosis not present

## 2023-02-24 DIAGNOSIS — R202 Paresthesia of skin: Secondary | ICD-10-CM

## 2023-02-24 DIAGNOSIS — G5622 Lesion of ulnar nerve, left upper limb: Secondary | ICD-10-CM | POA: Diagnosis not present

## 2023-02-24 DIAGNOSIS — G5712 Meralgia paresthetica, left lower limb: Secondary | ICD-10-CM

## 2023-02-24 NOTE — Patient Instructions (Signed)
Nerve testing of the right arm and leg  Please follow-up with your primary care doctor for swelling in the hand and legs  ELECTROMYOGRAM AND NERVE CONDUCTION STUDIES (EMG/NCS) INSTRUCTIONS  How to Prepare The neurologist conducting the EMG will need to know if you have certain medical conditions. Tell the neurologist and other EMG lab personnel if you: Have a pacemaker or any other electrical medical device Take blood-thinning medications Have hemophilia, a blood-clotting disorder that causes prolonged bleeding Bathing Take a shower or bath shortly before your exam in order to remove oils from your skin. Don't apply lotions or creams before the exam.  What to Expect You'll likely be asked to change into a hospital gown for the procedure and lie down on an examination table. The following explanations can help you understand what will happen during the exam.  Electrodes. The neurologist or a technician places surface electrodes at various locations on your skin depending on where you're experiencing symptoms. Or the neurologist may insert needle electrodes at different sites depending on your symptoms.  Sensations. The electrodes will at times transmit a tiny electrical current that you may feel as a twinge or spasm. The needle electrode may cause discomfort or pain that usually ends shortly after the needle is removed. If you are concerned about discomfort or pain, you may want to talk to the neurologist about taking a short break during the exam.  Instructions. During the needle EMG, the neurologist will assess whether there is any spontaneous electrical activity when the muscle is at rest - activity that isn't present in healthy muscle tissue - and the degree of activity when you slightly contract the muscle.  He or she will give you instructions on resting and contracting a muscle at appropriate times. Depending on what muscles and nerves the neurologist is examining, he or she may ask you to  change positions during the exam.  After your EMG You may experience some temporary, minor bruising where the needle electrode was inserted into your muscle. This bruising should fade within several days. If it persists, contact your primary care doctor.
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
## 2023-02-25 MED ORDER — FUROSEMIDE 20 MG PO TABS: 20.0000 mg | ORAL_TABLET | Freq: Every day | ORAL | 1 refills | Status: DC

## 2023-02-25 NOTE — Telephone Encounter (Signed)

## 2023-03-11 DIAGNOSIS — R69 Illness, unspecified: Secondary | ICD-10-CM | POA: Diagnosis not present

## 2023-04-11 DIAGNOSIS — R69 Illness, unspecified: Secondary | ICD-10-CM | POA: Diagnosis not present

## 2023-04-30 ENCOUNTER — Ambulatory Visit
Admission: RE | Admit: 2023-04-30 | Discharge: 2023-04-30 | Disposition: A | Discharge status: Home / Self Care | Payer: Medicare HMO | Source: Ambulatory Visit | Attending: Internal Medicine | Admitting: Internal Medicine

## 2023-04-30 DIAGNOSIS — I77810 Thoracic aortic ectasia: Secondary | ICD-10-CM | POA: Diagnosis not present

## 2023-04-30 MED ORDER — IOPAMIDOL (ISOVUE-370) INJECTION 76%
75.0000 mL | Freq: Once | INTRAVENOUS | Status: AC | PRN
  Administered 2023-04-30: 75 mL via INTRAVENOUS

## 2023-05-01 ENCOUNTER — Encounter: Payer: Self-pay | Admitting: Internal Medicine

## 2023-05-01 ENCOUNTER — Ambulatory Visit (INDEPENDENT_AMBULATORY_CARE_PROVIDER_SITE_OTHER): Payer: Medicare HMO | Admitting: Internal Medicine

## 2023-05-01 VITALS — BP 128/82 | HR 68 | Temp 97.9°F | Ht 66.0 in | Wt 276.0 lb

## 2023-05-01 DIAGNOSIS — I10 Essential (primary) hypertension: Secondary | ICD-10-CM

## 2023-05-01 DIAGNOSIS — R600 Localized edema: Secondary | ICD-10-CM | POA: Insufficient documentation

## 2023-05-01 MED ORDER — FUROSEMIDE 40 MG PO TABS: 40.0000 mg | ORAL_TABLET | Freq: Every day | ORAL | 1 refills | Status: DC

## 2023-05-01 NOTE — Assessment & Plan Note (Signed)
Chronic Likely related to venous insufficiency, possibly diastolic dysfunction to some degree, but no evidence of active heart failure at this time Leg swelling improved with Lasix 20 mg daily, but still having a good amount of swelling and he would like to do something about it Stressed low-sodium diet, weight loss, regular exercise when he is able, wearing compression socks and elevating legs when sitting Will increase furosemide to 40 mg daily BMP next week

## 2023-05-01 NOTE — Patient Instructions (Addendum)
      Blood work was ordered.   The lab is on the first floor.  Have this done next week.      Medications changes include :   increase lasix to 40 mg daily        Return for follow up as scheduled.

## 2023-05-01 NOTE — Progress Notes (Signed)
    Subjective:    Patient ID: Justin Padilla, male    DOB: [birth year], 77 y.o.   MRN: [id number]      HPI Dub is here for No chief complaint on file.    Leg swelling - swelling can reaches his knees. Improves a little over night.  No worsening of SOB.  He does watch his salt but could do better.   Elevates legs as much as he can.    Taking lasix 20 mg daily.    Medications and allergies reviewed with patient and updated if appropriate.  Current Outpatient Medications on File Prior to Visit  Medication Sig Dispense Refill   Acetaminophen (TYLENOL PO) Take 2 tablets by mouth as needed.     albuterol (VENTOLIN HFA) 108 (90 Base) MCG/ACT inhaler Inhale into the lungs every 6 (six) hours as needed for wheezing or shortness of breath.     Fluticasone-Umeclidin-Vilant (TRELEGY ELLIPTA) 100-62.5-25 MCG/ACT AEPB Inhale 1 puff into the lungs daily. 1 each 11   metoprolol tartrate (LOPRESSOR) 25 MG tablet Take 12.5 mg by mouth daily.     Multiple Vitamins-Minerals (CENTRUM SILVER PO) Take 1 tablet by mouth daily.     omeprazole (PRILOSEC) 20 MG capsule TAKE 1 CAPSULE BY MOUTH IN THE MORNING AND AT BEDTIME 120 capsule 0   No current facility-administered medications on file prior to visit.    Review of Systems  Respiratory:  Positive for shortness of breath (chronic -stable).   Cardiovascular:  Positive for leg swelling. Negative for chest pain and palpitations.  Neurological:  Negative for light-headedness and headaches.       Objective:   Vitals:   05/01/23 1552  BP: 128/82  Pulse: 68  Temp: 97.9 F (36.6 C)  SpO2: 93%   BP Readings from Last 3 Encounters:  05/01/23 128/82  02/24/23 135/67  01/29/23 110/78   Wt Readings from Last 3 Encounters:  05/01/23 276 lb (125.2 kg)  02/24/23 276 lb (125.2 kg)  01/29/23 272 lb (123.4 kg)   Body mass index is 44.55 kg/m.    Physical Exam Constitutional:      General: He is not in acute distress.    Appearance: Normal  appearance. He is not ill-appearing.  HENT:     Head: Normocephalic and atraumatic.  Eyes:     Conjunctiva/sclera: Conjunctivae normal.  Cardiovascular:     Rate and Rhythm: Normal rate and regular rhythm.     Heart sounds: Normal heart sounds.  Pulmonary:     Effort: Pulmonary effort is normal. No respiratory distress.     Breath sounds: Normal breath sounds. No wheezing or rales.  Musculoskeletal:     Right lower leg: Edema (1+ pitting) present.     Left lower leg: Edema (1+ pitting) present.  Skin:    General: Skin is warm and dry.     Findings: No rash.  Neurological:     Mental Status: He is alert. Mental status is at baseline.  Psychiatric:        Mood and Affect: Mood normal.            Assessment & Plan:    See Problem List for Assessment and Plan of chronic medical problems.

## 2023-05-01 NOTE — Assessment & Plan Note (Signed)
Chronic Blood pressure well controlled Continue  metoprolol 12.5 mg twice daily Will be increasing Lasix-discussed this can lower his blood pressure a little and to let me know if he feels lightheaded

## 2023-05-04 ENCOUNTER — Telehealth: Payer: Self-pay

## 2023-05-04 DIAGNOSIS — I700 Atherosclerosis of aorta: Secondary | ICD-10-CM

## 2023-05-04 DIAGNOSIS — I77810 Thoracic aortic ectasia: Secondary | ICD-10-CM

## 2023-05-04 NOTE — Telephone Encounter (Signed)
Patient reviewed results in MyChart. Orders for echo have been placed.

## 2023-05-04 NOTE — Telephone Encounter (Signed)
-----   Message from Christell Constant, MD sent at 04/30/2023  5:05 PM EDT ----- Results: Stable aortic size Plan: Echo in one year  Christell Constant, MD

## 2023-05-11 DIAGNOSIS — R69 Illness, unspecified: Secondary | ICD-10-CM | POA: Diagnosis not present

## 2023-05-20 DIAGNOSIS — H5203 Hypermetropia, bilateral: Secondary | ICD-10-CM | POA: Diagnosis not present

## 2023-05-20 DIAGNOSIS — H25013 Cortical age-related cataract, bilateral: Secondary | ICD-10-CM | POA: Diagnosis not present

## 2023-05-20 DIAGNOSIS — H1789 Other corneal scars and opacities: Secondary | ICD-10-CM | POA: Diagnosis not present

## 2023-05-20 DIAGNOSIS — H2513 Age-related nuclear cataract, bilateral: Secondary | ICD-10-CM | POA: Diagnosis not present

## 2023-05-22 DIAGNOSIS — Z0100 Encounter for examination of eyes and vision without abnormal findings: Secondary | ICD-10-CM | POA: Diagnosis not present

## 2023-06-02 ENCOUNTER — Other Ambulatory Visit: Payer: Self-pay

## 2023-06-02 ENCOUNTER — Emergency Department (HOSPITAL_COMMUNITY): Payer: Medicare HMO

## 2023-06-02 ENCOUNTER — Encounter (HOSPITAL_COMMUNITY): Payer: Self-pay

## 2023-06-02 ENCOUNTER — Inpatient Hospital Stay (HOSPITAL_COMMUNITY)
Admission: EM | Admit: 2023-06-02 | Discharge: 2023-06-08 | DRG: 683 | Disposition: A | Discharge status: Home / Self Care | Payer: Medicare HMO | Attending: Internal Medicine | Admitting: Internal Medicine

## 2023-06-02 DIAGNOSIS — Z96651 Presence of right artificial knee joint: Secondary | ICD-10-CM | POA: Diagnosis present

## 2023-06-02 DIAGNOSIS — W19XXXA Unspecified fall, initial encounter: Secondary | ICD-10-CM | POA: Diagnosis present

## 2023-06-02 DIAGNOSIS — I5032 Chronic diastolic (congestive) heart failure: Secondary | ICD-10-CM | POA: Diagnosis present

## 2023-06-02 DIAGNOSIS — R0602 Shortness of breath: Secondary | ICD-10-CM | POA: Diagnosis not present

## 2023-06-02 DIAGNOSIS — Z91199 Patient's noncompliance with other medical treatment and regimen due to unspecified reason: Secondary | ICD-10-CM

## 2023-06-02 DIAGNOSIS — Z800 Family history of malignant neoplasm of digestive organs: Secondary | ICD-10-CM

## 2023-06-02 DIAGNOSIS — R000 Tachycardia, unspecified: Secondary | ICD-10-CM | POA: Diagnosis not present

## 2023-06-02 DIAGNOSIS — M6282 Rhabdomyolysis: Secondary | ICD-10-CM | POA: Diagnosis not present

## 2023-06-02 DIAGNOSIS — G4733 Obstructive sleep apnea (adult) (pediatric): Secondary | ICD-10-CM | POA: Diagnosis present

## 2023-06-02 DIAGNOSIS — N179 Acute kidney failure, unspecified: Principal | ICD-10-CM | POA: Diagnosis present

## 2023-06-02 DIAGNOSIS — I110 Hypertensive heart disease with heart failure: Secondary | ICD-10-CM | POA: Diagnosis present

## 2023-06-02 DIAGNOSIS — M722 Plantar fascial fibromatosis: Secondary | ICD-10-CM | POA: Diagnosis present

## 2023-06-02 DIAGNOSIS — Z832 Family history of diseases of the blood and blood-forming organs and certain disorders involving the immune mechanism: Secondary | ICD-10-CM

## 2023-06-02 DIAGNOSIS — R197 Diarrhea, unspecified: Secondary | ICD-10-CM | POA: Diagnosis not present

## 2023-06-02 DIAGNOSIS — I213 ST elevation (STEMI) myocardial infarction of unspecified site: Secondary | ICD-10-CM | POA: Diagnosis not present

## 2023-06-02 DIAGNOSIS — I77810 Thoracic aortic ectasia: Secondary | ICD-10-CM | POA: Diagnosis present

## 2023-06-02 DIAGNOSIS — E876 Hypokalemia: Secondary | ICD-10-CM | POA: Diagnosis present

## 2023-06-02 DIAGNOSIS — Z1152 Encounter for screening for COVID-19: Secondary | ICD-10-CM

## 2023-06-02 DIAGNOSIS — Z885 Allergy status to narcotic agent status: Secondary | ICD-10-CM

## 2023-06-02 DIAGNOSIS — A09 Infectious gastroenteritis and colitis, unspecified: Secondary | ICD-10-CM | POA: Diagnosis present

## 2023-06-02 DIAGNOSIS — Z743 Need for continuous supervision: Secondary | ICD-10-CM | POA: Diagnosis not present

## 2023-06-02 DIAGNOSIS — D696 Thrombocytopenia, unspecified: Secondary | ICD-10-CM | POA: Diagnosis present

## 2023-06-02 DIAGNOSIS — B9689 Other specified bacterial agents as the cause of diseases classified elsewhere: Secondary | ICD-10-CM | POA: Diagnosis present

## 2023-06-02 DIAGNOSIS — R0689 Other abnormalities of breathing: Secondary | ICD-10-CM | POA: Diagnosis not present

## 2023-06-02 DIAGNOSIS — K760 Fatty (change of) liver, not elsewhere classified: Secondary | ICD-10-CM | POA: Diagnosis present

## 2023-06-02 DIAGNOSIS — I10 Essential (primary) hypertension: Secondary | ICD-10-CM | POA: Diagnosis present

## 2023-06-02 DIAGNOSIS — T796XXA Traumatic ischemia of muscle, initial encounter: Secondary | ICD-10-CM | POA: Diagnosis present

## 2023-06-02 DIAGNOSIS — R410 Disorientation, unspecified: Secondary | ICD-10-CM | POA: Diagnosis present

## 2023-06-02 DIAGNOSIS — D751 Secondary polycythemia: Secondary | ICD-10-CM | POA: Diagnosis present

## 2023-06-02 DIAGNOSIS — R112 Nausea with vomiting, unspecified: Secondary | ICD-10-CM | POA: Diagnosis present

## 2023-06-02 DIAGNOSIS — R4182 Altered mental status, unspecified: Secondary | ICD-10-CM | POA: Diagnosis not present

## 2023-06-02 DIAGNOSIS — K219 Gastro-esophageal reflux disease without esophagitis: Secondary | ICD-10-CM | POA: Diagnosis present

## 2023-06-02 DIAGNOSIS — Z6841 Body Mass Index (BMI) 40.0 and over, adult: Secondary | ICD-10-CM

## 2023-06-02 DIAGNOSIS — R059 Cough, unspecified: Secondary | ICD-10-CM | POA: Diagnosis not present

## 2023-06-02 DIAGNOSIS — Z79899 Other long term (current) drug therapy: Secondary | ICD-10-CM

## 2023-06-02 DIAGNOSIS — Z87891 Personal history of nicotine dependence: Secondary | ICD-10-CM

## 2023-06-02 DIAGNOSIS — R531 Weakness: Secondary | ICD-10-CM

## 2023-06-02 DIAGNOSIS — Z7951 Long term (current) use of inhaled steroids: Secondary | ICD-10-CM

## 2023-06-02 DIAGNOSIS — Z83719 Family history of colon polyps, unspecified: Secondary | ICD-10-CM

## 2023-06-02 LAB — CBC WITH DIFFERENTIAL/PLATELET
Abs Immature Granulocytes: 0.06 10*3/uL (ref 0.00–0.07)
Basophils Absolute: 0 10*3/uL (ref 0.0–0.1)
Basophils Relative: 0 %
Eosinophils Absolute: 0 10*3/uL (ref 0.0–0.5)
Eosinophils Relative: 0 %
HCT: 53.8 % — ABNORMAL HIGH (ref 39.0–52.0)
Hemoglobin: 18.5 g/dL — ABNORMAL HIGH (ref 13.0–17.0)
Immature Granulocytes: 0 %
Lymphocytes Relative: 8 %
Lymphs Abs: 1.1 10*3/uL (ref 0.7–4.0)
MCH: 30.8 pg (ref 26.0–34.0)
MCHC: 34.4 g/dL (ref 30.0–36.0)
MCV: 89.5 fL (ref 80.0–100.0)
Monocytes Absolute: 0.8 10*3/uL (ref 0.1–1.0)
Monocytes Relative: 6 %
Neutro Abs: 11.4 10*3/uL — ABNORMAL HIGH (ref 1.7–7.7)
Neutrophils Relative %: 86 %
Platelets: 85 10*3/uL — ABNORMAL LOW (ref 150–400)
RBC: 6.01 MIL/uL — ABNORMAL HIGH (ref 4.22–5.81)
RDW: 14.6 % (ref 11.5–15.5)
WBC: 13.5 10*3/uL — ABNORMAL HIGH (ref 4.0–10.5)
nRBC: 0 % (ref 0.0–0.2)

## 2023-06-02 LAB — COMPREHENSIVE METABOLIC PANEL
ALT: 16 U/L (ref 0–44)
AST: 44 U/L — ABNORMAL HIGH (ref 15–41)
Albumin: 3.4 g/dL — ABNORMAL LOW (ref 3.5–5.0)
Alkaline Phosphatase: 53 U/L (ref 38–126)
Anion gap: 12 (ref 5–15)
BUN: 17 mg/dL (ref 8–23)
CO2: 19 mmol/L — ABNORMAL LOW (ref 22–32)
Calcium: 8.4 mg/dL — ABNORMAL LOW (ref 8.9–10.3)
Chloride: 109 mmol/L (ref 98–111)
Creatinine, Ser: 1.49 mg/dL — ABNORMAL HIGH (ref 0.61–1.24)
GFR, Estimated: 48 mL/min — ABNORMAL LOW (ref >60)
Glucose, Bld: 108 mg/dL — ABNORMAL HIGH (ref 70–99)
Potassium: 3.3 mmol/L — ABNORMAL LOW (ref 3.5–5.1)
Sodium: 140 mmol/L (ref 135–145)
Total Bilirubin: 1 mg/dL (ref 0.3–1.2)
Total Protein: 6.2 g/dL — ABNORMAL LOW (ref 6.5–8.1)

## 2023-06-02 LAB — RESP PANEL BY RT-PCR (RSV, FLU A&B, COVID)  RVPGX2
Influenza A by PCR: NEGATIVE
Influenza B by PCR: NEGATIVE
Resp Syncytial Virus by PCR: NEGATIVE
SARS Coronavirus 2 by RT PCR: NEGATIVE

## 2023-06-02 LAB — MAGNESIUM: Magnesium: 1.5 mg/dL — ABNORMAL LOW (ref 1.7–2.4)

## 2023-06-02 LAB — CK: Total CK: 1165 U/L — ABNORMAL HIGH (ref 49–397)

## 2023-06-02 LAB — TROPONIN I (HIGH SENSITIVITY)
Troponin I (High Sensitivity): 74 ng/L — ABNORMAL HIGH (ref <18)
Troponin I (High Sensitivity): 68 ng/L — ABNORMAL HIGH (ref <18)

## 2023-06-02 LAB — LIPASE, BLOOD: Lipase: 26 U/L (ref 11–51)

## 2023-06-02 LAB — BRAIN NATRIURETIC PEPTIDE: B Natriuretic Peptide: 146.2 pg/mL — ABNORMAL HIGH (ref 0.0–100.0)

## 2023-06-02 MED ORDER — MAGNESIUM SULFATE 2 GM/50ML IV SOLN
2.0000 g | Freq: Once | INTRAVENOUS | Status: AC
  Administered 2023-06-02: 2 g via INTRAVENOUS
  Filled 2023-06-02: qty 50

## 2023-06-02 MED ORDER — LACTATED RINGERS IV BOLUS
500.0000 mL | Freq: Once | INTRAVENOUS | Status: AC
  Administered 2023-06-02: 500 mL via INTRAVENOUS

## 2023-06-02 NOTE — ED Provider Notes (Signed)
[place] EMERGENCY DEPARTMENT AT [facility] Provider Note   CSN: [id number] Arrival date & time: 06/02/23  1941     History {Add pertinent medical, surgical, social history, OB history to HPI:1} Chief Complaint  Patient presents with   Altered Mental Status   Nausea   Cough   Shortness of Breath   Diarrhea   Vomiting    Justin Padilla is a 77 y.o. male.  77 year old male with a history of lower extremity edema on Lasix, OSA, morbid obesity, and hypertension who presents to the emergency department with diarrhea.  Patient reports that for the past few days has been having diarrhea.  Nonbloody nonmelanotic.  Did tell other providers that he had some nausea and vomiting but denies this to me.  Today felt very dizzy and weak and slumped over to the ground.  Did not hit his head or lose consciousness.  Not on any blood thinners.  Says that he was crawling around on the ground because he was unable to get up because he felt too weak and family members found him and called 911.  Has had a cough recently but denies any chest pain or shortness of breath.  No fevers.  No injuries as result of the fall.  Former smoker but no alcohol current tobacco use or drug use.  Family also reports that over the past 2 weeks has been on Lasix daily for increased lower extremity edema.  York Spaniel that he has been more confused than usual recently from this illness.       Home Medications Prior to Admission medications   Medication Sig Start Date End Date Taking? Authorizing Provider  Acetaminophen (TYLENOL PO) Take 2 tablets by mouth as needed.    [provider]  albuterol (VENTOLIN HFA) 108 (90 Base) MCG/ACT inhaler Inhale into the lungs every 6 (six) hours as needed for wheezing or shortness of breath.    [provider]  Fluticasone-Umeclidin-Vilant (TRELEGY ELLIPTA) 100-62.5-25 MCG/ACT AEPB Inhale 1 puff into the lungs daily. 01/29/23   Pincus Sanes, MD  furosemide (LASIX)  40 MG tablet Take 1 tablet (40 mg total) by mouth daily. 05/01/23   Pincus Sanes, MD  metoprolol tartrate (LOPRESSOR) 25 MG tablet Take 12.5 mg by mouth daily.    [provider]  Multiple Vitamins-Minerals (CENTRUM SILVER PO) Take 1 tablet by mouth daily.    [provider]  omeprazole (PRILOSEC) 20 MG capsule TAKE 1 CAPSULE BY MOUTH IN THE MORNING AND AT BEDTIME 02/24/23   Jenel Lucks, MD      Allergies    Trazodone and nefazodone    Review of Systems   Review of Systems  Physical Exam Updated Vital Signs BP 139/78 (BP Location: Left Arm)   Pulse (!) 114   Temp 98.3 F (36.8 C) (Oral)   Resp (!) 28   Ht 5\' 6"  (1.676 m)   Wt 124.7 kg   SpO2 93%   BMI 44.39 kg/m  Physical Exam Constitutional:      General: He is not in acute distress.    Appearance: Normal appearance. He is not ill-appearing.  HENT:     Head: Normocephalic and atraumatic.     Right Ear: External ear normal.     Left Ear: External ear normal.     Mouth/Throat:     Mouth: Mucous membranes are moist.     Pharynx: Oropharynx is clear.  Eyes:     Extraocular Movements: Extraocular movements intact.  Conjunctiva/sclera: Conjunctivae normal.     Pupils: Pupils are equal, round, and reactive to light.  Neck:     Comments: No C-spine midline tenderness to palpation Cardiovascular:     Rate and Rhythm: Regular rhythm. Tachycardia present.     Pulses: Normal pulses.     Heart sounds: Normal heart sounds.  Pulmonary:     Effort: Pulmonary effort is normal. No respiratory distress.     Breath sounds: Rales (Bibasilar) present.  Abdominal:     General: Abdomen is flat. There is distension.     Palpations: Abdomen is soft. There is no mass.     Tenderness: There is no abdominal tenderness. There is no guarding.  Musculoskeletal:        General: No deformity. Normal range of motion.     Cervical back: No rigidity or tenderness.     Right lower leg: Edema present.     Left lower  leg: Edema present.     Comments: No tenderness to palpation of midline thoracic or lumbar spine.  No step-offs palpated.  No tenderness to palpation of chest wall.  No bruising noted.  No tenderness to palpation of bilateral clavicles.  No tenderness to palpation, bruising, or deformities noted of bilateral shoulders, elbows, wrists, hips, knees, or ankles.  Neurological:     General: No focal deficit present.     Mental Status: He is alert and oriented to person, place, and time. Mental status is at baseline.     Cranial Nerves: No cranial nerve deficit.     Sensory: No sensory deficit.     Motor: No weakness.     ED Results / Procedures / Treatments   Labs (all labs ordered are listed, but only abnormal results are displayed) Labs Reviewed - No data to display  EKG None  Radiology No results found.  Procedures Procedures  {Document cardiac monitor, telemetry assessment procedure when appropriate:1}  Medications Ordered in ED Medications - No data to display  ED Course/ Medical Decision Making/ A&P   {   Click here for ABCD2, HEART and other calculatorsREFRESH Note before signing :1}                          Medical Decision Making Amount and/or Complexity of Data Reviewed Labs: ordered. Radiology: ordered.   ***  {Document critical care time when appropriate:1} {Document review of labs and clinical decision tools ie heart score, Chads2Vasc2 etc:1}  {Document your independent review of radiology images, and any outside records:1} {Document your discussion with family members, caretakers, and with consultants:1} {Document social determinants of health affecting pt's care:1} {Document your decision making why or why not admission, treatments were needed:1} Final Clinical Impression(s) / ED Diagnoses Final diagnoses:  None    Rx / DC Orders ED Discharge Orders     None

## 2023-06-02 NOTE — ED Notes (Signed)
Patient provided with urinal for sample. Patient verbalized understanding. Family at bedside.

## 2023-06-02 NOTE — ED Triage Notes (Signed)
Patient BIB [facility] from home with complaint of N/V/D x 2 days with fever, productive cough, confusion.   HR 120 RR30 89% RA placed on 4L [place] 96% 158/120 CBG 118  20G R Hand 18G L Hand

## 2023-06-03 DIAGNOSIS — G4733 Obstructive sleep apnea (adult) (pediatric): Secondary | ICD-10-CM | POA: Diagnosis not present

## 2023-06-03 DIAGNOSIS — Z832 Family history of diseases of the blood and blood-forming organs and certain disorders involving the immune mechanism: Secondary | ICD-10-CM | POA: Diagnosis not present

## 2023-06-03 DIAGNOSIS — Z96651 Presence of right artificial knee joint: Secondary | ICD-10-CM | POA: Diagnosis not present

## 2023-06-03 DIAGNOSIS — R112 Nausea with vomiting, unspecified: Secondary | ICD-10-CM | POA: Diagnosis not present

## 2023-06-03 DIAGNOSIS — R410 Disorientation, unspecified: Secondary | ICD-10-CM | POA: Diagnosis not present

## 2023-06-03 DIAGNOSIS — I77810 Thoracic aortic ectasia: Secondary | ICD-10-CM | POA: Diagnosis not present

## 2023-06-03 DIAGNOSIS — Z87891 Personal history of nicotine dependence: Secondary | ICD-10-CM | POA: Diagnosis not present

## 2023-06-03 DIAGNOSIS — R197 Diarrhea, unspecified: Secondary | ICD-10-CM | POA: Diagnosis not present

## 2023-06-03 DIAGNOSIS — N179 Acute kidney failure, unspecified: Secondary | ICD-10-CM | POA: Diagnosis not present

## 2023-06-03 DIAGNOSIS — K760 Fatty (change of) liver, not elsewhere classified: Secondary | ICD-10-CM | POA: Diagnosis not present

## 2023-06-03 DIAGNOSIS — W19XXXA Unspecified fall, initial encounter: Secondary | ICD-10-CM | POA: Diagnosis not present

## 2023-06-03 DIAGNOSIS — A09 Infectious gastroenteritis and colitis, unspecified: Secondary | ICD-10-CM | POA: Diagnosis not present

## 2023-06-03 DIAGNOSIS — Z91199 Patient's noncompliance with other medical treatment and regimen due to unspecified reason: Secondary | ICD-10-CM | POA: Diagnosis not present

## 2023-06-03 DIAGNOSIS — Z1152 Encounter for screening for COVID-19: Secondary | ICD-10-CM | POA: Diagnosis not present

## 2023-06-03 DIAGNOSIS — I5032 Chronic diastolic (congestive) heart failure: Secondary | ICD-10-CM | POA: Diagnosis not present

## 2023-06-03 DIAGNOSIS — M722 Plantar fascial fibromatosis: Secondary | ICD-10-CM | POA: Diagnosis not present

## 2023-06-03 DIAGNOSIS — D751 Secondary polycythemia: Secondary | ICD-10-CM

## 2023-06-03 DIAGNOSIS — Z800 Family history of malignant neoplasm of digestive organs: Secondary | ICD-10-CM | POA: Diagnosis not present

## 2023-06-03 DIAGNOSIS — I110 Hypertensive heart disease with heart failure: Secondary | ICD-10-CM | POA: Diagnosis not present

## 2023-06-03 DIAGNOSIS — Z885 Allergy status to narcotic agent status: Secondary | ICD-10-CM | POA: Diagnosis not present

## 2023-06-03 DIAGNOSIS — R4182 Altered mental status, unspecified: Secondary | ICD-10-CM | POA: Diagnosis not present

## 2023-06-03 DIAGNOSIS — T796XXA Traumatic ischemia of muscle, initial encounter: Secondary | ICD-10-CM | POA: Diagnosis not present

## 2023-06-03 DIAGNOSIS — I5021 Acute systolic (congestive) heart failure: Secondary | ICD-10-CM | POA: Diagnosis not present

## 2023-06-03 DIAGNOSIS — K219 Gastro-esophageal reflux disease without esophagitis: Secondary | ICD-10-CM | POA: Diagnosis not present

## 2023-06-03 DIAGNOSIS — D696 Thrombocytopenia, unspecified: Secondary | ICD-10-CM

## 2023-06-03 DIAGNOSIS — E876 Hypokalemia: Secondary | ICD-10-CM | POA: Diagnosis not present

## 2023-06-03 DIAGNOSIS — Z6841 Body Mass Index (BMI) 40.0 and over, adult: Secondary | ICD-10-CM | POA: Diagnosis not present

## 2023-06-03 DIAGNOSIS — B9689 Other specified bacterial agents as the cause of diseases classified elsewhere: Secondary | ICD-10-CM | POA: Diagnosis not present

## 2023-06-03 DIAGNOSIS — I10 Essential (primary) hypertension: Secondary | ICD-10-CM | POA: Diagnosis not present

## 2023-06-03 LAB — CBC
HCT: 50.6 % (ref 39.0–52.0)
Hemoglobin: 17.2 g/dL — ABNORMAL HIGH (ref 13.0–17.0)
MCH: 29.8 pg (ref 26.0–34.0)
MCHC: 34 g/dL (ref 30.0–36.0)
MCV: 87.5 fL (ref 80.0–100.0)
Platelets: 112 10*3/uL — ABNORMAL LOW (ref 150–400)
RBC: 5.78 MIL/uL (ref 4.22–5.81)
RDW: 14.5 % (ref 11.5–15.5)
WBC: 13.6 10*3/uL — ABNORMAL HIGH (ref 4.0–10.5)
nRBC: 0 % (ref 0.0–0.2)

## 2023-06-03 LAB — COMPREHENSIVE METABOLIC PANEL
ALT: 25 U/L (ref 0–44)
AST: 59 U/L — ABNORMAL HIGH (ref 15–41)
Albumin: 3.2 g/dL — ABNORMAL LOW (ref 3.5–5.0)
Alkaline Phosphatase: 48 U/L (ref 38–126)
Anion gap: 9 (ref 5–15)
BUN: 17 mg/dL (ref 8–23)
CO2: 20 mmol/L — ABNORMAL LOW (ref 22–32)
Calcium: 8.2 mg/dL — ABNORMAL LOW (ref 8.9–10.3)
Chloride: 110 mmol/L (ref 98–111)
Creatinine, Ser: 1.46 mg/dL — ABNORMAL HIGH (ref 0.61–1.24)
GFR, Estimated: 49 mL/min — ABNORMAL LOW (ref >60)
Glucose, Bld: 127 mg/dL — ABNORMAL HIGH (ref 70–99)
Potassium: 2.9 mmol/L — ABNORMAL LOW (ref 3.5–5.1)
Sodium: 139 mmol/L (ref 135–145)
Total Bilirubin: 0.4 mg/dL (ref 0.3–1.2)
Total Protein: 6 g/dL — ABNORMAL LOW (ref 6.5–8.1)

## 2023-06-03 LAB — GASTROINTESTINAL PANEL BY PCR, STOOL (REPLACES STOOL CULTURE)

## 2023-06-03 LAB — URINALYSIS, COMPLETE (UACMP) WITH MICROSCOPIC
Bacteria, UA: NONE SEEN
Bilirubin Urine: NEGATIVE
Glucose, UA: NEGATIVE mg/dL
Ketones, ur: NEGATIVE mg/dL
Leukocytes,Ua: NEGATIVE
Nitrite: NEGATIVE
Protein, ur: 100 mg/dL — AB
Specific Gravity, Urine: 1.02 (ref 1.005–1.030)
pH: 5 (ref 5.0–8.0)

## 2023-06-03 LAB — C DIFFICILE QUICK SCREEN W PCR REFLEX
C Diff antigen: NEGATIVE
C Diff interpretation: NOT DETECTED
C Diff toxin: NEGATIVE

## 2023-06-03 LAB — MAGNESIUM: Magnesium: 1.8 mg/dL (ref 1.7–2.4)

## 2023-06-03 LAB — DIC (DISSEMINATED INTRAVASCULAR COAGULATION)PANEL
D-Dimer, Quant: 3.35 ug/mL-FEU — ABNORMAL HIGH (ref 0.00–0.50)
Fibrinogen: 593 mg/dL — ABNORMAL HIGH (ref 210–475)
INR: 1.2 (ref 0.8–1.2)
Platelets: 113 10*3/uL — ABNORMAL LOW (ref 150–400)
Prothrombin Time: 15 seconds (ref 11.4–15.2)
Smear Review: NONE SEEN
aPTT: 31 seconds (ref 24–36)

## 2023-06-03 MED ORDER — ENOXAPARIN SODIUM 40 MG/0.4ML IJ SOSY
40.0000 mg | PREFILLED_SYRINGE | INTRAMUSCULAR | Status: DC
  Administered 2023-06-03 – 2023-06-08 (×6): 40 mg via SUBCUTANEOUS
  Filled 2023-06-03 (×6): qty 0.4

## 2023-06-03 MED ORDER — ONDANSETRON HCL 4 MG PO TABS: 4.0000 mg | ORAL_TABLET | Freq: Four times a day (QID) | ORAL | Status: DC | PRN

## 2023-06-03 MED ORDER — UMECLIDINIUM BROMIDE 62.5 MCG/ACT IN AEPB
1.0000 | INHALATION_SPRAY | Freq: Every day | RESPIRATORY_TRACT | Status: DC
  Administered 2023-06-05 – 2023-06-08 (×4): 1 via RESPIRATORY_TRACT
  Filled 2023-06-03: qty 7

## 2023-06-03 MED ORDER — POTASSIUM CHLORIDE CRYS ER 20 MEQ PO TBCR
40.0000 meq | EXTENDED_RELEASE_TABLET | ORAL | Status: AC
  Administered 2023-06-03 (×3): 40 meq via ORAL
  Filled 2023-06-03 (×3): qty 2

## 2023-06-03 MED ORDER — LACTATED RINGERS IV SOLN: INTRAVENOUS | Status: DC

## 2023-06-03 MED ORDER — LOPERAMIDE HCL 2 MG PO CAPS: 2.0000 mg | ORAL_CAPSULE | ORAL | Status: DC | PRN

## 2023-06-03 MED ORDER — LOPERAMIDE HCL 2 MG PO CAPS
4.0000 mg | ORAL_CAPSULE | Freq: Once | ORAL | Status: AC
  Administered 2023-06-03: 4 mg via ORAL
  Filled 2023-06-03: qty 2

## 2023-06-03 MED ORDER — ACETAMINOPHEN 325 MG PO TABS
650.0000 mg | ORAL_TABLET | Freq: Four times a day (QID) | ORAL | Status: DC | PRN
  Administered 2023-06-03 – 2023-06-04 (×2): 650 mg via ORAL
  Filled 2023-06-03 (×2): qty 2

## 2023-06-03 MED ORDER — ONDANSETRON HCL 4 MG/2ML IJ SOLN: 4.0000 mg | Freq: Four times a day (QID) | INTRAMUSCULAR | Status: DC | PRN

## 2023-06-03 MED ORDER — LOPERAMIDE HCL 2 MG PO CAPS
2.0000 mg | ORAL_CAPSULE | ORAL | Status: DC | PRN
  Administered 2023-06-04 (×3): 2 mg via ORAL
  Filled 2023-06-03 (×4): qty 1

## 2023-06-03 MED ORDER — ACETAMINOPHEN 650 MG RE SUPP: 650.0000 mg | Freq: Four times a day (QID) | RECTAL | Status: DC | PRN

## 2023-06-03 MED ORDER — FLUTICASONE FUROATE-VILANTEROL 100-25 MCG/ACT IN AEPB
1.0000 | INHALATION_SPRAY | Freq: Every day | RESPIRATORY_TRACT | Status: DC
  Administered 2023-06-04 – 2023-06-08 (×5): 1 via RESPIRATORY_TRACT
  Filled 2023-06-03: qty 28

## 2023-06-03 MED ORDER — POTASSIUM CHLORIDE CRYS ER 20 MEQ PO TBCR
40.0000 meq | EXTENDED_RELEASE_TABLET | Freq: Once | ORAL | Status: AC
  Administered 2023-06-03: 40 meq via ORAL
  Filled 2023-06-03: qty 2

## 2023-06-03 NOTE — Progress Notes (Signed)
New Admission Note:  Arrival Method: By bed from ED at 0300 Mental Orientation: Alert and oriented x 4, forgetful Telemetry: Box 9, CCMD notified Assessment: Completed Skin: Completed, refer to flowsheets IV: Left and Right hand S.L. Pain: Denies Tubes: None Safety Measures: Safety Fall Prevention Plan was given, discussed and signed. Admission: Completed [facility] Orientation: Patient has been orientated to the room, unit and the staff. Family: None  Orders have been reviewed and implemented. Will continue to monitor the patient. Call light has been placed within reach and bed alarm has been activated.   Franky Macho, RN  Phone Number: [phone]

## 2023-06-03 NOTE — ED Provider Notes (Signed)
  Provider Note MRN:  [id number]  Arrival date & time: 06/03/23    ED Course and Medical Decision Making  Assumed care from Dr. Eloise Harman at shift change.  Larey Seat and could not get up, no significant traumatic injuries, mild rhabdomyolysis, AKI, still a bit confused compared to baseline per family.  Admitted to medicine.  Procedures  Final Clinical Impressions(s) / ED Diagnoses     ICD-10-CM   1. Acute kidney injury [facility])  N17.9       ED Discharge Orders     None       Discharge Instructions   None     Elmer Sow. Pilar Plate, MD [facility] Emergency Medicine [facility] [email or web address]    Sabas Sous, MD 06/03/23 [phone]

## 2023-06-03 NOTE — ED Notes (Signed)
Attempted to call report to receiving RN, was informed to call back.

## 2023-06-03 NOTE — Assessment & Plan Note (Addendum)
H/o Polycythemia previously, though HGB in normal ranges during more recent months. HGB of 18.5 today which is elevated compared to baseline. Think that some of this may be dehydration in setting of diarrhea. Trend with daily CBC for the moment.

## 2023-06-03 NOTE — Plan of Care (Signed)
  Problem: Education: Goal: Knowledge of General Education information will improve Description Including pain rating scale, medication(s)/side effects and non-pharmacologic comfort measures Outcome: Progressing   

## 2023-06-03 NOTE — Assessment & Plan Note (Addendum)
Increased confusion from baseline in setting of acute illness. Probably very mild delirium.

## 2023-06-03 NOTE — Progress Notes (Addendum)
Patient admitted after midnight, please see H&P.  Here with diarrhea.  COVID negative Marlin Canary DO

## 2023-06-03 NOTE — TOC CM/SW Note (Signed)
Transition of Care [facility]) - Inpatient Brief Assessment   Patient Details  Name: Justin Padilla MRN: [id number] Date of Birth: Jun 02, 1946  [facility] of [facility]) CM/SW Contact:    Tom-Johnson, Hershal Coria, RN Phone Number: 06/03/2023, 2:20 PM   Clinical Narrative:  Patient presented to the ED with Nausea, Vomiting, Diarrhea, congestive cough and temp of 101.3. Has Hx of CHF with BLE Edema, on Lasix at home. Currently on 2L O2 acute and IVF. Medical workup continues.  From home alone, has one son who lives out of town. Has two supportive siblings. Retired, able to drive self prior to admission. Has a cane, walker and shower seat and grab bars at home.  PCP is Burns, Bobette Mo, MD and uses [facility] on [address].   No TOC needs or recommendations noted at this time. CM will continue to follow as patient progresses with care towards discharge.         Transition of Care Asessment: Insurance and Status: Insurance coverage has been reviewed Patient has primary care physician: Yes Home environment has been reviewed: Yes Prior level of function:: Modified [place] Prior/Current Home Services: No current home services Social Determinants of Health Reivew: SDOH reviewed no interventions necessary Readmission risk has been reviewed: Yes Transition of care needs: no transition of care needs at this time
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
## 2023-06-03 NOTE — Assessment & Plan Note (Signed)
Hold home Lasix Hold home metoprolol given soft BPs on presentation to ED.

## 2023-06-03 NOTE — Progress Notes (Signed)
C diff negative.  Await GI pathogen panel.  If negative or patient has worsening pain/ fever would get CT scan of abdomen/pelvis and start IV antibiotics for colitis.

## 2023-06-03 NOTE — Assessment & Plan Note (Addendum)
Likely pre-renal / ATN in setting of ongoing GI illness with persistent diarrhea and soft BPs on presentation to ED tonight. Doubt that CPK of just 1165 is contributing / causing pigmented nephropathy at such a low value. IVF Strict intake and output Check UA Repeat labs in AM Renal US and further work up if not rapidly improving.
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
## 2023-06-03 NOTE — ED Notes (Signed)
Called niece to update family of room assignment, no answer, left message.

## 2023-06-03 NOTE — Assessment & Plan Note (Addendum)
Pt with persistent diarrhea. Report of N/V but pt now denying this Presumed infectious at this point. GI pathogen pnl Gentle IVF given AKI + diarrhea, though pt does have edema and h/o CHF with chronic lasix usage so will need to watch for fluid overload with this. Replace Mg and K Repeat BMP and CBC in AM. Clear liquid diet to see if he can tolerate. No abd pain so holding off on CT scan. No recent ABx, CDiff seems less likely, regardless testing is now restricted to ID providers at [facility] so I cannot order this.

## 2023-06-03 NOTE — Assessment & Plan Note (Addendum)
Mild, slightly worse than baseline today at 85 (looks like he usually runs low 100s). Not clear on cause of this, but would just monitor for now and f/u outpt unless it gets worse during stay. Does have AMS and AKI with his diarrheal illness though... In abundance of caution will order DIC panel (primarily to rule out schistiocytes / TTP), though again, platelets today are only slightly decreased from baseline so feel that TTP / DIC is less likely.

## 2023-06-03 NOTE — Progress Notes (Signed)
C Diff order was added in for patient. Called lab to verify if C Diff can be done on stool already collected for GI panel. Lab states another specimen isn't needed.

## 2023-06-04 ENCOUNTER — Inpatient Hospital Stay (HOSPITAL_COMMUNITY): Payer: Medicare HMO

## 2023-06-04 DIAGNOSIS — R112 Nausea with vomiting, unspecified: Secondary | ICD-10-CM | POA: Diagnosis not present

## 2023-06-04 DIAGNOSIS — R197 Diarrhea, unspecified: Secondary | ICD-10-CM | POA: Diagnosis not present

## 2023-06-04 DIAGNOSIS — I5021 Acute systolic (congestive) heart failure: Secondary | ICD-10-CM | POA: Diagnosis not present

## 2023-06-04 LAB — BASIC METABOLIC PANEL
Anion gap: 10 (ref 5–15)
BUN: 16 mg/dL (ref 8–23)
CO2: 19 mmol/L — ABNORMAL LOW (ref 22–32)
Calcium: 8.2 mg/dL — ABNORMAL LOW (ref 8.9–10.3)
GFR, Estimated: 53 mL/min — ABNORMAL LOW (ref >60)
Glucose, Bld: 92 mg/dL (ref 70–99)
Potassium: 3.8 mmol/L (ref 3.5–5.1)
Sodium: 136 mmol/L (ref 135–145)

## 2023-06-04 LAB — CBC
HCT: 50.9 % (ref 39.0–52.0)
Hemoglobin: 17.1 g/dL — ABNORMAL HIGH (ref 13.0–17.0)
MCH: 29.3 pg (ref 26.0–34.0)
MCHC: 33.6 g/dL (ref 30.0–36.0)
MCV: 87.3 fL (ref 80.0–100.0)
RBC: 5.83 MIL/uL — ABNORMAL HIGH (ref 4.22–5.81)
RDW: 14.7 % (ref 11.5–15.5)
WBC: 11.1 10*3/uL — ABNORMAL HIGH (ref 4.0–10.5)
nRBC: 0 % (ref 0.0–0.2)

## 2023-06-04 LAB — PROCALCITONIN: Procalcitonin: 0.49 ng/mL

## 2023-06-04 LAB — ECHOCARDIOGRAM COMPLETE
AR max vel: 3.19 cm2
AV Peak grad: 11.3 mmHg
Ao pk vel: 1.68 m/s
Area-P 1/2: 4.21 cm2
Height: 66 in
S' Lateral: 2.2 cm
Weight: 4400 oz

## 2023-06-04 MED ORDER — SODIUM CHLORIDE 0.9 % IV SOLN
500.0000 mg | INTRAVENOUS | Status: DC
  Filled 2023-06-04: qty 5

## 2023-06-04 MED ORDER — VITAMIN B-12 1000 MCG PO TABS: 1000.0000 ug | ORAL_TABLET | Freq: Every day | ORAL | Status: DC

## 2023-06-04 MED ORDER — ORAL CARE MOUTH RINSE: 15.0000 mL | OROMUCOSAL | Status: DC | PRN

## 2023-06-04 MED ORDER — VITAMIN B-12 1000 MCG PO TABS
1000.0000 ug | ORAL_TABLET | Freq: Every day | ORAL | Status: DC
  Administered 2023-06-05 – 2023-06-06 (×2): 1000 ug via ORAL
  Filled 2023-06-04 (×2): qty 1

## 2023-06-04 MED ORDER — AZITHROMYCIN 500 MG PO TABS
500.0000 mg | ORAL_TABLET | Freq: Every day | ORAL | Status: AC
  Administered 2023-06-04 – 2023-06-06 (×3): 500 mg via ORAL
  Filled 2023-06-04 (×3): qty 1

## 2023-06-04 MED ORDER — RISAQUAD PO CAPS
2.0000 | ORAL_CAPSULE | Freq: Every day | ORAL | Status: DC
  Administered 2023-06-04 – 2023-06-08 (×5): 2 via ORAL
  Filled 2023-06-04 (×5): qty 2

## 2023-06-04 MED ORDER — PERFLUTREN LIPID MICROSPHERE
1.0000 mL | INTRAVENOUS | Status: AC | PRN
  Administered 2023-06-04: 6 mL via INTRAVENOUS

## 2023-06-04 NOTE — Progress Notes (Addendum)
Physical Therapy Evaluation Patient Details Name: Justin Padilla MRN: [id number] DOB: 15-Jan-1946 Today's Date: 06/04/2023  History of Present Illness  77 yo male with onset of N&V, diarrhea, was admitted on 7/24 for general weakness and SOB.  Noted he was unable to walk at admission, initially required a walker.  Pt has thrombocytopenia, polycythemia, AKI, campylobacter and initial confusion.  PMHx: OA, R TKA, OSA, rib fracture, SOB, GERD, fatty liver, colon polyp, HTN  Clinical Impression  Pt was seen for mobility on hallway with new issues of low endurance since his admission from home.  Pt has been up using BSC in room and noted his stability is good with initial standing.  Walk on hallway is stable and quality is probably baseline.  He is home alone and driving himself, but also able to care for himself until this issue.  Recommending outpatient therapy for follow up to increase and manage his control of energy and reduce chances of falling.  Has attentive and very helpful family as well and will recommend him to have mobility services as available for continued increase of mobility and energy.        Assistance Recommended at Discharge Set up Supervision/Assistance  If plan is discharge home, recommend the following:  Can travel by private vehicle  Other (comment) (outpatient therapy for endurance)        Equipment Recommendations None recommended by PT  Recommendations for Other Services       Functional Status Assessment Patient has had a recent decline in their functional status and demonstrates the ability to make significant improvements in function in a reasonable and predictable amount of time.     Precautions / Restrictions Precautions Precautions: Fall Precaution Comments: monitor sats and HR, supervise gait Restrictions Weight Bearing Restrictions: No      Mobility  Bed Mobility Overal bed mobility: Needs Assistance Bed Mobility: Supine to Sit     Supine to  sit: Supervision     General bed mobility comments: supervised for safety    Transfers Overall transfer level: Needs assistance Equipment used: None Transfers: Sit to/from Stand Sit to Stand: Supervision (for safety)           General transfer comment: noted his standing with hand placement on bed and safe sequence    Ambulation/Gait Ambulation/Gait assistance: Supervision, Min guard Gait Distance (Feet): 100 Feet Assistive device: None Gait Pattern/deviations: Step-through pattern, Decreased stride length, Wide base of support Gait velocity: controlled Gait velocity interpretation: <1.31 ft/sec, indicative of household ambulator Pre-gait activities: standing posture and balance ck General Gait Details: gait quality is likely baseline  Stairs            Wheelchair Mobility     Tilt Bed    Modified Rankin (Stroke Patients Only)       Balance Overall balance assessment: No apparent balance deficits (not formally assessed)                                           Pertinent Vitals/Pain Pain Assessment Pain Assessment: No/denies pain    Home Living Family/patient expects to be discharged to:: Private residence Living Arrangements: Alone Available Help at Discharge: Available PRN/intermittently;Family;Friend(s) Type of Home: House Home Access: Stairs to enter Entrance Stairs-Rails: Doctor, general practice of Steps: 3   Home Layout: One level Home Equipment: Agricultural consultant (2 wheels);Cane - single point;Grab bars - tub/shower;Shower seat Additional  Comments: attentive family visiting who are helpful    Prior Function Prior Level of Function : Independent/Modified Independent             Mobility Comments: driving and self mobilizing without AD       Hand Dominance   Dominant Hand: Right    Extremity/Trunk Assessment   Upper Extremity Assessment Upper Extremity Assessment: Overall WFL for tasks assessed     Lower Extremity Assessment Lower Extremity Assessment: Overall WFL for tasks assessed    Cervical / Trunk Assessment Cervical / Trunk Assessment: Other exceptions (body habitus)  Communication   Communication: No difficulties  Cognition Arousal/Alertness: Awake/alert Behavior During Therapy: WFL for tasks assessed/performed Overall Cognitive Status: Within Functional Limits for tasks assessed                                 General Comments: aware of the family coming to see him but did not know they were in waiting room        General Comments General comments (skin integrity, edema, etc.): Pt was noted to have sat 94% and pulse 94% with no symptoms of light headedness or SOB    Exercises     Assessment/Plan    PT Assessment Patient needs continued PT services  PT Problem List Decreased activity tolerance;Decreased knowledge of use of DME;Decreased safety awareness;Obesity       PT Treatment Interventions DME instruction;Gait training;Stair training;Functional mobility training;Therapeutic activities;Therapeutic exercise;Balance training;Neuromuscular re-education;Patient/family education    PT Goals (Current goals can be found in the Care Plan section)  Acute Rehab PT Goals Patient Stated Goal: to get moving again PT Goal Formulation: With patient Time For Goal Achievement: 06/11/23 Potential to Achieve Goals: Good    Frequency Min 1X/week     Co-evaluation               AM-PAC PT "6 Clicks" Mobility  Outcome Measure Help needed turning from your back to your side while in a flat bed without using bedrails?: None Help needed moving from lying on your back to sitting on the side of a flat bed without using bedrails?: A Little Help needed moving to and from a bed to a chair (including a wheelchair)?: A Little Help needed standing up from a chair using your arms (e.g., wheelchair or bedside chair)?: A Little Help needed to walk in hospital  room?: A Little Help needed climbing 3-5 steps with a railing? : A Little 6 Click Score: 19    End of Session Equipment Utilized During Treatment: Gait belt Activity Tolerance: Patient tolerated treatment well Patient left: in chair;with call bell/phone within reach;with family/visitor present Nurse Communication: Mobility status PT Visit Diagnosis: History of falling (Z91.81)    Time: 1610-9604 PT Time Calculation (min) (ACUTE ONLY): 20 min   Charges:   PT Evaluation $PT Eval Moderate Complexity: 1 Mod   PT General Charges $$ ACUTE PT VISIT: 1 Visit        Ivar Drape 06/04/2023, 2:16 PM  Samul Dada, PT PhD Acute Rehab Dept. Number: [facility] [id number] and [facility] [phone]

## 2023-06-04 NOTE — Progress Notes (Signed)
PROGRESS NOTE    Justin Padilla  WUJ:[id number] DOB: November 09, 1946 DOA: 06/02/2023 PCP: Pincus Sanes, MD    Brief Narrative:  Justin Padilla is a 77 y.o. male with medical history significant of peripheral edema on lasix, OSA, morbid obesity, HTN.   Pt in to ED with c/o diarrhea.    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 Probably very mild delirium. -will get CT head to r/o bleed from fall -B12 (last was <400)/ammonia  Thrombocytopenia (HCC) -trend, stable  Polycythemia H/o Polycythemia previously, though HGB in normal ranges during more recent months. HGB of 18.5 today which is elevated compared to baseline. Think that some of this may be dehydration in setting of diarrhea. -trending down  Essential hypertension Hold home Lasix Hold home metoprolol given soft BPs on presentation to ED.  Obesity Estimated body mass index is 44.39 kg/m as calculated from the following:   Height as of this encounter: 5\' 6"  (1.676 m).   Weight as of this encounter: 124.7 kg.     DVT prophylaxis: enoxaparin (LOVENOX) injection 40 mg Start: 06/03/23 1000    Code Status: Full Code Family Communication: met with niece and nephew   Disposition Plan:  Level of care: Med-Surg Status is: Inpatient Remains inpatient appropriate     Consultants:  none  Subjective: Says stools are more solid and less green  Objective: Vitals:   06/03/23 2306 06/04/23 0528 06/04/23 0600 06/04/23 0906  BP: (!) 106/57 (!) 105/50  96/68  Pulse: 100 (!) 113 99 97  Resp: 16 16 20 18   Temp: 97.8  F (36.6 C) 98.3 F (36.8 C)  98.3 F (36.8 C)  TempSrc: Oral Oral    SpO2: 91% 100% 100% 91%  Weight:      Height:        Intake/Output Summary (Last 24 hours) at 06/04/2023 1212 Last data filed at 06/04/2023 0800 Gross per 24 hour  Intake 1552.34 ml  Output 0 ml  Net 1552.34 ml   Filed Weights   06/02/23 1951  Weight: 124.7 kg    Examination:   General: Appearance:    Severely obese male in no acute distress     Lungs:    diminished, respirations unlabored  Heart:    Normal heart rate.     MS:   All extremities are intact.    Neurologic:   Awake, alert, oriented x 3       Data Reviewed: I have personally reviewed following labs and imaging studies  CBC: Recent Labs  Lab 06/02/23 2000 06/03/23 0836 06/04/23 0226  WBC 13.5* 13.6* 11.1*  NEUTROABS 11.4*  --   --   HGB 18.5* 17.2* 17.1*  HCT 53.8* 50.6 50.9  MCV 89.5 87.5 87.3  PLT 85* 113*  112* 102*   Basic Metabolic Panel: Recent Labs  Lab 06/02/23 2000 06/03/23 0836 06/04/23 0226  NA 140 139 136  K 3.3* 2.9* 3.8  CL 109 110 107  CO2 19* 20* 19*  GLUCOSE 108* 127* 92  BUN 17 17 16   CREATININE 1.49* 1.46* 1.36*  CALCIUM 8.4* 8.2* 8.2*  MG 1.5* 1.8  --    GFR: Estimated Creatinine Clearance: 55.8 mL/min (A) (by C-G formula based on SCr of 1.36 mg/dL (H)). Liver Function Tests: Recent Labs  Lab 06/02/23 2000 06/03/23 0836  AST 44* 59*  ALT 16 25  ALKPHOS 53 48  BILITOT 1.0 0.4  PROT 6.2* 6.0*  ALBUMIN 3.4* 3.2*   Recent Labs  Lab 06/02/23 2000  LIPASE 26   No results for input(s): "AMMONIA" in the last 168 hours. Coagulation Profile: Recent Labs  Lab 06/03/23 0836  INR 1.2   Cardiac Enzymes: Recent Labs  Lab 06/02/23 2000  CKTOTAL 1,165*   BNP (last 3 results) No results for input(s): "PROBNP" in the last 8760 hours. HbA1C: No results for input(s): "HGBA1C" in the last 72 hours. CBG: No results for input(s): "GLUCAP" in the last 168 hours. Lipid Profile: No  results for input(s): "CHOL", "HDL", "LDLCALC", "TRIG", "CHOLHDL", "LDLDIRECT" in the last 72 hours. Thyroid Function Tests: No results for input(s): "TSH", "T4TOTAL", "FREET4", "T3FREE", "THYROIDAB" in the last 72 hours. Anemia Panel: No results for input(s): "VITAMINB12", "FOLATE", "FERRITIN", "TIBC", "IRON", "RETICCTPCT" in the last 72 hours. Sepsis Labs: Recent Labs  Lab 06/04/23 0226  PROCALCITON 0.49    Recent Results (from the past 240 hour(s))  Resp panel by RT-PCR (RSV, Flu A&B, Covid) Anterior Nasal Swab     Status: None   Collection Time: 06/02/23  7:55 PM   Specimen: Anterior Nasal Swab  Result Value Ref Range Status   SARS Coronavirus 2 by RT PCR NEGATIVE NEGATIVE Final   Influenza A by PCR NEGATIVE NEGATIVE Final   Influenza B by PCR NEGATIVE NEGATIVE Final    Comment: (NOTE) The Xpert Xpress SARS-CoV-2/FLU/RSV plus assay is intended as an aid in the diagnosis of influenza from Nasopharyngeal swab specimens and should not be used as a sole basis for treatment. Nasal washings and aspirates are unacceptable for Xpert Xpress SARS-CoV-2/FLU/RSV testing.  Fact Sheet for Patients: BloggerCourse.com  Fact Sheet for Healthcare Providers: SeriousBroker.it  This test is not yet approved or cleared by the [place] FDA and has been authorized for detection and/or diagnosis of SARS-CoV-2 by FDA under an Emergency Use Authorization (EUA). This EUA will remain in effect (meaning this test can be used) for the duration of the COVID-19 declaration under Section 564(b)(1) of the Act, 21 U.S.C. section 360bbb-3(b)(1), unless the authorization is terminated or revoked.     Resp Syncytial Virus by PCR NEGATIVE NEGATIVE Final    Comment: (NOTE) Fact Sheet for Patients: BloggerCourse.com  Fact Sheet for Healthcare Providers: SeriousBroker.it  This test is not yet approved or  cleared by the [place] FDA and has been authorized for detection and/or diagnosis of SARS-CoV-2 by FDA under an Emergency Use Authorization (EUA). This EUA will remain in effect (meaning this test can be used) for the duration of the COVID-19 declaration under Section 564(b)(1) of the Act, 21 U.S.C. section 360bbb-3(b)(1), unless the authorization is terminated or revoked.  Performed at [facility], [address] [place], [place] [address]   Gastrointestinal Panel by PCR , Stool     Status: Abnormal   Collection Time: 06/03/23  6:27 AM   Specimen: Urine, Clean Catch; Stool  Result Value Ref Range Status   Campylobacter species DETECTED (A) NOT DETECTED Final    Comment: RESULT CALLED TO, READ BACK BY AND VERIFIED WITH: EMMANUEL CASTRO 06/03/23 2056 MU    Plesimonas shigelloides NOT DETECTED NOT DETECTED Final   Salmonella  species NOT DETECTED NOT DETECTED Final   Yersinia enterocolitica NOT DETECTED NOT DETECTED Final   Vibrio species NOT DETECTED NOT DETECTED Final   Vibrio cholerae NOT DETECTED NOT DETECTED Final   Enteroaggregative E coli (EAEC) NOT DETECTED NOT DETECTED Final   Enteropathogenic E coli (EPEC) NOT DETECTED NOT DETECTED Final   Enterotoxigenic E coli (ETEC) NOT DETECTED NOT DETECTED Final   Shiga like toxin producing E coli (STEC) NOT DETECTED NOT DETECTED Final   Shigella/Enteroinvasive E coli (EIEC) NOT DETECTED NOT DETECTED Final   Cryptosporidium NOT DETECTED NOT DETECTED Final   Cyclospora cayetanensis NOT DETECTED NOT DETECTED Final   Entamoeba histolytica NOT DETECTED NOT DETECTED Final   Giardia lamblia NOT DETECTED NOT DETECTED Final   Adenovirus F40/41 NOT DETECTED NOT DETECTED Final   Astrovirus NOT DETECTED NOT DETECTED Final   Norovirus GI/GII NOT DETECTED NOT DETECTED Final   Rotavirus A NOT DETECTED NOT DETECTED Final   Sapovirus (I, II, IV, and V) NOT DETECTED NOT DETECTED Final    Comment: Performed at [facility], [address] [place], [place] [address]  C Difficile Quick Screen w PCR reflex     Status: None   Collection Time: 06/03/23 11:01 AM   Specimen: STOOL  Result Value Ref Range Status   C Diff antigen NEGATIVE NEGATIVE Final   C Diff toxin NEGATIVE NEGATIVE Final   C Diff interpretation No C. difficile detected.  Final    Comment: Performed at [facility], [address] [place], [place] [address]         Radiology Studies: DG Chest Portable 1 View  Result Date: 06/02/2023 CLINICAL DATA:  Cough and shortness of breath for 2 days EXAM: PORTABLE CHEST 1 VIEW COMPARISON:  04/30/23 FINDINGS: Cardiac shadow is within normal limits. Lungs are well aerated bilaterally. No focal infiltrate or effusion is seen. No bony abnormality is noted. IMPRESSION: No active disease. Electronically Signed   By: Alcide Clever M.D.   On: 06/02/2023 20:58        Scheduled Meds:  acidophilus  2 capsule Oral Daily   azithromycin  500 mg Oral Daily   enoxaparin (LOVENOX) injection  40 mg Subcutaneous Q24H   fluticasone furoate-vilanterol  1 puff Inhalation Daily   And   umeclidinium bromide  1 puff Inhalation Daily   Continuous Infusions:   LOS: 1 day    Time spent: 45 minutes spent on chart review, discussion with nursing staff, consultants, updating family and interview/physical exam; more than 50% of that time was spent in counseling and/or coordination of care.    Joseph Art, DO [facility] Available via Epic secure chat 7am-7pm After these hours, please refer to coverage provider listed on [email or web address] 06/04/2023, 12:12 PM

## 2023-06-04 NOTE — Progress Notes (Signed)
Ok to change the azithromycin to 500mg  PO x3 days for campylobacter per Dr. Benjamine Mola.  Ulyses Southward, PharmD, BCIDP, AAHIVP, CPP Infectious Disease Pharmacist 06/04/2023 8:10 AM

## 2023-06-04 NOTE — Progress Notes (Signed)
Echocardiogram 2D Echocardiogram has been performed.  Justin Padilla 06/04/2023, 2:04 PM

## 2023-06-05 DIAGNOSIS — R112 Nausea with vomiting, unspecified: Secondary | ICD-10-CM | POA: Diagnosis not present

## 2023-06-05 DIAGNOSIS — R197 Diarrhea, unspecified: Secondary | ICD-10-CM | POA: Diagnosis not present

## 2023-06-05 MED ORDER — FUROSEMIDE 10 MG/ML IJ SOLN
20.0000 mg | Freq: Once | INTRAMUSCULAR | Status: AC
  Administered 2023-06-05: 20 mg via INTRAVENOUS
  Filled 2023-06-05: qty 2

## 2023-06-05 MED ORDER — MIDODRINE HCL 5 MG PO TABS
5.0000 mg | ORAL_TABLET | Freq: Three times a day (TID) | ORAL | Status: DC
  Administered 2023-06-05 – 2023-06-06 (×4): 5 mg via ORAL
  Filled 2023-06-05 (×4): qty 1

## 2023-06-05 MED ORDER — POTASSIUM CHLORIDE CRYS ER 20 MEQ PO TBCR
40.0000 meq | EXTENDED_RELEASE_TABLET | Freq: Once | ORAL | Status: AC
  Administered 2023-06-05: 40 meq via ORAL
  Filled 2023-06-05: qty 2

## 2023-06-05 NOTE — Care Management Important Message (Signed)
Important Message  Patient Details  Name: Justin Padilla MRN: [id number] Date of Birth: 02/05/1946   Medicare Important Message Given:  Yes     Arseniy Toomey Stefan Church 06/05/2023, 1:31 PM

## 2023-06-05 NOTE — Progress Notes (Signed)
PROGRESS NOTE    Justin Padilla  JYN:[id number] DOB: 24-Nov-1945 DOA: 06/02/2023 PCP: Pincus Sanes, MD    Brief Narrative:  Justin Padilla is a 77 y.o. male with medical history significant of peripheral edema on lasix, OSA, morbid obesity, HTN. Pt in to ED with c/o diarrhea. Now volume overloaded.  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 Probably very mild delirium. CT head WNL -B12 (last was <400)/ammonia slightly elevated -outpatient follow up with neurology  Thrombocytopenia (HCC) -trend, stable  Polycythemia H/o Polycythemia previously, though HGB in normal ranges during more recent months. HGB of 18.5 today which is elevated compared to baseline. Think that some of this may be dehydration in setting of diarrhea. -trending down  Essential hypertension Midodrine/IV lasix  Obesity Estimated body mass index is 43.48 kg/m as calculated from the following:   Height as of this encounter: 5\' 6"  (1.676 m).   Weight as of this encounter: 122.2 kg.   Hypokalemia -replete   DVT prophylaxis: enoxaparin (LOVENOX) injection 40 mg Start: 06/03/23 1000    Code Status: Full Code Family Communication: met with niece and nephew   Disposition Plan:  Level of care: Med-Surg Status is: Inpatient Remains inpatient appropriate     Consultants:  none  Subjective: Feeling well, no SOB  Objective: Vitals:   06/05/23 0525 06/05/23 0811 06/05/23 0838  06/05/23 1116  BP: (!) 98/49  132/73   Pulse: 61  86   Resp: 16  18   Temp: (!) 97.4 F (36.3 C)  98.6 F (37 C)   TempSrc: Oral  Oral   SpO2: (!) 89% 92% 90%   Weight:    122.2 kg  Height:        Intake/Output Summary (Last 24 hours) at 06/05/2023 1211 Last data filed at 06/05/2023 0800 Gross per 24 hour  Intake 780 ml  Output --  Net 780 ml   Filed Weights   06/02/23 1951 06/05/23 1116  Weight: 124.7 kg 122.2 kg    Examination:   General: Appearance:    Severely obese male in no acute distress     Lungs:    diminished, respirations unlabored  Heart:    Normal heart rate.  + LE edma-- pitting   MS:   All extremities are intact.    Neurologic:   Awake, alert, oriented x 3       Data Reviewed: I have personally reviewed following labs and imaging studies  CBC: Recent Labs  Lab 06/02/23 2000 06/03/23 0836 06/04/23 0226 06/05/23 0305  WBC 13.5* 13.6* 11.1* 7.4  NEUTROABS 11.4*  --   --   --   HGB 18.5* 17.2* 17.1* 16.6  HCT 53.8* 50.6 50.9 50.0  MCV 89.5 87.5 87.3 89.1  PLT 85* 113*  112* 102* 98*   Basic Metabolic Panel: Recent Labs  Lab 06/02/23 2000 06/03/23 0836 06/04/23 0226 06/05/23 0305  NA 140 139 136 140  K 3.3* 2.9* 3.8 3.1*  CL 109 110 107 109  CO2  19* 20* 19* 24  GLUCOSE 108* 127* 92 83  BUN 17 17 16 16   CREATININE 1.49* 1.46* 1.36* 1.28*  CALCIUM 8.4* 8.2* 8.2* 8.2*  MG 1.5* 1.8  --   --    GFR: Estimated Creatinine Clearance: 58.7 mL/min (A) (by C-G formula based on SCr of 1.28 mg/dL (H)). Liver Function Tests: Recent Labs  Lab 06/02/23 2000 06/03/23 0836  AST 44* 59*  ALT 16 25  ALKPHOS 53 48  BILITOT 1.0 0.4  PROT 6.2* 6.0*  ALBUMIN 3.4* 3.2*   Recent Labs  Lab 06/02/23 2000  LIPASE 26   Recent Labs  Lab 06/05/23 0305  AMMONIA 38*   Coagulation Profile: Recent Labs  Lab 06/03/23 0836  INR 1.2   Cardiac Enzymes: Recent Labs  Lab 06/02/23 2000 06/05/23 0305  CKTOTAL 1,165* 2,529*   BNP (last 3  results) No results for input(s): "PROBNP" in the last 8760 hours. HbA1C: No results for input(s): "HGBA1C" in the last 72 hours. CBG: No results for input(s): "GLUCAP" in the last 168 hours. Lipid Profile: No results for input(s): "CHOL", "HDL", "LDLCALC", "TRIG", "CHOLHDL", "LDLDIRECT" in the last 72 hours. Thyroid Function Tests: No results for input(s): "TSH", "T4TOTAL", "FREET4", "T3FREE", "THYROIDAB" in the last 72 hours. Anemia Panel: Recent Labs    06/05/23 0305  VITAMINB12 435   Sepsis Labs: Recent Labs  Lab 06/04/23 0226 06/05/23 0305  PROCALCITON 0.49 0.33    Recent Results (from the past 240 hour(s))  Resp panel by RT-PCR (RSV, Flu A&B, Covid) Anterior Nasal Swab     Status: None   Collection Time: 06/02/23  7:55 PM   Specimen: Anterior Nasal Swab  Result Value Ref Range Status   SARS Coronavirus 2 by RT PCR NEGATIVE NEGATIVE Final   Influenza A by PCR NEGATIVE NEGATIVE Final   Influenza B by PCR NEGATIVE NEGATIVE Final    Comment: (NOTE) The Xpert Xpress SARS-CoV-2/FLU/RSV plus assay is intended as an aid in the diagnosis of influenza from Nasopharyngeal swab specimens and should not be used as a sole basis for treatment. Nasal washings and aspirates are unacceptable for Xpert Xpress SARS-CoV-2/FLU/RSV testing.  Fact Sheet for Patients: BloggerCourse.com  Fact Sheet for Healthcare Providers: SeriousBroker.it  This test is not yet approved or cleared by the [place] FDA and has been authorized for detection and/or diagnosis of SARS-CoV-2 by FDA under an Emergency Use Authorization (EUA). This EUA will remain in effect (meaning this test can be used) for the duration of the COVID-19 declaration under Section 564(b)(1) of the Act, 21 U.S.C. section 360bbb-3(b)(1), unless the authorization is terminated or revoked.     Resp Syncytial Virus by PCR NEGATIVE NEGATIVE Final    Comment: (NOTE) Fact Sheet  for Patients: BloggerCourse.com  Fact Sheet for Healthcare Providers: SeriousBroker.it  This test is not yet approved or cleared by the [place] FDA and has been authorized for detection and/or diagnosis of SARS-CoV-2 by FDA under an Emergency Use Authorization (EUA). This EUA will remain in effect (meaning this test can be used) for the duration of the COVID-19 declaration under Section 564(b)(1) of the Act, 21 U.S.C. section 360bbb-3(b)(1), unless the authorization is terminated or revoked.  Performed at [facility], [address] [place], [place] [address]   Gastrointestinal Panel by PCR , Stool     Status: Abnormal   Collection Time: 06/03/23  6:27 AM   Specimen: Urine, Clean Catch; Stool  Result Value Ref Range Status   Campylobacter species  DETECTED (A) NOT DETECTED Final    Comment: RESULT CALLED TO, READ BACK BY AND VERIFIED WITH: EMMANUEL CASTRO 06/03/23 2056 MU    Plesimonas shigelloides NOT DETECTED NOT DETECTED Final   Salmonella species NOT DETECTED NOT DETECTED Final   Yersinia enterocolitica NOT DETECTED NOT DETECTED Final   Vibrio species NOT DETECTED NOT DETECTED Final   Vibrio cholerae NOT DETECTED NOT DETECTED Final   Enteroaggregative E coli (EAEC) NOT DETECTED NOT DETECTED Final   Enteropathogenic E coli (EPEC) NOT DETECTED NOT DETECTED Final   Enterotoxigenic E coli (ETEC) NOT DETECTED NOT DETECTED Final   Shiga like toxin producing E coli (STEC) NOT DETECTED NOT DETECTED Final   Shigella/Enteroinvasive E coli (EIEC) NOT DETECTED NOT DETECTED Final   Cryptosporidium NOT DETECTED NOT DETECTED Final   Cyclospora cayetanensis NOT DETECTED NOT DETECTED Final   Entamoeba histolytica NOT DETECTED NOT DETECTED Final   Giardia lamblia NOT DETECTED NOT DETECTED Final   Adenovirus F40/41 NOT DETECTED NOT DETECTED Final   Astrovirus NOT DETECTED NOT DETECTED Final   Norovirus GI/GII NOT DETECTED NOT  DETECTED Final   Rotavirus A NOT DETECTED NOT DETECTED Final   Sapovirus (I, II, IV, and V) NOT DETECTED NOT DETECTED Final    Comment: Performed at [facility], [address] [place], [place] [address]  C Difficile Quick Screen w PCR reflex     Status: None   Collection Time: 06/03/23 11:01 AM   Specimen: STOOL  Result Value Ref Range Status   C Diff antigen NEGATIVE NEGATIVE Final   C Diff toxin NEGATIVE NEGATIVE Final   C Diff interpretation No C. difficile detected.  Final    Comment: Performed at [facility], [address], [place], [place] [address]         Radiology Studies: CT HEAD WO CONTRAST ( )  Result Date: 06/04/2023 CLINICAL DATA:  Mental status change, unknown cause. EXAM: CT HEAD WITHOUT CONTRAST TECHNIQUE: Contiguous axial images were obtained from the base of the skull through the vertex without intravenous contrast. RADIATION DOSE REDUCTION: This exam was performed according to the departmental dose-optimization program which includes automated exposure control, adjustment of the mA and/or kV according to patient size and/or use of iterative reconstruction technique. COMPARISON:  None Available. FINDINGS: Brain: No acute infarct, hemorrhage, or mass lesion is present. The ventricles are of normal size. Deep brain nuclei are within normal limits. No significant extraaxial fluid collection is present. The brainstem and cerebellum are within normal limits. Midline structures are within normal limits. Vascular: No hyperdense vessel or unexpected calcification. Skull: Calvarium is intact. No focal lytic or blastic lesions are present. No significant extracranial soft tissue lesion is present. Sinuses/Orbits: The paranasal sinuses and mastoid air cells are clear. IMPRESSION: Negative CT of the head. Electronically Signed   By: Marin Roberts M.D.   On: 06/04/2023 18:32   ECHOCARDIOGRAM COMPLETE  Result Date: 06/04/2023    ECHOCARDIOGRAM REPORT    Patient Name:   Justin Padilla Date of Exam: 06/04/2023 Medical Rec #:  [id number]        Height:       66.0 in Accession #:    [id number]       Weight:       275.0 lb Date of Birth:  06-11-1946        BSA:          2.290 m Patient Age:    77 years         BP:  96/68 mmHg Patient Gender: M                HR:           94 bpm. Exam Location:  Inpatient Procedure: 2D Echo, Cardiac Doppler, Color Doppler and Intracardiac            Opacification Agent Indications:     CHF-Acute Systolic I50.21  History:         Patient has prior history of Echocardiogram examinations, most                  recent 01/30/2022. CHF, Arrythmias:RBBB,                  Signs/Symptoms:Shortness of Breath; Risk Factors:Hypertension                  and Sleep Apnea.  Sonographer:     Lucendia Herrlich Referring Phys:  [id number] Luz Burcher Juanetta Gosling Diagnosing Phys: Freida Busman McleanMD IMPRESSIONS  1. Left ventricular ejection fraction, by estimation, is 65 to 70%. The left ventricle has hyperdynamic function. The left ventricle has no regional wall motion abnormalities. Left ventricular diastolic parameters are consistent with Grade I diastolic dysfunction (impaired relaxation).  2. Mildly D-shaped interventricular septum suggests a degree of RV pressure/volume overload. Right ventricular systolic function is normal. The right ventricular size is normal. Tricuspid regurgitation signal is inadequate for assessing PA pressure.  3. The mitral valve is normal in structure. No evidence of mitral valve regurgitation. No evidence of mitral stenosis.  4. The aortic valve is tricuspid. There is mild calcification of the aortic valve. Aortic valve regurgitation is not visualized. No aortic stenosis is present.  5. Aortic dilatation noted. 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  6. The inferior vena cava is dilated in size with >50% respiratory variability, suggesting right  atrial pressure of 8 mmHg. FINDINGS  Left Ventricle: Left ventricular ejection fraction, by estimation, is 65 to 70%. The left ventricle has hyperdynamic function. The left ventricle has no regional wall motion abnormalities. Definity contrast agent was given IV to delineate the left ventricular endocardial borders. The left ventricular internal cavity size was normal in size. There is no left ventricular hypertrophy. Left ventricular diastolic parameters are consistent with Grade I diastolic dysfunction (impaired relaxation). Right Ventricle: Mildly D-shaped interventricular septum suggests a degree of RV pressure/volume overload. The right ventricular size is normal. No increase in right ventricular wall thickness. Right ventricular systolic function is normal. Tricuspid regurgitation signal is inadequate for assessing PA pressure. Left Atrium: Left atrial size was normal in size. Right Atrium: Right atrial size was normal in size. Pericardium: There is no evidence of pericardial effusion. Mitral Valve: The mitral valve is normal in structure. Mild mitral annular calcification. No evidence of mitral valve regurgitation. No evidence of mitral valve stenosis. Tricuspid Valve: The tricuspid valve is normal in structure. Tricuspid valve regurgitation is not demonstrated. Aortic Valve: The aortic valve is tricuspid. There is mild calcification of the aortic valve. Aortic valve regurgitation is not visualized. No aortic stenosis is present. Aortic valve peak gradient measures 11.3 mmHg. Pulmonic Valve: The pulmonic valve was not well visualized. Pulmonic valve regurgitation is not visualized. Aorta: Aortic dilatation noted. 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 Venous: The inferior vena cava is dilated in size with greater  than 50% respiratory variability, suggesting right atrial pressure of 8 mmHg. IAS/Shunts: No atrial level  shunt detected by color flow Doppler.  LEFT VENTRICLE PLAX 2D LVIDd:         3.30 cm   Diastology LVIDs:         2.20 cm   LV e' medial:    8.55 cm/s LV PW:         1.30 cm   LV E/e' medial:  8.6 LV IVS:        1.70 cm   LV e' lateral:   9.17 cm/s LVOT diam:     2.30 cm   LV E/e' lateral: 8.0 LV SV:         85 LV SV Index:   37 LVOT Area:     4.15 cm  RIGHT VENTRICLE             IVC RV S prime:     16.50 cm/s  IVC diam: 2.10 cm LEFT ATRIUM           Index        RIGHT ATRIUM           Index LA diam:      4.50 cm 1.97 cm/m   RA Area:     20.60 cm LA Vol (A2C): 46.9 ml 20.48 ml/m  RA Volume:   56.90 ml  24.85 ml/m LA Vol (A4C): 49.2 ml 21.49 ml/m  AORTIC VALVE                 PULMONIC VALVE AV Area (Vmax): 3.19 cm     PR End Diast Vel: 10.50 msec AV Vmax:        168.00 cm/s AV Peak Grad:   11.3 mmHg LVOT Vmax:      129.00 cm/s LVOT Vmean:     82.433 cm/s LVOT VTI:       0.205 m  AORTA Ao Root diam: 4.00 cm Ao Asc diam:  4.10 cm MITRAL VALVE MV Area (PHT): 4.21 cm     SHUNTS MV Decel Time: 180 msec     Systemic VTI:  0.20 m MV E velocity: 73.60 cm/s   Systemic Diam: 2.30 cm MV A velocity: 115.00 cm/s MV E/A ratio:  0.64 Dalton McleanMD Electronically signed by Wilfred Lacy Signature Date/Time: 06/04/2023/4:48:39 PM    Final (Updated)         Scheduled Meds:  acidophilus  2 capsule Oral Daily   azithromycin  500 mg Oral Daily   vitamin B-12  1,000 mcg Oral Daily   enoxaparin (LOVENOX) injection  40 mg Subcutaneous Q24H   fluticasone furoate-vilanterol  1 puff Inhalation Daily   And   umeclidinium bromide  1 puff Inhalation Daily   midodrine  5 mg Oral TID WC   Continuous Infusions:   LOS: 2 days    Time spent: 45 minutes spent on chart review, discussion with nursing staff, consultants, updating family and interview/physical exam; more than 50% of that time was spent in counseling and/or coordination of care.    Joseph Art, DO [facility] Available via Epic secure chat  7am-7pm After these hours, please refer to coverage provider listed on [email or web address] 06/05/2023, 12:11 PM

## 2023-06-05 NOTE — TOC Progression Note (Signed)
Transition of Care [facility]) - Progression Note    Patient Details  Name: Justin Padilla MRN: [id number] Date of Birth: [birth year]  [facility] CM/SW Contact  Tom-Johnson, Hershal Coria, RN Phone Number: 06/05/2023, 2:34 PM  Clinical Narrative:     CM spoke with patient at bedside about outpatient PT recommendation. Patient states he is active at [facility] and would like to use their therapy services. Order placed and info on AVS.   CM will continue to follow as patient progresses with care towards discharge.  Expected Discharge Plan and Services                                               Social Determinants of Health (SDOH) Interventions SDOH Screenings   Food Insecurity: No Food Insecurity (08/21/2022)  Housing: Low Risk  (08/21/2022)  Transportation Needs: No Transportation Needs (08/21/2022)  Utilities: Not At Risk (08/21/2022)  Alcohol Screen: Low Risk  (08/21/2022)  Depression (PHQ2-9): Low Risk  (05/01/2023)  Financial Resource Strain: Low Risk  (08/21/2022)  Physical Activity: Sufficiently Active (08/21/2022)  Social Connections: Moderately Isolated (08/21/2022)  Stress: No Stress Concern Present (08/21/2022)  Tobacco Use: Medium Risk (06/02/2023)    Readmission Risk Interventions    06/03/2023    2:19 PM  Readmission Risk Prevention Plan  Post Dischage Appt Complete  Medication Screening Complete  Transportation Screening Complete

## 2023-06-05 NOTE — Progress Notes (Signed)
Patient unable to tolerate CPAP.  Alert and oriented x 4. Needs attended.

## 2023-06-05 NOTE — Plan of Care (Signed)
  Problem: Education: Goal: Knowledge of General Education information will improve Description: Including pain rating scale, medication(s)/side effects and non-pharmacologic comfort measures Outcome: Completed/Met

## 2023-06-05 NOTE — Progress Notes (Signed)
Refused his CPAP to get readjusted. Will continue to monitor.

## 2023-06-06 DIAGNOSIS — R112 Nausea with vomiting, unspecified: Secondary | ICD-10-CM | POA: Diagnosis not present

## 2023-06-06 DIAGNOSIS — R197 Diarrhea, unspecified: Secondary | ICD-10-CM | POA: Diagnosis not present

## 2023-06-06 LAB — TSH: TSH: 2.211 u[IU]/mL (ref 0.350–4.500)

## 2023-06-06 MED ORDER — POTASSIUM CHLORIDE CRYS ER 20 MEQ PO TBCR
40.0000 meq | EXTENDED_RELEASE_TABLET | Freq: Once | ORAL | Status: AC
  Administered 2023-06-06: 40 meq via ORAL
  Filled 2023-06-06: qty 2

## 2023-06-06 NOTE — Progress Notes (Signed)
   06/06/23 2000  BiPAP/CPAP/SIPAP  Reason BIPAP/CPAP not in use Non-compliant   Patient refused use of CPAP for the evening.

## 2023-06-06 NOTE — Progress Notes (Signed)
Physical Therapy Treatment Patient Details Name: Justin Padilla MRN: [id number] DOB: Mar 20, 1946 Today's Date: 06/06/2023   History of Present Illness 77 yo male with onset of N&V, diarrhea, was admitted on 7/24 for general weakness and SOB.  Noted he was unable to walk at admission, initially required a walker.  Pt has thrombocytopenia, polycythemia, AKI, campylobacter and initial confusion.  PMHx: OA, R TKA, OSA, rib fracture, SOB, GERD, fatty liver, colon polyp, HTN    PT Comments  Pt seated in recliner on arrival. He continues to benefit from skilled rehab in OP setting to improve endurance.  He remains motivated and able to progress to stair training this session.       Assistance Recommended at Discharge    If plan is discharge home, recommend the following:  Can travel by private vehicle    Other (comment) (OP PT for endurance)      Equipment Recommendations  None recommended by PT    Recommendations for Other Services       Precautions / Restrictions Precautions Precautions: Fall Restrictions Weight Bearing Restrictions: No     Mobility  Bed Mobility               General bed mobility comments: in recliner    Transfers Overall transfer level: Needs assistance Equipment used: None Transfers: Sit to/from Stand Sit to Stand: Modified independent (Device/Increase time)                Ambulation/Gait Ambulation/Gait assistance: Supervision Gait Distance (Feet): 125 Feet Assistive device: None Gait Pattern/deviations: Step-through pattern, Decreased stride length, Wide base of support Gait velocity: controlled     General Gait Details: gait quality is likely baseline, he continues to present with increased WOB with mobility.   Stairs Stairs: Yes Stairs assistance: Supervision Stair Management: Two rails Number of Stairs: 10 General stair comments: Pt performed impulsively but no LOB noted.  Increased WOB after stair  negotiation   Wheelchair Mobility     Tilt Bed    Modified Rankin (Stroke Patients Only)       Balance Overall balance assessment: No apparent balance deficits (not formally assessed)                                          Cognition Arousal/Alertness: Awake/alert Behavior During Therapy: WFL for tasks assessed/performed Overall Cognitive Status: Within Functional Limits for tasks assessed                                          Exercises      General Comments        Pertinent Vitals/Pain Pain Assessment Pain Assessment: No/denies pain    Home Living                          Prior Function            PT Goals (current goals can now be found in the care plan section) Acute Rehab PT Goals Patient Stated Goal: to get moving again Potential to Achieve Goals: Good Progress towards PT goals: Progressing toward goals    Frequency    Min 1X/week      PT Plan Current plan remains appropriate    Co-evaluation  AM-PAC PT "6 Clicks" Mobility   Outcome Measure  Help needed turning from your back to your side while in a flat bed without using bedrails?: None Help needed moving from lying on your back to sitting on the side of a flat bed without using bedrails?: None Help needed moving to and from a bed to a chair (including a wheelchair)?: None Help needed standing up from a chair using your arms (e.g., wheelchair or bedside chair)?: None Help needed to walk in hospital room?: A Little Help needed climbing 3-5 steps with a railing? : A Little 6 Click Score: 22    End of Session Equipment Utilized During Treatment: Gait belt Activity Tolerance: Patient tolerated treatment well Patient left: in chair;with call bell/phone within reach;with family/visitor present Nurse Communication: Mobility status PT Visit Diagnosis: History of falling (Z91.81)     Time: 4098-1191 PT Time Calculation  (min) (ACUTE ONLY): 8 min  Charges:    $Gait Training: 8-22 mins PT General Charges $$ ACUTE PT VISIT: 1 Visit                     Bonney Leitz , PTA Acute Rehabilitation Services Office [phone]    Florestine Avers 06/06/2023, 4:06 PM
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
## 2023-06-06 NOTE — Progress Notes (Addendum)
PROGRESS NOTE    Justin Padilla  ZOX:[id number] DOB: 04/09/1946 DOA: 06/02/2023 PCP: Pincus Sanes, MD    Brief Narrative:  Justin Padilla is a 77 y.o. male with medical history significant of peripheral edema on lasix, OSA, morbid obesity, HTN. Pt in to ED with c/o diarrhea.  volume overloaded (family says has been having swelling for a while).   IV lasix, nutrition consulted for education.     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 CT head WNL -B12 low 400s/ammonia slightly elevated -would encourage compliance with CPAP -outpatient follow up with neurology  Thrombocytopenia (HCC) -trend, stable  Polycythemia H/o Polycythemia previously, though HGB in normal ranges during more recent months. HGB of 18.5 today which is elevated compared to baseline. -suspect related to CPAP noncompliance  Essential hypertension Midodrine/IV lasix -hold BB for now  Obesity Estimated body mass index is 43.45 kg/m as calculated from the following:   Height as of this encounter: 5\' 6"  (1.676 m).   Weight as of this encounter: 122.1 kg.   Hypokalemia -replete  OSA -not compliant with CPAP per family  Appears to be nearing d/c and optimal hospitalization management: -will need neurology follow up-- referral placed -Outpatient podiatry referral for toenail care- referral placed -close PCP and cardiology follow up -needs increased  activity -compliance with CPAP  -low salt diet (family says eats salty snacks all day)   DVT prophylaxis: enoxaparin (LOVENOX) injection 40 mg Start: 06/03/23 1000    Code Status: Full Code Family Communication: called niece and nephew   Disposition Plan:  Level of care: Med-Surg Status is: Inpatient Remains inpatient appropriate     Consultants:  cards  Subjective: Feeling well, no SOB  Objective: Vitals:   06/06/23 0141 06/06/23 0426 06/06/23 0826 06/06/23 1050  BP:  117/68 102/73   Pulse:  73 81   Resp:  16 16   Temp:  97.7 F (36.5 C) 97.8 F (36.6 C)   TempSrc:  Oral Oral   SpO2: 93% 92% 92%   Weight:    122.1 kg  Height:        Intake/Output Summary (Last 24 hours) at 06/06/2023 1307 Last data filed at 06/06/2023 0945 Gross per 24 hour  Intake 720 ml  Output 500 ml  Net 220 ml   Filed Weights   06/02/23 1951 06/05/23 1116 06/06/23 1050  Weight: 124.7 kg 122.2 kg 122.1 kg    Examination:   General: Appearance:    Severely obese male in no acute distress     Lungs:    diminished, respirations unlabored  Heart:    Normal heart rate.  + LE edma-- pitting   MS:   All extremities are intact.    Neurologic:   Awake, alert, oriented x 3       Data Reviewed: I have personally reviewed following labs and imaging studies  CBC: Recent Labs  Lab 06/02/23 2000 06/03/23 0836 06/04/23 0226 06/05/23 0305  WBC 13.5* 13.6* 11.1* 7.4  NEUTROABS  11.4*  --   --   --   HGB 18.5* 17.2* 17.1* 16.6  HCT 53.8* 50.6 50.9 50.0  MCV 89.5 87.5 87.3 89.1  PLT 85* 113*  112* 102* 98*   Basic Metabolic Panel: Recent Labs  Lab 06/02/23 2000 06/03/23 0836 06/04/23 0226 06/05/23 0305 06/06/23 0153  NA 140 139 136 140 138  K 3.3* 2.9* 3.8 3.1* 3.2*  CL 109 110 107 109 106  CO2 19* 20* 19* 24 23  GLUCOSE 108* 127* 92 83 93  BUN 17 17 16 16 17   CREATININE 1.49* 1.46* 1.36* 1.28* 1.21  CALCIUM 8.4* 8.2* 8.2* 8.2* 8.1*  MG 1.5* 1.8  --   --   --     GFR: Estimated Creatinine Clearance: 62 mL/min (by C-G formula based on SCr of 1.21 mg/dL). Liver Function Tests: Recent Labs  Lab 06/02/23 2000 06/03/23 0836  AST 44* 59*  ALT 16 25  ALKPHOS 53 48  BILITOT 1.0 0.4  PROT 6.2* 6.0*  ALBUMIN 3.4* 3.2*   Recent Labs  Lab 06/02/23 2000  LIPASE 26   Recent Labs  Lab 06/05/23 0305  AMMONIA 38*   Coagulation Profile: Recent Labs  Lab 06/03/23 0836  INR 1.2   Cardiac Enzymes: Recent Labs  Lab 06/02/23 2000 06/05/23 0305 06/06/23 0153  CKTOTAL 1,165* 2,529* 2,866*   BNP (last 3 results) No results for input(s): "PROBNP" in the last 8760 hours. HbA1C: No results for input(s): "HGBA1C" in the last 72 hours. CBG: No results for input(s): "GLUCAP" in the last 168 hours. Lipid Profile: No results for input(s): "CHOL", "HDL", "LDLCALC", "TRIG", "CHOLHDL", "LDLDIRECT" in the last 72 hours. Thyroid Function Tests: Recent Labs    06/06/23 0153  TSH 2.211   Anemia Panel: Recent Labs    06/05/23 0305  VITAMINB12 435   Sepsis Labs: Recent Labs  Lab 06/04/23 0226 06/05/23 0305  PROCALCITON 0.49 0.33    Recent Results (from the past 240 hour(s))  Resp panel by RT-PCR (RSV, Flu A&B, Covid) Anterior Nasal Swab     Status: None   Collection Time: 06/02/23  7:55 PM   Specimen: Anterior Nasal Swab  Result Value Ref Range Status   SARS Coronavirus 2 by RT PCR NEGATIVE NEGATIVE Final   Influenza A by PCR NEGATIVE NEGATIVE Final   Influenza B by PCR NEGATIVE NEGATIVE Final    Comment: (NOTE) The Xpert Xpress SARS-CoV-2/FLU/RSV plus assay is intended as an aid in the diagnosis of influenza from Nasopharyngeal swab specimens and should not be used as a sole basis for treatment. Nasal washings and aspirates are unacceptable for Xpert Xpress SARS-CoV-2/FLU/RSV testing.  Fact Sheet for Patients: BloggerCourse.com  Fact Sheet for Healthcare  Providers: SeriousBroker.it  This test is not yet approved or cleared by the [place] FDA and has been authorized for detection and/or diagnosis of SARS-CoV-2 by FDA under an Emergency Use Authorization (EUA). This EUA will remain in effect (meaning this test can be used) for the duration of the COVID-19 declaration under Section 564(b)(1) of the Act, 21 U.S.C. section 360bbb-3(b)(1), unless the authorization is terminated or revoked.     Resp Syncytial Virus by PCR NEGATIVE NEGATIVE Final    Comment: (NOTE) Fact Sheet for Patients: BloggerCourse.com  Fact Sheet for Healthcare Providers: SeriousBroker.it  This test is not yet approved or cleared by the [place] FDA and has been authorized for detection and/or diagnosis of SARS-CoV-2 by FDA under an Emergency Use Authorization (EUA). This EUA will remain  in effect (meaning this test can be used) for the duration of the COVID-19 declaration under Section 564(b)(1) of the Act, 21 U.S.C. section 360bbb-3(b)(1), unless the authorization is terminated or revoked.  Performed at [facility], [address] [place], [place] [address]   Gastrointestinal Panel by PCR , Stool     Status: Abnormal   Collection Time: 06/03/23  6:27 AM   Specimen: Urine, Clean Catch; Stool  Result Value Ref Range Status   Campylobacter species DETECTED (A) NOT DETECTED Final    Comment: RESULT CALLED TO, READ BACK BY AND VERIFIED WITH: EMMANUEL CASTRO 06/03/23 2056 MU    Plesimonas shigelloides NOT DETECTED NOT DETECTED Final   Salmonella species NOT DETECTED NOT DETECTED Final   Yersinia enterocolitica NOT DETECTED NOT DETECTED Final   Vibrio species NOT DETECTED NOT DETECTED Final   Vibrio cholerae NOT DETECTED NOT DETECTED Final   Enteroaggregative E coli (EAEC) NOT DETECTED NOT DETECTED Final   Enteropathogenic E coli (EPEC) NOT DETECTED NOT DETECTED Final    Enterotoxigenic E coli (ETEC) NOT DETECTED NOT DETECTED Final   Shiga like toxin producing E coli (STEC) NOT DETECTED NOT DETECTED Final   Shigella/Enteroinvasive E coli (EIEC) NOT DETECTED NOT DETECTED Final   Cryptosporidium NOT DETECTED NOT DETECTED Final   Cyclospora cayetanensis NOT DETECTED NOT DETECTED Final   Entamoeba histolytica NOT DETECTED NOT DETECTED Final   Giardia lamblia NOT DETECTED NOT DETECTED Final   Adenovirus F40/41 NOT DETECTED NOT DETECTED Final   Astrovirus NOT DETECTED NOT DETECTED Final   Norovirus GI/GII NOT DETECTED NOT DETECTED Final   Rotavirus A NOT DETECTED NOT DETECTED Final   Sapovirus (I, II, IV, and V) NOT DETECTED NOT DETECTED Final    Comment: Performed at [facility], [address] [place], [place] [address]  C Difficile Quick Screen w PCR reflex     Status: None   Collection Time: 06/03/23 11:01 AM   Specimen: STOOL  Result Value Ref Range Status   C Diff antigen NEGATIVE NEGATIVE Final   C Diff toxin NEGATIVE NEGATIVE Final   C Diff interpretation No C. difficile detected.  Final    Comment: Performed at [facility], [address] [place], [place] [address]         Radiology Studies: CT HEAD WO CONTRAST ( )  Result Date: 06/04/2023 CLINICAL DATA:  Mental status change, unknown cause. EXAM: CT HEAD WITHOUT CONTRAST TECHNIQUE: Contiguous axial images were obtained from the base of the skull through the vertex without intravenous contrast. RADIATION DOSE REDUCTION: This exam was performed according to the departmental dose-optimization program which includes automated exposure control, adjustment of the mA and/or kV according to patient size and/or use of iterative reconstruction technique. COMPARISON:  None Available. FINDINGS: Brain: No acute infarct, hemorrhage, or mass lesion is present. The ventricles are of normal size. Deep brain nuclei are within normal limits. No significant extraaxial fluid collection is present.  The brainstem and cerebellum are within normal limits. Midline structures are within normal limits. Vascular: No hyperdense vessel or unexpected calcification. Skull: Calvarium is intact. No focal lytic or blastic lesions are present. No significant extracranial soft tissue lesion is present. Sinuses/Orbits: The paranasal sinuses and mastoid air cells are clear. IMPRESSION: Negative CT of the head. Electronically Signed   By: Marin Roberts M.D.   On: 06/04/2023 18:32   ECHOCARDIOGRAM COMPLETE  Result Date: 06/04/2023    ECHOCARDIOGRAM REPORT   Patient Name:   VRISHANK NEPOMUCENO Date  of Exam: 06/04/2023 Medical Rec #:  [id number]        Height:       66.0 in Accession #:    [id number]       Weight:       275.0 lb Date of Birth:  1945/11/12        BSA:          2.290 m Patient Age:    46 years         BP:           96/68 mmHg Patient Gender: M                HR:           94 bpm. Exam Location:  Inpatient Procedure: 2D Echo, Cardiac Doppler, Color Doppler and Intracardiac            Opacification Agent Indications:     CHF-Acute Systolic I50.21  History:         Patient has prior history of Echocardiogram examinations, most                  recent 01/30/2022. CHF, Arrythmias:RBBB,                  Signs/Symptoms:Shortness of Breath; Risk Factors:Hypertension                  and Sleep Apnea.  Sonographer:     Lucendia Herrlich Referring Phys:  [id number] Azlaan Isidore Juanetta Gosling Diagnosing Phys: Freida Busman McleanMD IMPRESSIONS  1. Left ventricular ejection fraction, by estimation, is 65 to 70%. The left ventricle has hyperdynamic function. The left ventricle has no regional wall motion abnormalities. Left ventricular diastolic parameters are consistent with Grade I diastolic dysfunction (impaired relaxation).  2. Mildly D-shaped interventricular septum suggests a degree of RV pressure/volume overload. Right ventricular systolic function is normal. The right ventricular size is normal. Tricuspid regurgitation signal is inadequate for  assessing PA pressure.  3. The mitral valve is normal in structure. No evidence of mitral valve regurgitation. No evidence of mitral stenosis.  4. The aortic valve is tricuspid. There is mild calcification of the aortic valve. Aortic valve regurgitation is not visualized. No aortic stenosis is present.  5. Aortic dilatation noted. 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  6. The inferior vena cava is dilated in size with >50% respiratory variability, suggesting right atrial pressure of 8 mmHg. FINDINGS  Left Ventricle: Left ventricular ejection fraction, by estimation, is 65 to 70%. The left ventricle has hyperdynamic function. The left ventricle has no regional wall motion abnormalities. Definity contrast agent was given IV to delineate the left ventricular endocardial borders. The left ventricular internal cavity size was normal in size. There is no left ventricular hypertrophy. Left ventricular diastolic parameters are consistent with Grade I diastolic dysfunction (impaired relaxation). Right Ventricle: Mildly D-shaped interventricular septum suggests a degree of RV pressure/volume overload. The right ventricular size is normal. No increase in right ventricular wall thickness. Right ventricular systolic function is normal. Tricuspid regurgitation signal is inadequate for assessing PA pressure. Left Atrium: Left atrial size was normal in size. Right Atrium: Right atrial size was normal in size. Pericardium: There is no evidence of pericardial effusion. Mitral Valve: The mitral valve is normal in structure. Mild mitral annular calcification. No evidence of mitral valve regurgitation. No evidence of mitral valve stenosis. Tricuspid Valve: The tricuspid valve  is normal in structure. Tricuspid valve regurgitation is not demonstrated. Aortic Valve: The aortic valve is tricuspid. There is mild calcification of the aortic valve. Aortic  valve regurgitation is not visualized. No aortic stenosis is present. Aortic valve peak gradient measures 11.3 mmHg. Pulmonic Valve: The pulmonic valve was not well visualized. Pulmonic valve regurgitation is not visualized. Aorta: Aortic dilatation noted. 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 Venous: The inferior vena cava is dilated in size with greater than 50% respiratory variability, suggesting right atrial pressure of 8 mmHg. IAS/Shunts: No atrial level shunt detected by color flow Doppler.  LEFT VENTRICLE PLAX 2D LVIDd:         3.30 cm   Diastology LVIDs:         2.20 cm   LV e' medial:    8.55 cm/s LV PW:         1.30 cm   LV E/e' medial:  8.6 LV IVS:        1.70 cm   LV e' lateral:   9.17 cm/s LVOT diam:     2.30 cm   LV E/e' lateral: 8.0 LV SV:         85 LV SV Index:   37 LVOT Area:     4.15 cm  RIGHT VENTRICLE             IVC RV S prime:     16.50 cm/s  IVC diam: 2.10 cm LEFT ATRIUM           Index        RIGHT ATRIUM           Index LA diam:      4.50 cm 1.97 cm/m   RA Area:     20.60 cm LA Vol (A2C): 46.9 ml 20.48 ml/m  RA Volume:   56.90 ml  24.85 ml/m LA Vol (A4C): 49.2 ml 21.49 ml/m  AORTIC VALVE                 PULMONIC VALVE AV Area (Vmax): 3.19 cm     PR End Diast Vel: 10.50 msec AV Vmax:        168.00 cm/s AV Peak Grad:   11.3 mmHg LVOT Vmax:      129.00 cm/s LVOT Vmean:     82.433 cm/s LVOT VTI:       0.205 m  AORTA Ao Root diam: 4.00 cm Ao Asc diam:  4.10 cm MITRAL VALVE MV Area (PHT): 4.21 cm     SHUNTS MV Decel Time: 180 msec     Systemic VTI:  0.20 m MV E velocity: 73.60 cm/s   Systemic Diam: 2.30 cm MV A velocity: 115.00 cm/s MV E/A ratio:  0.64 Dalton McleanMD Electronically signed by Wilfred Lacy Signature Date/Time: 06/04/2023/4:48:39 PM    Final (Updated)         Scheduled Meds:  acidophilus  2 capsule Oral Daily   vitamin B-12  1,000 mcg Oral Daily   enoxaparin (LOVENOX)  injection  40 mg Subcutaneous Q24H   fluticasone furoate-vilanterol  1 puff Inhalation Daily   And   umeclidinium bromide  1 puff Inhalation Daily   midodrine  5 mg Oral TID WC   Continuous Infusions:   LOS: 3 days    Time spent: 45 minutes spent on chart review, discussion with nursing staff, consultants, updating family and interview/physical exam; more than 50%  of that time was spent in counseling and/or coordination of care.    Joseph Art, DO [facility] Available via Epic secure chat 7am-7pm After these hours, please refer to coverage provider listed on [email or web address] 06/06/2023, 1:07 PM

## 2023-06-07 DIAGNOSIS — R197 Diarrhea, unspecified: Secondary | ICD-10-CM | POA: Diagnosis not present

## 2023-06-07 DIAGNOSIS — R112 Nausea with vomiting, unspecified: Secondary | ICD-10-CM | POA: Diagnosis not present

## 2023-06-07 LAB — CBC
HCT: 46.6 % (ref 39.0–52.0)
Hemoglobin: 15.7 g/dL (ref 13.0–17.0)
MCH: 29.6 pg (ref 26.0–34.0)
MCHC: 33.7 g/dL (ref 30.0–36.0)
MCV: 87.9 fL (ref 80.0–100.0)
Platelets: 114 10*3/uL — ABNORMAL LOW (ref 150–400)
RBC: 5.3 MIL/uL (ref 4.22–5.81)
RDW: 14.5 % (ref 11.5–15.5)
WBC: 8.7 10*3/uL (ref 4.0–10.5)
nRBC: 0 % (ref 0.0–0.2)

## 2023-06-07 LAB — BASIC METABOLIC PANEL WITH GFR
Anion gap: 14 (ref 5–15)
BUN: 21 mg/dL (ref 8–23)
CO2: 23 mmol/L (ref 22–32)
Calcium: 8.6 mg/dL — ABNORMAL LOW (ref 8.9–10.3)
Chloride: 106 mmol/L (ref 98–111)
Creatinine, Ser: 1.09 mg/dL (ref 0.61–1.24)
GFR, Estimated: >60 mL/min (ref >60)
Glucose, Bld: 82 mg/dL (ref 70–99)
Potassium: 3.6 mmol/L (ref 3.5–5.1)
Sodium: 143 mmol/L (ref 135–145)

## 2023-06-07 LAB — CK: Total CK: 1866 U/L — ABNORMAL HIGH (ref 49–397)

## 2023-06-07 MED ORDER — METOPROLOL TARTRATE 12.5 MG HALF TABLET
12.5000 mg | ORAL_TABLET | Freq: Two times a day (BID) | ORAL | Status: DC
  Administered 2023-06-07 – 2023-06-08 (×3): 12.5 mg via ORAL
  Filled 2023-06-07 (×3): qty 1

## 2023-06-07 NOTE — Plan of Care (Signed)
  Problem: Clinical Measurements: Goal: Ability to maintain clinical measurements within normal limits will improve Outcome: Completed/Met Goal: Cardiovascular complication will be avoided Outcome: Completed/Met

## 2023-06-07 NOTE — Progress Notes (Signed)
PROGRESS NOTE    Justin Padilla  ZOX:[id number] DOB: Mar 19, 1946 DOA: 06/02/2023 PCP: Pincus Sanes, MD    Brief Narrative:  Justin Padilla is a 77 y.o. male with medical history significant of peripheral edema on lasix, OSA, morbid obesity, HTN. Pt in to ED with c/o diarrhea.  volume overloaded (family says has been having swelling for a while).   IV lasix, nutrition consulted for education.     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 CT head WNL -B12 low 400s/ammonia slightly elevated -would encourage compliance with CPAP -outpatient follow up with neurology  Thrombocytopenia (HCC) -trend, stable  Polycythemia H/o Polycythemia previously, though HGB in normal ranges during more recent months. HGB of 18.5 today which is elevated compared to baseline. -suspect related to CPAP noncompliance  Essential hypertension Midodrine/IV lasix -hold BB for now  Obesity Estimated body mass index is 43.45 kg/m as calculated from the following:   Height as of this encounter: 5\' 6"  (1.676 m).   Weight as of this encounter: 122.1 kg.   Hypokalemia -replete  OSA -not compliant with CPAP per family  Appears to be nearing d/c and optimal hospitalization management: -will  need neurology follow up-- referral placed -Outpatient podiatry referral for plantar fascitis/toenail care- referral placed -close PCP and cardiology follow up -needs increased activity -compliance with CPAP and new sleep study -low salt diet (family says eats salty snacks all day)   DVT prophylaxis: Place TED hose Start: 06/06/23 1510 enoxaparin (LOVENOX) injection 40 mg Start: 06/03/23 1000    Code Status: Full Code Family Communication: called niece   Disposition Plan:  Level of care: Med-Surg Status is: Inpatient Remains inpatient appropriate     Consultants:  cards  Subjective: Feeling well, no SOB  Objective: Vitals:   06/06/23 2040 06/07/23 0525 06/07/23 0848 06/07/23 0855  BP: (!) 135/94 128/75 (!) 150/86   Pulse: 84  (!) 105   Resp: 16 16 18    Temp: (!) 97.5 F (36.4 C) (!) 97.5 F (36.4 C) 98.3 F (36.8 C)   TempSrc: Oral Oral    SpO2: 94%  (!) 83% 95%  Weight:      Height:        Intake/Output Summary (Last 24 hours) at 06/07/2023 1529 Last data filed at 06/07/2023 0830 Gross per 24 hour  Intake 240 ml  Output 0 ml  Net 240 ml   Filed Weights   06/02/23 1951 06/05/23 1116 06/06/23 1050  Weight: 124.7 kg 122.2 kg 122.1 kg    Examination:   General: Appearance:    Severely obese male in no acute distress     Lungs:    diminished, respirations unlabored  Heart:    Tachycardic.  Improved swelling   MS:   All extremities are intact.    Neurologic:   Awake, alert, oriented  x 3       Data Reviewed: I have personally reviewed following labs and imaging studies  CBC: Recent Labs  Lab 06/02/23 2000 06/03/23 0836 06/04/23 0226 06/05/23 0305 06/07/23 0046  WBC 13.5* 13.6* 11.1* 7.4 8.7  NEUTROABS 11.4*  --   --   --   --   HGB 18.5* 17.2* 17.1* 16.6 15.7  HCT 53.8* 50.6 50.9 50.0 46.6  MCV 89.5 87.5 87.3 89.1 87.9  PLT 85* 113*  112* 102* 98* 114*   Basic Metabolic Panel: Recent Labs  Lab 06/02/23 2000 06/03/23 0836  06/04/23 0226 06/05/23 0305 06/06/23 0153 06/07/23 0046  NA 140 139 136 140 138 143  K 3.3* 2.9* 3.8 3.1* 3.2* 3.6  CL 109 110 107 109 106 106  CO2 19* 20* 19* 24 23 23   GLUCOSE 108* 127* 92 83 93 82  BUN 17 17 16 16 17 21   CREATININE 1.49* 1.46* 1.36* 1.28* 1.21 1.09  CALCIUM 8.4* 8.2* 8.2* 8.2* 8.1* 8.6*  MG 1.5* 1.8  --   --   --   --    GFR: Estimated Creatinine Clearance: 68.8 mL/min (by C-G formula based on SCr of 1.09 mg/dL). Liver Function Tests: Recent Labs  Lab 06/02/23 2000 06/03/23 0836  AST 44* 59*  ALT 16 25  ALKPHOS 53 48  BILITOT 1.0 0.4  PROT 6.2* 6.0*  ALBUMIN 3.4* 3.2*   Recent Labs  Lab 06/02/23 2000  LIPASE 26   Recent Labs  Lab 06/05/23 0305  AMMONIA 38*   Coagulation Profile: Recent Labs  Lab 06/03/23 0836  INR 1.2   Cardiac Enzymes: Recent Labs  Lab 06/02/23 2000 06/05/23 0305 06/06/23 0153 06/07/23 0046  CKTOTAL 1,165* 2,529* 2,866* 1,866*   BNP (last 3 results) No results for input(s): "PROBNP" in the last 8760 hours. HbA1C: No results for input(s): "HGBA1C" in the last 72 hours. CBG: No results for input(s): "GLUCAP" in the last 168 hours. Lipid Profile: No results for input(s): "CHOL", "HDL", "LDLCALC", "TRIG", "CHOLHDL", "LDLDIRECT" in the last 72 hours. Thyroid Function Tests: Recent Labs    06/06/23 0153  TSH 2.211   Anemia Panel: Recent Labs    06/05/23 0305  VITAMINB12 435   Sepsis Labs: Recent Labs  Lab 06/04/23 0226 06/05/23 0305  PROCALCITON 0.49 0.33    Recent Results (from the past 240 hour(s))  Resp panel by RT-PCR (RSV, Flu A&B, Covid) Anterior Nasal Swab     Status: None   Collection Time: 06/02/23  7:55 PM   Specimen: Anterior Nasal Swab  Result Value Ref Range Status   SARS Coronavirus 2 by RT PCR NEGATIVE NEGATIVE Final   Influenza A by PCR NEGATIVE NEGATIVE Final   Influenza B by PCR NEGATIVE NEGATIVE Final    Comment: (NOTE) The Xpert Xpress SARS-CoV-2/FLU/RSV plus assay is  intended as an aid in the diagnosis of influenza from Nasopharyngeal swab specimens and should not be used as a sole basis for treatment. Nasal washings and aspirates are unacceptable for Xpert Xpress SARS-CoV-2/FLU/RSV testing.  Fact Sheet for Patients: BloggerCourse.com  Fact Sheet for Healthcare Providers: SeriousBroker.it  This test is not yet approved or cleared by the [place] FDA and has been authorized for detection and/or diagnosis of SARS-CoV-2 by FDA under an Emergency Use Authorization (EUA). This EUA will remain in effect (meaning this test can be used) for the duration of the COVID-19 declaration under Section 564(b)(1) of the Act, 21 U.S.C. section 360bbb-3(b)(1), unless the authorization is terminated  or revoked.     Resp Syncytial Virus by PCR NEGATIVE NEGATIVE Final    Comment: (NOTE) Fact Sheet for Patients: BloggerCourse.com  Fact Sheet for Healthcare Providers: SeriousBroker.it  This test is not yet approved or cleared by the [place] FDA and has been authorized for detection and/or diagnosis of SARS-CoV-2 by FDA under an Emergency Use Authorization (EUA). This EUA will remain in effect (meaning this test can be used) for the duration of the COVID-19 declaration under Section 564(b)(1) of the Act, 21 U.S.C. section 360bbb-3(b)(1), unless the authorization is terminated or revoked.  Performed at [facility], [address], [place], [place] [address]   Gastrointestinal Panel by PCR , Stool     Status: Abnormal   Collection Time: 06/03/23  6:27 AM   Specimen: Urine, Clean Catch; Stool  Result Value Ref Range Status   Campylobacter species DETECTED (A) NOT DETECTED Final    Comment: RESULT CALLED TO, READ BACK BY AND VERIFIED WITH: EMMANUEL CASTRO 06/03/23 2056 MU    Plesimonas shigelloides NOT DETECTED NOT DETECTED Final   Salmonella  species NOT DETECTED NOT DETECTED Final   Yersinia enterocolitica NOT DETECTED NOT DETECTED Final   Vibrio species NOT DETECTED NOT DETECTED Final   Vibrio cholerae NOT DETECTED NOT DETECTED Final   Enteroaggregative E coli (EAEC) NOT DETECTED NOT DETECTED Final   Enteropathogenic E coli (EPEC) NOT DETECTED NOT DETECTED Final   Enterotoxigenic E coli (ETEC) NOT DETECTED NOT DETECTED Final   Shiga like toxin producing E coli (STEC) NOT DETECTED NOT DETECTED Final   Shigella/Enteroinvasive E coli (EIEC) NOT DETECTED NOT DETECTED Final   Cryptosporidium NOT DETECTED NOT DETECTED Final   Cyclospora cayetanensis NOT DETECTED NOT DETECTED Final   Entamoeba histolytica NOT DETECTED NOT DETECTED Final   Giardia lamblia NOT DETECTED NOT DETECTED Final   Adenovirus F40/41 NOT DETECTED NOT DETECTED Final   Astrovirus NOT DETECTED NOT DETECTED Final   Norovirus GI/GII NOT DETECTED NOT DETECTED Final   Rotavirus A NOT DETECTED NOT DETECTED Final   Sapovirus (I, II, IV, and V) NOT DETECTED NOT DETECTED Final    Comment: Performed at [facility], [address] [place], [place] [address]  C Difficile Quick Screen w PCR reflex     Status: None   Collection Time: 06/03/23 11:01 AM   Specimen: STOOL  Result Value Ref Range Status   C Diff antigen NEGATIVE NEGATIVE Final   C Diff toxin NEGATIVE NEGATIVE Final   C Diff interpretation No C. difficile detected.  Final    Comment: Performed at [facility], [address] [place], [place] [address]         Radiology Studies: No results found.      Scheduled Meds:  acidophilus  2 capsule Oral Daily   enoxaparin (LOVENOX) injection  40 mg Subcutaneous Q24H   fluticasone furoate-vilanterol  1 puff Inhalation Daily   And   umeclidinium bromide  1 puff Inhalation Daily   metoprolol tartrate  12.5 mg Oral BID   Continuous Infusions:   LOS: 4 days    Time spent: 45 minutes spent on chart review, discussion with nursing  staff, consultants, updating family and interview/physical exam; more than 50% of that time was spent in counseling and/or coordination of care.    Joseph Art, DO [facility] Available via Epic secure chat 7am-7pm After these hours, please refer to coverage provider listed on [email or web address] 06/07/2023, 3:29 PM

## 2023-06-07 NOTE — Progress Notes (Signed)
Nutrition Quick Note:   MD consult for Low Sodium Diet education. RD attempted to call pt's room but no answer. RD will attach diet education handout to discharge paperwork. RD will attempt diet education face to face at follow up.   PO intakes 50-100% since admission. No skin breakdown noted.   Bethann Humble, RD, LDN, CNSC.

## 2023-06-07 NOTE — Discharge Instructions (Signed)
Low Sodium Nutrition Therapy  Eating less sodium can help you if you have high blood pressure, heart failure, or kidney or liver disease.   Your body needs a little sodium, but too much sodium can cause your body to hold onto extra water. This extra water will raise your blood pressure and can cause damage to your heart, kidneys, or liver as they are forced to work harder.   Sometimes you can see how the extra fluid affects you because your hands, legs, or belly swell. You may also hold water around your heart and lungs, which makes it hard to breathe.   Even if you take medication for blood pressure or a water pill (diuretic) to remove fluid, it is still important to have less salt in your diet.   Check with your primary care provider before drinking alcohol since it may affect the amount of fluid in your body and how your heart, kidneys, or liver work. Sodium in Food A low-sodium meal plan limits the sodium that you get from food and beverages to 1,500-2,000 milligrams (mg) per day. Salt is the main source of sodium. Read the nutrition label on the package to find out how much sodium is in one serving of a food.  Select foods with 140 milligrams (mg) of sodium or less per serving.  You may be able to eat one or two servings of foods with a little more than 140 milligrams (mg) of sodium if you are closely watching how much sodium you eat in a day.  Check the serving size on the label. The amount of sodium listed on the label shows the amount in one serving of the food. So, if you eat more than one serving, you will get more sodium than the amount listed.  Tips Cutting Back on Sodium Eat more fresh foods.  Fresh fruits and vegetables are low in sodium, as well as frozen vegetables and fruits that have no added juices or sauces.  Fresh meats are lower in sodium than processed meats, such as bacon, sausage, and hotdogs.  Not all processed foods are unhealthy, but some processed foods may have too  much sodium.  Eat less salt at the table and when cooking. One of the ingredients in salt is sodium.  One teaspoon of table salt has 2,300 milligrams of sodium.  Leave the salt out of recipes for pasta, casseroles, and soups. Be a Engineer, building services.  Food packages that say "Salt-free", sodium-free", "very low sodium," and "low sodium" have less than 140 milligrams of sodium per serving.  Beware of products identified as "Unsalted," "No Salt Added," "Reduced Sodium," or "Lower Sodium." These items may still be high in sodium. You should always check the nutrition label. Add flavors to your food without adding sodium.  Try lemon juice, lime juice, or vinegar.  Dry or fresh herbs add flavor.  Buy a sodium-free seasoning blend or make your own at home. You can purchase salt-free or sodium-free condiments like barbeque sauce in stores and online. Ask your registered dietitian nutritionist for recommendations and where to find them.   Eating in Restaurants Choose foods carefully when you eat outside your home. Restaurant foods can be very high in sodium. Many restaurants provide nutrition facts on their menus or their websites. If you cannot find that information, ask your server. Let your server know that you want your food to be cooked without salt and that you would like your salad dressing and sauces to be served on the  side.    Foods Recommended Food Group Foods Recommended  Grains Bread, bagels, rolls without salted tops Homemade bread made with reduced-sodium baking powder Cold cereals, especially shredded wheat and puffed rice Oats, grits, or cream of wheat Pastas, quinoa, and rice Popcorn, pretzels or crackers without salt Corn tortillas  Protein Foods Fresh meats and fish; [place] bacon (check the nutrition labels - make sure they are not packaged in a sodium solution) Canned or packed tuna (no more than 4 ounces at 1 serving) Beans and peas Soybeans) and tofu Eggs Nuts or nut butters  without salt  Dairy Milk or milk powder Plant milks, such as rice and soy Yogurt, including Greek yogurt Small amounts of natural cheese (blocks of cheese) or reduced-sodium cheese can be used in moderation. (Swiss, ricotta, and fresh mozzarella cheese are lower in sodium than the others) Cream Cheese Low sodium cottage cheese  Vegetables Fresh and frozen vegetables without added sauces or salt Homemade soups (without salt) Low-sodium, salt-free or sodium-free canned vegetables and soups  Fruit Fresh and canned fruits Dried fruits, such as raisins, cranberries, and prunes  Oils Tub or liquid margarine, regular or without salt Canola, corn, peanut, olive, safflower, or sunflower oils  Condiments Fresh or dried herbs such as basil, bay leaf, dill, mustard (dry), nutmeg, paprika, parsley, rosemary, sage, or thyme.  Low sodium ketchup Vinegar  Lemon or lime juice Pepper, red pepper flakes, and cayenne. Hot sauce contains sodium, but if you use just a drop or two, it will not add up to much.  Salt-free or sodium-free seasoning mixes and marinades Simple salad dressings: vinegar and oil   Foods Not Recommended Food Group Foods Not Recommended  Grains Breads or crackers topped with salt Cereals (hot/cold) with more than 300 mg sodium per serving Biscuits, cornbread, and other "quick" breads prepared with baking soda Pre-packaged bread crumbs Seasoned and packaged rice and pasta mixes Self-rising flours  Protein Foods Cured meats: Bacon, ham, sausage, pepperoni and hot dogs Canned meats (chili, vienna sausage, or sardines) Smoked fish and meats Frozen meals that have more than 600 mg of sodium per serving Egg substitute (with added sodium)  Dairy Buttermilk Processed cheese spreads Cottage cheese (1 cup may have over 500 mg of sodium; look for low-sodium.) American or feta cheese Shredded Cheese has more sodium than blocks of cheese String cheese  Vegetables Canned vegetables  (unless they are salt-free, sodium-free or low sodium) Frozen vegetables with seasoning and sauces Sauerkraut and pickled vegetables Canned or dried soups (unless they are salt-free, sodium-free, or low sodium) [place] fries and onion rings  Fruit Dried fruits preserved with additives that have sodium  Oils Salted butter or margarine, all types of olives  Condiments Salt, sea salt, kosher salt, onion salt, and garlic salt Seasoning mixes with salt Bouillon cubes Ketchup Barbeque sauce and Worcestershire sauce unless low sodium Soy sauce Salsa, pickles, olives, relish Salad dressings: ranch, blue cheese, [place], and [place].   Low Sodium Sample 1-Day Menu  Breakfast 1 cup cooked oatmeal  1 slice whole wheat bread toast  1 tablespoon peanut butter without salt  1 banana  1 cup 1% milk  Lunch Tacos made with: 2 corn tortillas   cup black beans, low sodium   cup roasted or grilled chicken (without skin)   avocado  Squeeze of lime juice  1 cup salad greens  1 tablespoon low-sodium salad dressing   cup strawberries  1 orange  Afternoon Snack 1/3 cup grapes  6 ounces yogurt  Evening Meal 3 ounces herb-baked fish  1 baked potato  2 teaspoons olive oil   cup cooked carrots  2 thick slices tomatoes on:  2 lettuce leaves  1 teaspoon olive oil  1 teaspoon balsamic vinegar  1 cup 1% milk  Evening Snack 1 apple   cup almonds without salt   Low-Sodium Vegetarian (Lacto-Ovo) Sample 1-Day Menu  Breakfast 1 cup cooked oatmeal  1 slice whole wheat toast  1 tablespoon peanut butter without salt  1 banana  1 cup 1% milk  Lunch Tacos made with: 2 corn tortillas   cup black beans, low sodium   cup roasted or grilled chicken (without skin)   avocado  Squeeze of lime juice  1 cup salad greens  1 tablespoon low-sodium salad dressing   cup strawberries  1 orange  Evening Meal Stir fry made with:  cup tofu  1 cup brown rice   cup broccoli   cup green beans   cup  peppers   tablespoon peanut oil  1 orange  1 cup 1% milk  Evening Snack 4 strips celery  2 tablespoons hummus  1 hard-boiled egg   Low-Sodium Vegan Sample 1-Day Menu  Breakfast 1 cup cooked oatmeal  1 tablespoon peanut butter without salt  1 cup blueberries  1 cup soymilk fortified with calcium, vitamin B12, and vitamin D  Lunch 1 small whole wheat pita   cup cooked lentils  2 tablespoons hummus  4 carrot sticks  1 medium apple  1 cup soymilk fortified with calcium, vitamin B12, and vitamin D  Evening Meal Stir fry made with:  cup tofu  1 cup brown rice   cup broccoli   cup green beans   cup peppers   tablespoon peanut oil  1 cup cantaloupe  Evening Snack 1 cup soy yogurt   cup mixed nuts  Copyright 2020  Academy [employer]. All rights reserved  Sodium Free Flavoring Tips  When cooking, the following items may be used for flavoring instead of salt or seasonings that contain sodium. Remember: A little bit of spice goes a long way! Be careful not to overseason. Spice Blend Recipe (makes about ? cup) 5 teaspoons onion powder  2 teaspoons garlic powder  2 teaspoons paprika  2 teaspoon dry mustard  1 teaspoon crushed thyme leaves   teaspoon white pepper   teaspoon celery seed Food Item Flavorings  Beef Basil, bay leaf, caraway, curry, dill, dry mustard, garlic, grape jelly, green pepper, mace, marjoram, mushrooms (fresh), nutmeg, onion or onion powder, parsley, pepper, rosemary, sage  Chicken Basil, cloves, cranberries, mace, mushrooms (fresh), nutmeg, oregano, paprika, parsley, pineapple, saffron, sage, savory, tarragon, thyme, tomato, turmeric  Egg Chervil, curry, dill, dry mustard, garlic or garlic powder, green pepper, jelly, mushrooms (fresh), nutmeg, onion powder, paprika, parsley, rosemary, tarragon, tomato  Fish Basil, bay leaf, chervil, curry, dill, dry mustard, green pepper, lemon juice, marjoram, mushrooms (fresh), paprika, pepper,  tarragon, tomato, turmeric  Lamb Cloves, curry, dill, garlic or garlic powder, mace, mint, mint jelly, onion, oregano, parsley, pineapple, rosemary, tarragon, thyme  Pork Applesauce, basil, caraway, chives, cloves, garlic or garlic powder, onion or onion powder, rosemary, thyme  Veal Apricots, basil, bay leaf, currant jelly, curry, ginger, marjoram, mushrooms (fresh), oregano, paprika  Vegetables Basil, dill, garlic or garlic powder, ginger, lemon juice, mace, marjoram, nutmeg, onion or onion powder, tarragon, tomato, sugar or sugar substitute, salt-free salad dressing, vinegar  Desserts Allspice, anise, cinnamon, cloves, ginger, mace, nutmeg, vanilla extract, other  extracts   Copyright 2020  Academy of [employer]etics. All rights reserved  Fluid Restricted Nutrition Therapy  You have been prescribed this diet because your condition affects how much fluid you can eat or drink. If your heart, liver, or kidneys aren't working properly, you may not be able to effectively eliminate fluids from the body and this may cause swelling (edema) in the legs, arms, and/or stomach. Drink no more than _________ liters or ________ ounces or ________cups of fluid per day.  You don't need to stop eating or drinking the same fluids you normally would, but you may need to eat or drink less than usual.  Your registered dietitian nutritionist will help you determine the correct amount of fluid to consume during the day Breakfast Include fluids taken with medications  Lunch Include fluids taken with medications  Dinner Include fluids taken with medications  Bedtime Snack Include fluids taken with medications     Tips What Are Fluids?  A fluid is anything that is liquid or anything that would melt if left at room temperature. You will need to count these foods and liquids--including any liquid used to take medication--as part of your daily fluid intake. Some examples are: Alcohol (drink only with your  doctor's permission)  Coffee, tea, and other hot beverages  Gelatin (Jell-O)  Gravy  Ice cream, sherbet, sorbet  Ice cubes, ice chips  Milk, liquid creamer  Nutritional supplements  Popsicles  Vegetable and fruit juices; fluid in canned fruit  Watermelon  Yogurt  Soft drinks, lemonade, limeade  Soups  Syrup How Do I Measure My Fluid Intake? Record your fluid intake daily.  Tip: Every day, each time you eat or drink fluids, pour water in the same amount into an empty container that can hold the same amount of fluids you are allowed daily. This may help you keep track of how much fluid you are taking in throughout the day.  To accurately keep track of how much liquid you take in, measure the size of the cups, glasses, and bowls you use. If you eat soup, measure how much of it is liquid and how much is solid (such as noodles, vegetables, meat). Conversions for Measuring Fluid Intake  Milliliters (mL) Liters (L) Ounces (oz) Cups (c)  1000 1 32 4  1200 1.2 40 5  1500 1.5 50 6 1/4  1800 1.8 60 7 1/2  2000 2 67 8 1/3  Tips to Reduce Your Thirst Chew gum or suck on hard candy.  Rinse or gargle with mouthwash. Do not swallow.  Ice chips or popsicles my help quench thirst, but this too needs to be calculated into the total restriction. Melt ice chips or cubes first to figure out how much fluid they produce (for example, experiment with melting  cup ice chips or 2 ice cubes).  Add a lemon wedge to your water.  Limit how much salt you take in. A high salt intake might make you thirstier.  Don't eat or drink all your allowed liquids at once. Space your liquids out through the day.  Use small glasses and cups and sip slowly. If allowed, take your medications with fluids you eat or drink during a meal.   Fluid-Restricted Nutrition Therapy Sample 1-Day Menu  Breakfast 1 slice wheat toast  1 tablespoon peanut butter  1/2 cup yogurt (120 milliliters)  1/2 cup blueberries  1 cup milk (240  milliliters)   Lunch 3 ounces sliced [place]  2 slices whole wheat  bread  1/2 cup lettuce for sandwich  2 slices tomato for sandwich  1 ounce reduced-fat, reduced-sodium cheese  1/2 cup fresh carrot sticks  1 banana  1 cup unsweetened tea (240 milliliters)   Evening Meal 8 ounces soup (240 milliliters)  3 ounces salmon  1/2 cup quinoa  1 cup green beans  1 cup mixed greens salad  1 tablespoon olive oil  1 cup coffee (240 milliliters)  Evening Snack 1/2 cup sliced peaches  1/2 cup frozen yogurt (120 milliliters)  1 cup water (240 milliliters)  Copyright 2020  Academy of [employer] Dietetics. All rights reserved    Heart-Healthy Nutrition Therapy  A heart-healthy diet is recommended to reduce your unhealthy blood cholesterol levels, manage high blood pressure, and lower your risk for heart disease. To follow a heart-healthy diet, Eat a balanced diet with whole grains, fruits and vegetables, and lean protein sources. Achieve and maintain a healthy weight. Choose heart-healthy unsaturated fats. Limit saturated fats, trans fats, and cholesterol intake. Eat more plant-based or vegetarian meals using beans and soy foods for protein. Eat whole, unprocessed foods to limit the amount of sodium (salt) you eat. Limit refined carbohydrates especially sugar, sweets and sugar-sweetened beverages. If you drink alcohol, do so in moderation: one serving per day (women) and two servings per day (men). One serving is equivalent to 12 ounces beer, 5 ounces wine, or 1.5 ounces distilled spirits  Tips Tips for Choosing Heart-Healthy Fats Choose lean protein and low-fat dairy foods to reduce saturated fat intake. Saturated fat is usually found in animal-based protein and is associated with certain health risks. Saturated fat is the biggest contributor to raised low-density lipoprotein (LDL) cholesterol levels in the diet. Research shows that limiting saturated fat lowers unhealthy cholesterol levels.  Eat no more than 5-6% of your total calories each day from saturated fat. Ask your registered dietitian nutritionist (RDN) to help you determine how much saturated fat is right for you. There are many foods that do not contain large amounts of saturated fats. Swapping these foods to replace foods high in saturated fats will help you limit the saturated fat you eat and improve your cholesterol levels. You can also try eating more plant-based or vegetarian meals. Instead of. Try:  Whole milk, cheese, yogurt, and ice cream 1%, %, or skim milk, low-fat cheese, non-fat yogurt, and low-fat ice cream  Fatty, marbled beef and pork Lean beef, pork, or venison  Poultry with skin Poultry without skin  Butter, stick margarine Reduced-fat, whipped, or liquid spreads  Coconut oil, palm oil Liquid vegetable oils: corn, canola, olive, soybean and safflower oils   Avoid trans fats. Trans fats increase levels of LDL-cholesterol. Hydrogenated fat in processed foods is the main source of trans fats in foods.  Trans fats can be found in stick margarine, shortening, processed sweets, baked goods, some fried foods, and packaged foods made with hydrogenated oils. Avoid foods with "partially hydrogenated oil" on the ingredient list such as: cookies, pastries, baked goods, biscuits, crackers, microwave popcorn, and frozen dinners. Choose foods with heart healthy fats. Polyunsaturated and monounsaturated fat are unsaturated fats that may help lower your blood cholesterol level when used in place of saturated fat in your diet. Ask your RDN about taking a dietary supplement with plant sterols and stanols to help lower your cholesterol level. Research shows that substituting saturated fats with unsaturated fats is beneficial to cholesterol levels. Try these easy swaps:   Instead of. Try:  Butter, stick margarine, or solid  shortening Reduced-fat, whipped, or liquid spreads  Beef, pork, or poultry with skin   Fish and seafood   Chips, crackers, snack foods Raw or unsalted nuts and seeds or nut butters Hummus with vegetables Avocado on toast  Coconut oil, palm oil Liquid vegetable oils: corn, canola, olive, soybean and safflower oils    Limit the amount of cholesterol you eat to less than 200 milligrams per day. Cholesterol is a substance carried through the bloodstream via lipoproteins, which are known as "transporters" of fat. Some body functions need cholesterol to work properly, but too much cholesterol in the bloodstream can damage arteries and build up blood vessel linings (which can lead to heart attack and stroke). You should eat less than 200 milligrams cholesterol per day. People respond differently to eating cholesterol. There is no test available right now that can figure out which people will respond more to dietary cholesterol and which will respond less. For individuals with high intake of dietary cholesterol, different types of increase (none, small, moderate, large) in LDL-cholesterol levels are all possible.   Food sources of cholesterol include egg yolks and organ meats such as liver, gizzards.  Limit egg yolks to two to four per week and avoid organ meats like liver and gizzards to control cholesterol intake.  Tips for Choosing Heart-Healthy Carbohydrates Consume foods rich in viscous (soluble) fiber Viscous, or soluble, fiber is found in the walls of plant cells. Viscous fiber is found only in plant-based foods--animal-based foods like meat or dairy products do not contain fiber. In the stomach, viscous fibers absorb water and swell to form a thick, jelly-like mass. This helps to lower your unhealthy cholesterol Rich sources of viscous fiber include asparagus, Brussels sprouts, sweet potatoes, turnips, apricots, mangoes, oranges, legumes, barley, oats, and oat bran. Eat at least 5 to 10 grams of viscous fiber each day. As you increase your fiber intake gradually, also increase the amount of water you  drink. This will help prevent constipation. If you have difficulty achieving this goal, ask your RDN about fiber laxatives. Choose fiber supplements made with viscous fibers such as psyllium seed husks or methylcellulose to help lower unhealthy cholesterol. Limit refined carbohydrates There are three types of carbohydrates: starches, sugar, and fiber. Some carbohydrates occur naturally in food, like the starches in rice or corn or the sugars in fruits and milk. Refined carbohydrates--foods with high amounts of simple sugars--can raise triglyceride levels. High triglyceride levels are associated with coronary heart disease. Some examples of refined carbohydrate foods are table sugar, sweets, and beverages sweetened with added sugar. Tips for Reducing Sodium (Salt) Although sodium is important for your body to function, too much sodium can be harmful for people with high blood pressure.  As sodium and fluid buildup in your tissues and bloodstream, your blood pressure increases. High blood pressure may cause damage to other organs and increase your risk for a stroke. Keep your salt intake to 2300 milligrams or less per day. Even if you take a pill for blood pressure or a water pill (diuretic) to remove fluid, it is still important to have less salt in your diet. Ask your RDN what amount of sodium is right for you. Avoid processed foods.  Eat more fresh foods. Fresh fruits and vegetables are naturally low in sodium, as well as frozen vegetables and fruits that have no added juices or sauces. Fresh meats are lower in sodium than processed meats, such as bacon, sausage, and hotdogs.  Read the nutrition label or ask  your butcher to help you find a fresh meat that is low in sodium. Eat less salt--at the table and when cooking. A single teaspoon of table salt has 2,300 mg of sodium. Leave the salt out of recipes for pasta, casseroles, and soups. Ask your RDN how to cook your favorite recipes without sodium Be  a smart shopper. Look for food packages that say "salt-free" or "sodium-free." These items contain less than 5 milligrams of sodium per serving. "Very low-sodium" products contain less than 35 milligrams of sodium per serving. "Low-sodium" products contain less than 140 milligrams of sodium per serving.  Beware of "reduced salt" or "reduced sodium" products.  These items may still be high in sodium. Check the nutrition label.  Add flavors to your food without adding sodium. Try lemon juice, lime juice, fruit juice or vinegar.   Dry or fresh herbs add flavor. Try basil, bay leaf, dill, rosemary, parsley, sage, dry mustard, nutmeg, thyme, and paprika. Pepper, red pepper flakes, and cayenne pepper can add spice to your meals without adding sodium. Hot sauce contains sodium, but if you use just a drop or two, it will not add up to much. Buy a sodium-free seasoning blend or make your own at home.    Additional Lifestyle Tips Achieve and maintain a healthy weight. Talk with your RDN or your doctor about what is a healthy weight for you. Set goals to reach and maintain that weight.  To lose weight, reduce your calorie intake along with increasing your physical activity. A weight loss of 10 to 15 pounds could reduce LDL-cholesterol by 5 milligrams per deciliter. Participate in physical activity. Talk with your health care team to find out what types of physical activity are best for you. Set a plan to get about 30 minutes of exercise on most days.   Foods to Choose or to Limit Food Group Foods to Choose Foods to Limit  Grains Whole grain breads and cereals, including whole wheat, barley, rye, buckwheat, corn, teff, quinoa, millet, amaranth, brown or wild rice, sorghum, and oats Pasta, especially whole wheat or other whole grain types [employer], quinoa or wild rice Whole grain crackers, bread, rolls, pitas Home-made bread with reduced-sodium baking soda Breads or crackers topped with salt Cereals  (hot or cold) with more than 300 mg sodium per serving Biscuits, cornbread, and other "quick" breads prepared with baking soda Bread crumbs or stuffing mix from a store High-fat bakery products, such as doughnuts, biscuits, croissants, danish pastries, pies, cookies Instant cooking foods to which you add hot water and stir--potatoes, noodles, rice, etc. Packaged starchy foods--seasoned noodle or rice dishes, stuffing mix, macaroni and cheese dinner Snacks made with partially hydrogenated oils, including chips, cheese puffs, snack mixes, regular crackers, butter-flavored popcorn  Protein Foods Lean cuts of beef and pork (loin, leg, round, extra lean hamburger) Skinless Press photographer and other wild game Dried beans and peas Nuts and nut butters (unsalted) Seeds and seed butters (unsalted) Meat alternatives made with soy or textured vegetable protein Egg whites or egg substitute Cold cuts made with lean meat or soy protein Higher-fat cuts of meats (ribs, t-bone steak, regular hamburger) Bacon, sausage, or hot dogs Cold cuts, such as salami or bologna, deli meats, cured meats, corned beef Organ meats (liver, brains, gizzards, sweetbreads) Poultry with skin Fried or smoked meat, poultry, and fish Whole eggs and egg yolks (more than 2-4 per week) Salted legumes, nuts, seeds, or nut/seed butters Meat alternatives with high levels of sodium (>300  mg per serving) or saturated fat (>5 g per serving)  Dairy and Dairy Alternatives Nonfat (skim), low-fat, or 1%-fat milk Nonfat or low-fat yogurt or cottage cheese Fat-free and low-fat cheese Fortified non-dairy milk: almond, cashew, pea, and soy Whole milk, 2% fat milk, buttermilk Whole milk yogurt or ice cream Cream, half-&-half Cream cheese Sour cream Cheese  Vegetables Fresh, frozen, or canned vegetables without added fat or salt Avocados Canned or frozen vegetables with salt, fresh vegetables prepared with salt, butter, cheese, or  cream sauce Fried vegetables Pickled vegetables such as olives, pickles, or sauerkraut  Fruits Fresh, frozen, canned, or dried fruit Fried fruits Fruits served with butter or cream  Oils Unsaturated oils (corn, olive, peanut, soy, sunflower, canola) Soft or liquid margarines and vegetable oil spreads Salad dressings made from saturated fats Butter, stick margarine, shortening Partially hydrogenated oils or trans fats Tropical oils (coconut, palm, palm kernel oils)  Other Prepared or homemade foods, including soups, casseroles, and salads made from recommended ingredients and contain <600 mg sodium. Low-sodium seasonings (ketchup, barbeque sauce) Spices, herbs, Salt-free seasoning mixes and marinades Vinegar Lemon or lime juice Prepared foods, including soups, casseroles, and salads made from recommended ingredients and contain >600 mg sodium. Frozen meals and prepared sides that are >600 mg of sodium Sugary and/or fatty desserts, candy, and other sweets Salts:  sea salt, kosher salt, onion salt, and garlic salt, seasoning mixes containing salt Flavorings: bouillon cubes, catsup or ketchup, barbeque sauce, Worcestershire sauce, soy sauce, salsa, relish, teriyaki sauce   Heart-Healthy Sample 1-Day Menu View Nutrient Info Breakfast 1 cup oatmeal 1 cup fat-free milk 1 cup blueberries 1 ounce almonds, unsalted 1 cup brewed coffee  Lunch 2 slices whole-wheat bread 2 ounces lean [place] breast 1 ounce low-fat Swiss cheese 2 slices tomato 2 lettuce leaves 1 pear 1 cup skim milk  Afternoon Snack 1 ounce trail mix with unsalted nuts, seeds, and raisins  Evening Meal 3 ounces broiled salmon 2/3 cup brown rice 1 teaspoon margarine, soft tub  cup broccoli, cooked  cup carrots, cooked 1 cup tossed salad 1 teaspoon olive oil and vinegar dressing 1 small whole-wheat roll 1 teaspoon margarine, soft tub 1 cup tea  Evening Snack 1 small banana  Daily Sum Nutrient Unit Value   Macronutrients  Energy kcal 2069  Energy kJ 8655  Protein g 146  Total lipid (fat) g 69  Carbohydrate, by difference g 228  Fiber, total dietary g 33  Sugars, total g 85  Minerals  Calcium, Ca mg 1292  Iron, Fe mg 14  Sodium, Na mg 1751  Vitamins  Vitamin C, total ascorbic acid mg 85  Vitamin A, IU IU 17756  Vitamin D IU 231  Lipids  Fatty acids, total saturated g 12  Fatty acids, total monounsaturated g 27  Fatty acids, total polyunsaturated g 24  Cholesterol mg 270     Heart-Healthy Vegan 1-Day Sample Menu View Nutrient Info Breakfast 1 slice whole wheat toast 2 tablespoons peanut butter without salt Tofu scramble made with:  cup calcium-set tofu  cup green pepper  cup spinach  cup tomatoes  cup white mushrooms 1 teaspoon canola oil 1 cup soymilk fortified with calcium, vitamin B12, and vitamin D  Lunch 1 cup reduced sodium split pea soup 1 whole wheat dinner roll 1 medium apple  Dinner Salad made with: 1 cup lentils  cup cooked broccoli  cup cooked carrots 2 tablespoons hummus 1 cup sliced strawberries 1 cup soymilk fortified with calcium, vitamin B12, and  vitamin D  Evening Snack 1 cup soy yogurt  cup mixed nuts  Daily Sum Nutrient Unit Value  Macronutrients  Energy kcal 1672  Energy kJ 7000  Protein g 86  Total lipid (fat) g 65  Carbohydrate, by difference g 205  Fiber, total dietary g 47  Sugars, total g 73  Minerals  Calcium, Ca mg 1443  Iron, Fe mg 19  Sodium, Na mg 1148  Vitamins  Vitamin C, total ascorbic acid mg 253  Vitamin A, IU IU 15451  Vitamin D IU 361  Lipids  Fatty acids, total saturated g 11  Fatty acids, total monounsaturated g 29  Fatty acids, total polyunsaturated g 19  Cholesterol mg 5     Heart-Healthy Vegetarian (Lacto-Ovo) Sample 1-Day Menu View Nutrient Info Breakfast 1 cup cooked oatmeal 1 tablespoon ground flaxseed 1 cup blueberries 2 scrambled egg whites with 1 teaspoon canola oil 2 tablespoons salsa 1  cup fat-free milk 1 cup coffee Oil, canola  Lunch 2 slices whole wheat bread 2 tablespoons peanut butter without salt 1 small banana 6 ounces fat-free plain yogurt 1 cup sliced red pepper 2 tablespoons hummus 1 cup fat-free milk  Evening Meal Stir fry made with:  cup tofu 1 cup brown rice  cup cooked broccoli  cup cooked carrots  cup cooked green beans 1 teaspoon peanut oil  Evening Snack 1 slice low-fat mozzarella cheese 1 medium apple  Daily Sum Nutrient Unit Value  Macronutrients  Energy kcal 1764  Energy kJ 7375  Protein g 87  Total lipid (fat) g 53  Carbohydrate, by difference g 254  Fiber, total dietary g 35  Sugars, total g 98  Minerals  Calcium, Ca mg 1609  Iron, Fe mg 12  Sodium, Na mg 1434  Vitamins  Vitamin C, total ascorbic acid mg 210  Vitamin A, IU IU 16317  Vitamin D IU 234  Lipids  Fatty acids, total saturated g 12  Fatty acids, total monounsaturated g 21  Fatty acids, total polyunsaturated g 15  Cholesterol mg 31    Copyright 2020  [employer]. All rights reserved

## 2023-06-08 DIAGNOSIS — R112 Nausea with vomiting, unspecified: Secondary | ICD-10-CM | POA: Diagnosis not present

## 2023-06-08 DIAGNOSIS — R197 Diarrhea, unspecified: Secondary | ICD-10-CM | POA: Diagnosis not present

## 2023-06-08 MED ORDER — METOPROLOL TARTRATE 25 MG PO TABS: 12.5000 mg | ORAL_TABLET | Freq: Two times a day (BID) | ORAL | 0 refills | Status: DC

## 2023-06-08 MED ORDER — FUROSEMIDE 20 MG PO TABS: 20.0000 mg | ORAL_TABLET | Freq: Every day | ORAL | 0 refills | Status: DC

## 2023-06-08 NOTE — Discharge Summary (Signed)
Physician Discharge Summary  Ellis Cowell GNF:[id number] DOB: 09/01/1946 DOA: 06/02/2023  PCP: Pincus Sanes, MD  Admit date: 06/02/2023 Discharge date: 06/08/2023  Admitted From: home Discharge disposition: home   Recommendations for Outpatient Follow-Up:   Needs updated sleep study Neurology referral for evaluation of memory Cbc, BMP, CK 1 week Referral placed for podiatry Outpatient PT Consider screening for depression    Discharge Diagnosis:   Principal Problem:   Nausea vomiting and diarrhea Active Problems:   AKI (acute kidney injury) (HCC)   Confusion   Essential hypertension   Polycythemia   Thrombocytopenia (HCC)   Diarrhea    Discharge Condition: Improved.  Diet recommendation: Low sodium, heart healthy  Wound care: None.  Code status: Full.   History of Present Illness:   Tiofilo Show is a 77 y.o. male with medical history significant of peripheral edema on lasix, OSA, morbid obesity, HTN.   Pt in to ED with c/o diarrhea.  States this ongoing for past few days.  Diarrhea non bloody and no melena.  Told other providers he had some N/V but then denies it later and at time of my exam. Today felt very dizzy and weak and slumped over to the ground. Did not hit his head or lose consciousness. Not on any blood thinners.  Says that he was crawling around on the ground because he was unable to get up because he felt too weak and family members found him and called 911. Denies any chest pain or shortness of breath.  Denies any respiratory issues to me at time of my exam.   No fevers. No injuries as result of the fall. Former smoker but no alcohol current tobacco use or drug use.  Family reported that he has been more confused than usual recently from this illness.   At time of evaluation in ED: pt is AAOx3, can even correctly tell date, however does get a few things incorrect (for example says he moved to area recently but seems he's actually been  in area for past few years, etc).   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 CT head WNL -B12 low 400s/ammonia slightly elevated -would encourage compliance with CPAP -outpatient follow up with neurology   Thrombocytopenia (HCC) -trend, stable   Polycythemia H/o Polycythemia previously, though HGB in normal ranges during more recent months. HGB of 18.5 today which is elevated compared to baseline. -suspect related to CPAP noncompliance   Essential hypertension Midodrine/IV lasix -hold BB for now   Obesity Estimated body mass index is 43.45 kg/m as calculated from the following:   Height as of this encounter: 5\' 6"  (1.676 m).   Weight as of this encounter: 122.1 kg.    Hypokalemia -replete   OSA -not compliant with CPAP per family   Appears to be nearing d/c and optimal  hospitalization management: -will need neurology follow up-- referral placed -Outpatient podiatry referral for plantar fascitis/toenail care- referral placed -close PCP and cardiology follow up -needs increased activity -compliance with CPAP and new sleep study -low salt diet (family says eats salty snacks all day)    Medical Consultants:   cards   Discharge Exam:   Vitals:   06/08/23 0805 06/08/23 0920  BP: (!) 157/127 130/66  Pulse: 70   Resp:  20   Temp: (!) 97.5 F (36.4 C)   SpO2: 95%    Vitals:   06/07/23 2205 06/08/23 0421 06/08/23 0805 06/08/23 0920  BP: 120/73 135/81 (!) 157/127 130/66  Pulse: 68 66 70   Resp: 18 18 20    Temp: 98.9 F (37.2 C) 97.6 F (36.4 C) (!) 97.5 F (36.4 C)   TempSrc:   Oral   SpO2: 93% 91% 95%   Weight:      Height:        General exam: Appears calm and comfortable.    The results of significant diagnostics from this hospitalization (including imaging, microbiology, ancillary and laboratory) are listed below for reference.     Procedures and Diagnostic Studies:   DG Chest Portable 1 View  Result Date: 06/02/2023 CLINICAL DATA:  Cough and shortness of breath for 2 days EXAM: PORTABLE CHEST 1 VIEW COMPARISON:  04/30/23 FINDINGS: Cardiac shadow is within normal limits. Lungs are well aerated bilaterally. No focal infiltrate or effusion is seen. No bony abnormality is noted. IMPRESSION: No active disease. Electronically Signed   By: Alcide Clever M.D.   On: 06/02/2023 20:58     Labs:   Basic Metabolic Panel: Recent Labs  Lab 06/02/23 2000 06/03/23 0836 06/04/23 0226 06/05/23 0305 06/06/23 0153 06/07/23 0046  NA 140 139 136 140 138 143  K 3.3* 2.9* 3.8 3.1* 3.2* 3.6  CL 109 110 107 109 106 106  CO2 19* 20* 19* 24 23 23   GLUCOSE 108* 127* 92 83 93 82  BUN 17 17 16 16 17 21   CREATININE 1.49* 1.46* 1.36* 1.28* 1.21 1.09  CALCIUM 8.4* 8.2* 8.2* 8.2* 8.1* 8.6*  MG 1.5* 1.8  --   --   --   --    GFR Estimated Creatinine Clearance: 68.8 mL/min (by C-G formula based on SCr of 1.09 mg/dL). Liver Function Tests: Recent Labs  Lab 06/02/23 2000 06/03/23 0836  AST 44* 59*  ALT 16 25  ALKPHOS 53 48  BILITOT 1.0 0.4  PROT 6.2* 6.0*  ALBUMIN 3.4* 3.2*   Recent Labs  Lab 06/02/23 2000  LIPASE 26   Recent Labs  Lab 06/05/23 0305  AMMONIA 38*   Coagulation profile Recent Labs  Lab 06/03/23 0836  INR 1.2    CBC: Recent Labs  Lab 06/02/23 2000 06/03/23 0836  06/04/23 0226 06/05/23 0305 06/07/23 0046  WBC 13.5* 13.6* 11.1* 7.4 8.7  NEUTROABS 11.4*  --   --   --   --   HGB 18.5* 17.2* 17.1* 16.6 15.7  HCT 53.8* 50.6 50.9 50.0 46.6  MCV 89.5 87.5 87.3 89.1 87.9  PLT 85* 113*  112* 102* 98* 114*   Cardiac Enzymes: Recent Labs  Lab 06/02/23 2000 06/05/23 0305 06/06/23 0153 06/07/23 0046  CKTOTAL 1,165* 2,529* 2,866* 1,866*   BNP: Invalid input(s): "POCBNP" CBG: No results for input(s): "GLUCAP" in the last 168 hours. D-Dimer No results for input(s): "DDIMER" in the last 72 hours. Hgb A1c No results for input(s): "HGBA1C" in the last 72 hours.  Lipid Profile No results for input(s): "CHOL", "HDL", "LDLCALC", "TRIG", "CHOLHDL", "LDLDIRECT" in the last 72 hours. Thyroid function studies Recent Labs    06/06/23 0153  TSH 2.211   Anemia work up No results for input(s): "VITAMINB12", "FOLATE", "FERRITIN", "TIBC", "IRON", "RETICCTPCT" in the last 72 hours. Microbiology Recent Results (from the past 240 hour(s))  Resp panel by RT-PCR (RSV, Flu A&B, Covid) Anterior Nasal Swab     Status: None   Collection Time: 06/02/23  7:55 PM   Specimen: Anterior Nasal Swab  Result Value Ref Range Status   SARS Coronavirus 2 by RT PCR NEGATIVE NEGATIVE Final   Influenza A by PCR NEGATIVE NEGATIVE Final   Influenza B by PCR NEGATIVE NEGATIVE Final    Comment: (NOTE) The Xpert Xpress SARS-CoV-2/FLU/RSV plus assay is intended as an aid in the diagnosis of influenza from Nasopharyngeal swab specimens and should not be used as a sole basis for treatment. Nasal washings and aspirates are unacceptable for Xpert Xpress SARS-CoV-2/FLU/RSV testing.  Fact Sheet for Patients: BloggerCourse.com  Fact Sheet for Healthcare Providers: SeriousBroker.it  This test is not yet approved or cleared by the [place] FDA and has been authorized for detection and/or diagnosis of SARS-CoV-2 by FDA under an  Emergency Use Authorization (EUA). This EUA will remain in effect (meaning this test can be used) for the duration of the COVID-19 declaration under Section 564(b)(1) of the Act, 21 U.S.C. section 360bbb-3(b)(1), unless the authorization is terminated or revoked.     Resp Syncytial Virus by PCR NEGATIVE NEGATIVE Final    Comment: (NOTE) Fact Sheet for Patients: BloggerCourse.com  Fact Sheet for Healthcare Providers: SeriousBroker.it  This test is not yet approved or cleared by the [place] FDA and has been authorized for detection and/or diagnosis of SARS-CoV-2 by FDA under an Emergency Use Authorization (EUA). This EUA will remain in effect (meaning this test can be used) for the duration of the COVID-19 declaration under Section 564(b)(1) of the Act, 21 U.S.C. section 360bbb-3(b)(1), unless the authorization is terminated or revoked.  Performed at [facility], [address] [place], [place] [address]   Gastrointestinal Panel by PCR , Stool     Status: Abnormal   Collection Time: 06/03/23  6:27 AM   Specimen: Urine, Clean Catch; Stool  Result Value Ref Range Status   Campylobacter species DETECTED (A) NOT DETECTED Final    Comment: RESULT CALLED TO, READ BACK BY AND VERIFIED WITH: EMMANUEL CASTRO 06/03/23 2056 MU    Plesimonas shigelloides NOT DETECTED NOT DETECTED Final   Salmonella species NOT DETECTED NOT DETECTED Final   Yersinia enterocolitica NOT DETECTED NOT DETECTED Final   Vibrio species NOT DETECTED NOT DETECTED Final   Vibrio cholerae NOT DETECTED NOT DETECTED Final   Enteroaggregative E coli (EAEC) NOT DETECTED NOT DETECTED Final   Enteropathogenic E coli (EPEC) NOT DETECTED NOT DETECTED Final   Enterotoxigenic E coli (ETEC) NOT DETECTED NOT DETECTED Final   Shiga like toxin producing E coli (STEC) NOT DETECTED NOT DETECTED Final   Shigella/Enteroinvasive E coli (EIEC) NOT DETECTED NOT DETECTED Final    Cryptosporidium NOT DETECTED NOT DETECTED Final   Cyclospora cayetanensis NOT DETECTED NOT DETECTED Final   Entamoeba histolytica NOT DETECTED NOT DETECTED Final   Giardia lamblia NOT DETECTED NOT DETECTED Final   Adenovirus F40/41 NOT DETECTED NOT DETECTED Final   Astrovirus NOT DETECTED NOT DETECTED Final   Norovirus GI/GII NOT DETECTED NOT DETECTED Final   Rotavirus A NOT DETECTED NOT  DETECTED Final   Sapovirus (I, II, IV, and V) NOT DETECTED NOT DETECTED Final    Comment: Performed at [facility], [address] [place], [place] [address]  C Difficile Quick Screen w PCR reflex     Status: None   Collection Time: 06/03/23 11:01 AM   Specimen: STOOL  Result Value Ref Range Status   C Diff antigen NEGATIVE NEGATIVE Final   C Diff toxin NEGATIVE NEGATIVE Final   C Diff interpretation No C. difficile detected.  Final    Comment: Performed at [facility], [address] [place], [place] [address]     Discharge Instructions:   Discharge Instructions     Ambulatory referral to Neurology   Complete by: As directed    An appointment is requested in approximately: 4 weeks Neurocognitive evaluation   Ambulatory referral to Physical Therapy   Complete by: As directed    [facility]   Ambulatory referral to Podiatry   Complete by: As directed    Ambulatory referral to Pulmonology   Complete by: As directed    Follows with Dr. Alvia Grove-- needs repeat OSA eval   Reason for referral: Other      Allergies as of 06/08/2023       Reactions   Trazodone And Nefazodone Other (See Comments)   Dry mouth        Medication List     STOP taking these medications    hydrochlorothiazide 12.5 MG tablet Commonly known as: HYDRODIURIL       TAKE these medications    acetaminophen 500 MG tablet Commonly known as: TYLENOL Take 1,000 mg by mouth as needed for moderate pain.   albuterol 108 (90 Base) MCG/ACT inhaler Commonly known as: VENTOLIN  HFA Inhale 2 puffs into the lungs as needed for wheezing or shortness of breath.   CENTRUM SILVER PO Take 1 tablet by mouth daily.   furosemide 20 MG tablet Commonly known as: LASIX Take 1 tablet (20 mg total) by mouth daily. What changed:  medication strength how much to take   metoprolol tartrate 25 MG tablet Commonly known as: LOPRESSOR Take 0.5 tablets (12.5 mg total) by mouth 2 (two) times daily. What changed:  how much to take when to take this   omeprazole 20 MG capsule Commonly known as: PRILOSEC TAKE 1 CAPSULE BY MOUTH IN THE MORNING AND AT BEDTIME What changed: See the new instructions.   OVER THE COUNTER MEDICATION Take 1 capsule by mouth daily. Zantrex black   Trelegy Ellipta 100-62.5-25 MCG/ACT Aepb Generic drug: Fluticasone-Umeclidin-Vilant Inhale 1 puff into the lungs daily.        Follow-up Information     [place] [facility] at [facility] Follow up.   Why: Call to schedule first appointment                 Time coordinating discharge: 45 min  Signed:  Joseph Art DO  [facility] 06/08/2023, 9:44 AM

## 2023-06-08 NOTE — Progress Notes (Signed)
Pt d/c to self care. Reviewed AVS with pt and informed him of changes to his medication list. Made pt aware that he had pending prescriptions to be picked up from his preferred pharmacy. Answered any pending questions. Pt had no further questions. Removed PIV which was CDI and free from s/sx of infection upon removal. Assisted pt in getting dressed and gathering his belongings. Assisted pt into wheelchair where he was escorted accompanied by Care RN and his family to the front of the visitors entrance where his ride awaited to take him home. Pt d/c in stable condition.

## 2023-06-09 ENCOUNTER — Encounter: Payer: Self-pay | Admitting: *Deleted

## 2023-06-09 ENCOUNTER — Telehealth: Payer: Self-pay | Admitting: *Deleted

## 2023-06-09 NOTE — Transitions of Care (Post Inpatient/ED Visit) (Signed)
   06/09/2023  Name: Justin Padilla MRN: [id number] DOB: 09-24-1946  Today's TOC FU Call Status: Today's TOC FU Call Status:: Unsuccessul Call (1st Attempt) Unsuccessful Call (1st Attempt) Date: 06/09/23  Attempted to reach the patient regarding the most recent Inpatient visit; left HIPAA compliant voice message requesting call back  Follow Up Plan: Additional outreach attempts will be made to reach the patient to complete the Transitions of Care (Post Inpatient visit) call.   Caryl Pina, RN, BSN, CCRN Alumnus RN CM Care Coordination/ Transition of Care- [facility] Care Management [phone]: direct office

## 2023-06-09 NOTE — Transitions of Care (Post Inpatient/ED Visit) (Signed)
06/09/2023  Name: Justin Padilla MRN: [id number] DOB: [birth year]  Today's TOC FU Call Status: Today's TOC FU Call Status:: Successful TOC FU Call Competed TOC FU Call Complete Date: 06/09/23  Transition Care Management Follow-up Telephone Call Date of Discharge: 06/08/23 Discharge Facility: [facility]) Type of Discharge: Inpatient Admission Primary Inpatient Discharge Diagnosis:: N/V/D- AKI How have you been since you were released from the hospital?: Better ("Overall I am doing much better, not having any problems at the moment and my niece is keeping a very close eye on me, she lives nearby.  I can't go see Dr. Lawerance Bach on 06/19/23, I will go on 06/23/23") Any questions or concerns?: No  Items Reviewed: Did you receive and understand the discharge instructions provided?: Yes (thoroughly reviewed with patient who verbalizes good understanding of same) Medications obtained,verified, and reconciled?: Yes (Medications Reviewed) (Full medication reconciliation/ review completed; no concerns or discrepancies identified; confirmed patient obtained/ is taking all newly Rx'd medications as instructed; self-manages medications and denies questions/ concerns around medications today) Any new allergies since your discharge?: No Dietary orders reviewed?: Yes Type of Diet Ordered:: Heart Healthy, low salt Do you have support at home?: Yes People in Home: alone Name of Support/Comfort Primary Source: Reports independent in self-care activities; resides alone; supportive local niece assists as/ if needed/ indicated  Medications Reviewed Today: Medications Reviewed Today     Reviewed by Michaela Corner, RN (Registered Nurse) on 06/09/23 at 1257  Med List Status: <None>   Medication Order Taking? Sig Documenting Provider Last Dose Status Informant  acetaminophen (TYLENOL) 500 MG tablet [id number] Yes Take 1,000 mg by mouth as needed for moderate pain. [provider] Taking Active Family  Member, Pharmacy Records  albuterol (VENTOLIN HFA) 108 (90 Base) MCG/ACT inhaler [id number] Yes Inhale 2 puffs into the lungs as needed for wheezing or shortness of breath. [provider] Taking Active Family Member, Pharmacy Records           Med Note (CRUTHIS, Marcy Siren   Wed Jun 03, 2023  9:48 AM) Family is unaware of last dose.   Fluticasone-Umeclidin-Vilant (TRELEGY ELLIPTA) 100-62.5-25 MCG/ACT AEPB [id number] Yes Inhale 1 puff into the lungs daily. Pincus Sanes, MD Taking Active Family Member, Pharmacy Records           Med Note Epimenio Sarin, Con Memos Jun 03, 2023  9:48 AM) Family is unaware of last dose.   furosemide (LASIX) 20 MG tablet [id number] Yes Take 1 tablet (20 mg total) by mouth daily. Joseph Art, DO Taking Active   metoprolol tartrate (LOPRESSOR) 25 MG tablet [id number] Yes Take 0.5 tablets (12.5 mg total) by mouth 2 (two) times daily. Joseph Art, DO Taking Active   Multiple Vitamins-Minerals (CENTRUM SILVER PO) [id number] Yes Take 1 tablet by mouth daily. [provider] Taking Active Family Member, Pharmacy Records  omeprazole (PRILOSEC) 20 MG capsule [id number] Yes TAKE 1 CAPSULE BY MOUTH IN THE MORNING AND AT BEDTIME  Patient taking differently: Take 20 mg by mouth daily.   Jenel Lucks, MD Taking Active Family Member, Pharmacy Records  OVER THE COUNTER MEDICATION [id number] Yes Take 1 capsule by mouth daily. Zantrex black [provider] Taking Active Family Member, Pharmacy Records           Med Note (CRUTHIS, Marcy Siren   Wed Jun 03, 2023  9:48 AM) Family is unaware of last dose.            Home Care  and Equipment/Supplies: Were Home Health Services Ordered?: No Any new equipment or medical supplies ordered?: No  Functional Questionnaire: Do you need assistance with bathing/showering or dressing?: No Do you need assistance with meal preparation?: No Do you need assistance with eating?: No Do you have difficulty maintaining  continence: No Do you need assistance with getting out of bed/getting out of a chair/moving?: No Do you have difficulty managing or taking your medications?: No  Follow up appointments reviewed: PCP Follow-up appointment confirmed?: Yes (care coordination outreach in real-time with scheduling care guide to successfully schedule hospital follow up PCP appointment 06/23/23) Date of PCP follow-up appointment?: 06/23/23 (patient adamantly declines sooner appointment date which was offered for 06/19/23) Follow-up Provider: PCP Specialist Hospital Follow-up appointment confirmed?: No Reason Specialist Follow-Up Not Confirmed: Patient has Specialist Provider Number and will Call for Appointment Do you need transportation to your follow-up appointment?: No Do you understand care options if your condition(s) worsen?: Yes-patient verbalized understanding  SDOH Interventions Today    Flowsheet Row Most Recent Value  SDOH Interventions   Food Insecurity Interventions Intervention Not Indicated  Transportation Interventions Intervention Not Indicated  [reports drives self]      TOC Interventions Today    Flowsheet Row Most Recent Value  TOC Interventions   TOC Interventions Discussed/Reviewed TOC Interventions Discussed, Arranged PCP follow up less than 12 days/Care Guide scheduled  [PCP appointment per patient's preference 15 days post-discharge: he admantly declined sooner appointments which were offered,  admantly declined care coordination services, including taking my direct phone number]      Interventions Today    Flowsheet Row Most Recent Value  Chronic Disease   Chronic disease during today's visit Other  [N/V/D- AKI]  General Interventions   General Interventions Discussed/Reviewed General Interventions Discussed, Durable Medical Equipment (DME), Doctor Visits  Doctor Visits Discussed/Reviewed Doctor Visits Discussed, PCP  Durable Medical Equipment (DME) Other  [confirmed not  currently requiring/ using assistive devices]  PCP/Specialist Visits Compliance with follow-up visit  Exercise Interventions   Exercise Discussed/Reviewed Exercise Discussed  [reports active at [employer] on a regular basis]  Education Interventions   Education Provided Provided Education  Provided Verbal Education On Other  [reinforced rationale for daily weight monitoring at home along with weight gain guidelines/ action plan for weight gain,  importance of taking diuretic as prescribed,  adherence to daily fluid restrictions]  Nutrition Interventions   Nutrition Discussed/Reviewed Nutrition Discussed  Pharmacy Interventions   Pharmacy Dicussed/Reviewed Pharmacy Topics Discussed  [Full medication review with updating medication list in EHR per patient report]      Caryl Pina, RN, BSN, CCRN Alumnus RN CM Care Coordination/ Transition of Care- [facility] Care Management [phone]: direct office

## 2023-06-11 ENCOUNTER — Encounter: Payer: Self-pay | Admitting: Internal Medicine

## 2023-06-11 ENCOUNTER — Telehealth: Payer: Self-pay | Admitting: *Deleted

## 2023-06-11 DIAGNOSIS — R69 Illness, unspecified: Secondary | ICD-10-CM | POA: Diagnosis not present

## 2023-06-11 NOTE — Progress Notes (Unsigned)
Subjective:    Patient ID: Justin Padilla, male    DOB: 1946-03-07, 77 y.o.   MRN: [id number]     HPI Justin Padilla is here for follow up from the hospital He is here today with his niece and nephew.  Recommendations for Outpatient Follow-Up:    Needs updated sleep study Neurology referral for evaluation of memory Cbc, BMP, CK 1 week Referral placed for podiatry Outpatient PT Consider screening for depression    Admitted 7/24 - 7/29   Cc: diarrhea x few days.    Diarrhea was non bloody, no melena.  ? N/V - initially said he had some but later denied it.  Felt weak, dizzy DOA and fell.  No head trauma or LOC.  He was not able to get up because he was too weak.  Family member found him and called 911.  No CP, SOB, fever.  No injuries from fall.  ED - AAOx3.   No abd tenderness.  Tachycardic.  Diarrhea -  Stool studies revealed campylobacter infection S/p azithromycin x 3 days Lactobacillus Much improved  Elevated CK -  Peaked and trended down check as outpatient  OSA No sleep study in 10+ years Needs titration of cpap and compliance  Acute on chronic diastolic HF, grade 1 -  Chronic LE edema - worsening x few months Echo with DD and volume overload IV lasix given - down 6 lbs BB resumed Outpatient cards f/u  AKI -  Resolved  Confusion -  Increased confusion from baseline in setting of acute illness per family Probable mild delirium Ct head wnl B12 low 400's, ammonia slightly elevated Stressed compliance with cpap Outpatient f/u with neurology  Thrombocytopenia -  Stable, trended  Polycythemia -  H/o polycythemia Hgb 18.5 - possibly related to cpap noncompliance  Htn -  Received midodrine - IV lasix BB held - resumed at discharge  Hypokalemia -  Repleted  OSA -  Non compliant w cpap per family  Referrals ordered for cardio, pulm, neuro, PT, podiatry.  He already has appointments for all.  Discussed that he could try to put himself on a  cancellation list for pulmonary so that he can be seen sooner for evaluation of the sleep apnea/CPAP.  Per his family this episode really let them noticed that his memory has not been good probably for a while.  They also states his diet has not been good-he is not compliant with a low sugar or low sodium diet.  He has not been compliant with his CPAP.  Feeling good-has no big concerns.  He is frustrated that he is not able to drive at this time until his sleep apnea is evaluated.  His niece and nephew threw away all of his trunk food in his house and have him on a low sugar/sodium diet which she is following.  He has lost a few pounds.    Medications and allergies reviewed with patient and updated if appropriate.  Current Outpatient Medications on File Prior to Visit  Medication Sig Dispense Refill   acetaminophen (TYLENOL) 500 MG tablet Take 1,000 mg by mouth as needed for moderate pain.     albuterol (VENTOLIN HFA) 108 (90 Base) MCG/ACT inhaler Inhale 2 puffs into the lungs as needed for wheezing or shortness of breath.     Fluticasone-Umeclidin-Vilant (TRELEGY ELLIPTA) 100-62.5-25 MCG/ACT AEPB Inhale 1 puff into the lungs daily. 1 each 11   furosemide (LASIX) 20 MG tablet Take 1 tablet (20 mg total) by  mouth daily. 30 tablet 0   metoprolol tartrate (LOPRESSOR) 25 MG tablet Take 0.5 tablets (12.5 mg total) by mouth 2 (two) times daily. 60 tablet 0   Multiple Vitamins-Minerals (CENTRUM SILVER PO) Take 1 tablet by mouth daily.     omeprazole (PRILOSEC) 20 MG capsule TAKE 1 CAPSULE BY MOUTH IN THE MORNING AND AT BEDTIME (Patient taking differently: Take 20 mg by mouth daily.) 120 capsule 0   OVER THE COUNTER MEDICATION Take 1 capsule by mouth daily. Zantrex black     No current facility-administered medications on file prior to visit.     Review of Systems  Constitutional:  Negative for fever.  Respiratory:  Negative for cough, shortness of breath and wheezing.   Cardiovascular:   Positive for leg swelling (improved). Negative for chest pain and palpitations.  Gastrointestinal:  Negative for abdominal pain, constipation, diarrhea and nausea.  Neurological:  Negative for light-headedness and headaches.       Objective:   Vitals:   06/12/23 1547  BP: 120/76  Pulse: 72  Temp: 98.4 F (36.9 C)  SpO2: 92%   BP Readings from Last 3 Encounters:  06/12/23 120/76  06/08/23 130/66  05/01/23 128/82   Wt Readings from Last 3 Encounters:  06/12/23 264 lb (119.7 kg)  06/06/23 269 lb 3.2 oz (122.1 kg)  05/01/23 276 lb (125.2 kg)   Body mass index is 42.61 kg/m.    Physical Exam Constitutional:      General: He is not in acute distress.    Appearance: Normal appearance. He is not ill-appearing.  HENT:     Head: Normocephalic and atraumatic.  Eyes:     Conjunctiva/sclera: Conjunctivae normal.  Cardiovascular:     Rate and Rhythm: Normal rate and regular rhythm.     Heart sounds: Normal heart sounds.  Pulmonary:     Effort: Pulmonary effort is normal. No respiratory distress.     Breath sounds: Normal breath sounds. No wheezing or rales.  Musculoskeletal:     Right lower leg: Edema (mild) present.     Left lower leg: Edema (mild) present.  Skin:    General: Skin is warm and dry.     Findings: No rash.  Neurological:     Mental Status: He is alert. Mental status is at baseline.  Psychiatric:        Mood and Affect: Mood normal.        Lab Results  Component Value Date   WBC 8.7 06/07/2023   HGB 15.7 06/07/2023   HCT 46.6 06/07/2023   PLT 114 (L) 06/07/2023   GLUCOSE 82 06/07/2023   CHOL 121 07/31/2022   TRIG 79.0 07/31/2022   HDL 39.60 07/31/2022   LDLCALC 65 07/31/2022   ALT 25 06/03/2023   AST 59 (H) 06/03/2023   NA 143 06/07/2023   K 3.6 06/07/2023   CL 106 06/07/2023   CREATININE 1.09 06/07/2023   BUN 21 06/07/2023   CO2 23 06/07/2023   TSH 2.211 06/06/2023   PSA 3.26 11/07/2019   INR 1.2 06/03/2023   HGBA1C 6.0 01/29/2023    CT HEAD WO CONTRAST ( ) CLINICAL DATA:  Mental status change, unknown cause.  EXAM: CT HEAD WITHOUT CONTRAST  TECHNIQUE: Contiguous axial images were obtained from the base of the skull through the vertex without intravenous contrast.  RADIATION DOSE REDUCTION: This exam was performed according to the departmental dose-optimization program which includes automated exposure control, adjustment of the mA and/or kV according to patient size and/or use  of iterative reconstruction technique.  COMPARISON:  None Available.  FINDINGS: Brain: No acute infarct, hemorrhage, or mass lesion is present. The ventricles are of normal size. Deep brain nuclei are within normal limits. No significant extraaxial fluid collection is present.  The brainstem and cerebellum are within normal limits. Midline structures are within normal limits.  Vascular: No hyperdense vessel or unexpected calcification.  Skull: Calvarium is intact. No focal lytic or blastic lesions are present. No significant extracranial soft tissue lesion is present.  Sinuses/Orbits: The paranasal sinuses and mastoid air cells are clear.  IMPRESSION: Negative CT of the head.  Electronically Signed   By: Marin Roberts M.D.   On: 06/04/2023 18:32 ECHOCARDIOGRAM COMPLETE    ECHOCARDIOGRAM REPORT       Patient Name:   ALFONSA VAILE Date of Exam: 06/04/2023 Medical Rec #:  [id number]        Height:       66.0 in Accession #:    [id number]       Weight:       275.0 lb Date of Birth:  08/20/1946        BSA:          2.290 m Patient Age:    89 years         BP:           96/68 mmHg Patient Gender: M                HR:           94 bpm. Exam Location:  Inpatient  Procedure: 2D Echo, Cardiac Doppler, Color Doppler and Intracardiac            Opacification Agent  Indications:     CHF-Acute Systolic I50.21   History:         Patient has prior history of Echocardiogram examinations, most                  recent  01/30/2022. CHF, Arrythmias:RBBB,                  Signs/Symptoms:Shortness of Breath; Risk Factors:Hypertension                  and Sleep Apnea.   Sonographer:     Lucendia Herrlich Referring Phys:  [id number] JESSICA Juanetta Gosling Diagnosing Phys: Freida Busman McleanMD  IMPRESSIONS   1. Left ventricular ejection fraction, by estimation, is 65 to 70%. The left ventricle has hyperdynamic function. The left ventricle has no regional wall motion abnormalities. Left ventricular diastolic parameters are consistent with Grade I diastolic  dysfunction (impaired relaxation).  2. Mildly D-shaped interventricular septum suggests a degree of RV pressure/volume overload. Right ventricular systolic function is normal. The right ventricular size is normal. Tricuspid regurgitation signal is inadequate for assessing PA pressure.  3. The mitral valve is normal in structure. No evidence of mitral valve regurgitation. No evidence of mitral stenosis.  4. The aortic valve is tricuspid. There is mild calcification of the aortic valve. Aortic valve regurgitation is not visualized. No aortic stenosis is present.  5. Aortic dilatation noted. 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  6. The inferior vena cava is dilated in size with >50% respiratory variability, suggesting right atrial pressure of 8 mmHg.  FINDINGS  Left Ventricle: Left ventricular ejection fraction, by estimation, is 65 to 70%. The left ventricle has hyperdynamic function. The left  ventricle has no regional wall motion abnormalities. Definity contrast agent was given IV to delineate the left  ventricular endocardial borders. The left ventricular internal cavity size was normal in size. There is no left ventricular hypertrophy. Left ventricular diastolic parameters are consistent with Grade I diastolic dysfunction (impaired relaxation).  Right Ventricle: Mildly D-shaped interventricular  septum suggests a degree of RV pressure/volume overload. The right ventricular size is normal. No increase in right ventricular wall thickness. Right ventricular systolic function is normal. Tricuspid  regurgitation signal is inadequate for assessing PA pressure.  Left Atrium: Left atrial size was normal in size.  Right Atrium: Right atrial size was normal in size.  Pericardium: There is no evidence of pericardial effusion.  Mitral Valve: The mitral valve is normal in structure. Mild mitral annular calcification. No evidence of mitral valve regurgitation. No evidence of mitral valve stenosis.  Tricuspid Valve: The tricuspid valve is normal in structure. Tricuspid valve regurgitation is not demonstrated.  Aortic Valve: The aortic valve is tricuspid. There is mild calcification of the aortic valve. Aortic valve regurgitation is not visualized. No aortic stenosis is present. Aortic valve peak gradient measures 11.3 mmHg.  Pulmonic Valve: The pulmonic valve was not well visualized. Pulmonic valve regurgitation is not visualized.  Aorta: Aortic dilatation noted. 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  Venous: The inferior vena cava is dilated in size with greater than 50% respiratory variability, suggesting right atrial pressure of 8 mmHg.  IAS/Shunts: No atrial level shunt detected by color flow Doppler.    LEFT VENTRICLE PLAX 2D LVIDd:         3.30 cm   Diastology LVIDs:         2.20 cm   LV e' medial:    8.55 cm/s LV PW:         1.30 cm   LV E/e' medial:  8.6 LV IVS:        1.70 cm   LV e' lateral:   9.17 cm/s LVOT diam:     2.30 cm   LV E/e' lateral: 8.0 LV SV:         85 LV SV Index:   37 LVOT Area:     4.15 cm    RIGHT VENTRICLE             IVC RV S prime:     16.50 cm/s  IVC diam: 2.10 cm  LEFT ATRIUM           Index        RIGHT ATRIUM           Index LA diam:      4.50 cm 1.97 cm/m    RA Area:     20.60 cm LA Vol (A2C): 46.9 ml 20.48 ml/m  RA Volume:   56.90 ml  24.85 ml/m LA Vol (A4C): 49.2 ml 21.49 ml/m  AORTIC VALVE                 PULMONIC VALVE AV Area (Vmax): 3.19 cm     PR End Diast Vel: 10.50 msec AV Vmax:        168.00 cm/s AV Peak Grad:   11.3 mmHg LVOT Vmax:      129.00 cm/s LVOT Vmean:     82.433 cm/s LVOT VTI:       0.205 m   AORTA Ao Root diam: 4.00 cm Ao Asc diam:  4.10 cm  MITRAL VALVE MV Area (PHT): 4.21 cm     SHUNTS MV Decel Time: 180 msec     Systemic VTI:  0.20 m MV E velocity: 73.60 cm/s   Systemic Diam: 2.30 cm MV A velocity: 115.00 cm/s MV E/A ratio:  0.64  Dalton McleanMD Electronically signed by Wilfred Lacy Signature Date/Time: 06/04/2023/4:48:39 PM      Final (Updated)      Assessment & Plan:    See Problem List for Assessment and Plan of chronic medical problems.

## 2023-06-11 NOTE — Telephone Encounter (Signed)
LMOVM (ok per DPR) for patient to offer appointment with Dr. Izora Ribas on 06/25/23 at 11:40am. Main office number provided for call back to confirm.  Spoke with pt's niece, Jearld Lesch (ok per DPR). Denese provides transportation for pt and will not be available between 06/18/23-07/03/23. She reports she also understood that pt would need to see a HF specialist based on conversation with ED provider at discharge. Advised I will reach out to Driscilla Moats, HF Program Coordinator, with this information. Denese in agreement with plan and is aware to expect a call tomorrow with an update.

## 2023-06-11 NOTE — Patient Instructions (Addendum)
      Blood work was ordered.   Have this done at the [facility] lab    Medications changes include :   none      Return for follow up as scheduled.

## 2023-06-12 ENCOUNTER — Telehealth (HOSPITAL_COMMUNITY): Payer: Self-pay | Admitting: Surgery

## 2023-06-12 ENCOUNTER — Ambulatory Visit (INDEPENDENT_AMBULATORY_CARE_PROVIDER_SITE_OTHER): Payer: Medicare HMO | Admitting: Internal Medicine

## 2023-06-12 VITALS — BP 120/76 | HR 72 | Temp 98.4°F | Ht 66.0 in | Wt 264.0 lb

## 2023-06-12 DIAGNOSIS — I5032 Chronic diastolic (congestive) heart failure: Secondary | ICD-10-CM

## 2023-06-12 DIAGNOSIS — R7303 Prediabetes: Secondary | ICD-10-CM

## 2023-06-12 DIAGNOSIS — Z09 Encounter for follow-up examination after completed treatment for conditions other than malignant neoplasm: Secondary | ICD-10-CM

## 2023-06-12 DIAGNOSIS — R748 Abnormal levels of other serum enzymes: Secondary | ICD-10-CM | POA: Diagnosis not present

## 2023-06-12 DIAGNOSIS — R600 Localized edema: Secondary | ICD-10-CM | POA: Diagnosis not present

## 2023-06-12 DIAGNOSIS — I10 Essential (primary) hypertension: Secondary | ICD-10-CM

## 2023-06-12 DIAGNOSIS — Z8619 Personal history of other infectious and parasitic diseases: Secondary | ICD-10-CM

## 2023-06-12 DIAGNOSIS — G4733 Obstructive sleep apnea (adult) (pediatric): Secondary | ICD-10-CM | POA: Diagnosis not present

## 2023-06-12 DIAGNOSIS — A09 Infectious gastroenteritis and colitis, unspecified: Secondary | ICD-10-CM

## 2023-06-12 NOTE — Telephone Encounter (Signed)
I received a request to contact patient's niece- regarding follow-up from his recent hospitalization.  She is concerned about his ongoing swelling in lower extremities and depression.  I have discussed with Dr. Shirlee Latch and he is agreeable to see patient in clinic to evaluate his heart failure.  Appointment scheduled for August 29th at 10:20AM.

## 2023-06-13 NOTE — Assessment & Plan Note (Signed)
Chronic Blood pressure well controlled Continue metoprolol 12.5 mg twice daily 

## 2023-06-13 NOTE — Assessment & Plan Note (Signed)
Chronic Lab Results  Component Value Date   HGBA1C 6.0 01/29/2023    Low sugar / carb diet Stressed regular exercise

## 2023-06-13 NOTE — Assessment & Plan Note (Addendum)
Chronic Improved Continue weight loss efforts Continue low-sodium diet Continue increasing exercise Will get better compression socks and start wearing daily Continue Lasix 20 mg daily

## 2023-06-13 NOTE — Assessment & Plan Note (Signed)
Chronic Appears euvolemic Continue Lasix 20 mg daily Stressed to elevating legs, low-sodium diet, regular exercise and weight loss

## 2023-06-13 NOTE — Assessment & Plan Note (Signed)
Acute Related to Campylobacter infection Resolved with azithromycin

## 2023-06-13 NOTE — Assessment & Plan Note (Signed)
Chronic Has not been compliant with CPAP-May use it for couple of hours at night Has been referred to pulmonary-will need new machine, probably new sleep study and monitoring compliance Stressed the importance of compliance with using his CPAP nightly and even when he takes naps during the day

## 2023-06-13 NOTE — Assessment & Plan Note (Signed)
Chronic Wt Readings from Last 3 Encounters:  06/12/23 264 lb (119.7 kg)  06/06/23 269 lb 3.2 oz (122.1 kg)  05/01/23 276 lb (125.2 kg)   Was diuresed in the hospital. Has changed his diet with the help of family and has lost some weight related to that-strict low-sodium, low sugar diet To start PT Stressed increasing exercise and continue weight loss efforts

## 2023-06-15 ENCOUNTER — Telehealth: Payer: Self-pay | Admitting: Internal Medicine

## 2023-06-15 ENCOUNTER — Other Ambulatory Visit (INDEPENDENT_AMBULATORY_CARE_PROVIDER_SITE_OTHER): Payer: Medicare HMO

## 2023-06-15 ENCOUNTER — Telehealth: Payer: Self-pay

## 2023-06-15 DIAGNOSIS — R748 Abnormal levels of other serum enzymes: Secondary | ICD-10-CM

## 2023-06-15 DIAGNOSIS — I10 Essential (primary) hypertension: Secondary | ICD-10-CM | POA: Diagnosis not present

## 2023-06-15 DIAGNOSIS — R34 Anuria and oliguria: Secondary | ICD-10-CM

## 2023-06-15 DIAGNOSIS — I5032 Chronic diastolic (congestive) heart failure: Secondary | ICD-10-CM

## 2023-06-15 DIAGNOSIS — N1832 Chronic kidney disease, stage 3b: Secondary | ICD-10-CM

## 2023-06-15 DIAGNOSIS — R7303 Prediabetes: Secondary | ICD-10-CM

## 2023-06-15 LAB — CBC WITH DIFFERENTIAL/PLATELET
Basophils Absolute: 0.1 10*3/uL (ref 0.0–0.1)
Basophils Relative: 1.3 % (ref 0.0–3.0)
Eosinophils Absolute: 0.1 10*3/uL (ref 0.0–0.7)
Eosinophils Relative: 1 % (ref 0.0–5.0)
HCT: 55 % — ABNORMAL HIGH (ref 39.0–52.0)
Hemoglobin: 18.2 g/dL (ref 13.0–17.0)
Lymphocytes Relative: 24.3 % (ref 12.0–46.0)
Lymphs Abs: 2.5 10*3/uL (ref 0.7–4.0)
MCHC: 33.1 g/dL (ref 30.0–36.0)
MCV: 90 fl (ref 78.0–100.0)
Monocytes Absolute: 0.7 10*3/uL (ref 0.1–1.0)
Monocytes Relative: 6.8 % (ref 3.0–12.0)
Neutro Abs: 6.8 10*3/uL (ref 1.4–7.7)
Neutrophils Relative %: 66.6 % (ref 43.0–77.0)
Platelets: 153 10*3/uL (ref 150.0–400.0)
RBC: 6.12 Mil/uL — ABNORMAL HIGH (ref 4.22–5.81)
RDW: 14.5 % (ref 11.5–15.5)
WBC: 10.2 10*3/uL (ref 4.0–10.5)

## 2023-06-15 LAB — BASIC METABOLIC PANEL
BUN: 24 mg/dL — ABNORMAL HIGH (ref 6–23)
CO2: 29 mEq/L (ref 19–32)
Calcium: 9.5 mg/dL (ref 8.4–10.5)
Chloride: 100 mEq/L (ref 96–112)
Creatinine, Ser: 1.71 mg/dL — ABNORMAL HIGH (ref 0.40–1.50)
GFR: 37.93 mL/min — ABNORMAL LOW (ref >60.00)
Glucose, Bld: 115 mg/dL — ABNORMAL HIGH (ref 70–99)
Potassium: 4.9 mEq/L (ref 3.5–5.1)
Sodium: 137 mEq/L (ref 135–145)

## 2023-06-15 LAB — CK: Total CK: 62 U/L (ref 7–232)

## 2023-06-15 NOTE — Telephone Encounter (Signed)
Noted-likely secondary to noncompliance with CPAP

## 2023-06-15 NOTE — Telephone Encounter (Signed)
Spoke with [employer] today.

## 2023-06-15 NOTE — Telephone Encounter (Signed)
Patient called returning a call from this number - thinks it might be about labs - please call patient back - [phone]

## 2023-06-17 ENCOUNTER — Other Ambulatory Visit: Payer: Self-pay | Admitting: Gastroenterology

## 2023-06-19 ENCOUNTER — Ambulatory Visit: Admission: RE | Admit: 2023-06-19 | Payer: Medicare HMO | Source: Ambulatory Visit

## 2023-06-19 ENCOUNTER — Telehealth: Payer: Self-pay

## 2023-06-19 ENCOUNTER — Other Ambulatory Visit (INDEPENDENT_AMBULATORY_CARE_PROVIDER_SITE_OTHER): Payer: Medicare HMO

## 2023-06-19 DIAGNOSIS — I5032 Chronic diastolic (congestive) heart failure: Secondary | ICD-10-CM

## 2023-06-19 DIAGNOSIS — I10 Essential (primary) hypertension: Secondary | ICD-10-CM

## 2023-06-19 DIAGNOSIS — R34 Anuria and oliguria: Secondary | ICD-10-CM | POA: Diagnosis not present

## 2023-06-19 DIAGNOSIS — R7303 Prediabetes: Secondary | ICD-10-CM

## 2023-06-19 DIAGNOSIS — N1832 Chronic kidney disease, stage 3b: Secondary | ICD-10-CM

## 2023-06-19 LAB — BRAIN NATRIURETIC PEPTIDE: Pro B Natriuretic peptide (BNP): 56 pg/mL (ref 0.0–100.0)

## 2023-06-19 LAB — CBC WITH DIFFERENTIAL/PLATELET
Basophils Absolute: 0 10*3/uL (ref 0.0–0.1)
Basophils Relative: 0.6 % (ref 0.0–3.0)
Eosinophils Absolute: 0.1 10*3/uL (ref 0.0–0.7)
Eosinophils Relative: 1.3 % (ref 0.0–5.0)
HCT: 54.9 % — ABNORMAL HIGH (ref 39.0–52.0)
Hemoglobin: 17.9 g/dL — ABNORMAL HIGH (ref 13.0–17.0)
Lymphocytes Relative: 27.6 % (ref 12.0–46.0)
Lymphs Abs: 2.1 10*3/uL (ref 0.7–4.0)
MCHC: 32.6 g/dL (ref 30.0–36.0)
MCV: 90.6 fl (ref 78.0–100.0)
Monocytes Absolute: 0.7 10*3/uL (ref 0.1–1.0)
Monocytes Relative: 8.9 % (ref 3.0–12.0)
Neutro Abs: 4.8 10*3/uL (ref 1.4–7.7)
Neutrophils Relative %: 61.6 % (ref 43.0–77.0)
Platelets: 138 10*3/uL — ABNORMAL LOW (ref 150.0–400.0)
RBC: 6.06 Mil/uL — ABNORMAL HIGH (ref 4.22–5.81)
RDW: 14.3 % (ref 11.5–15.5)
WBC: 7.7 10*3/uL (ref 4.0–10.5)

## 2023-06-19 LAB — BASIC METABOLIC PANEL
BUN: 24 mg/dL — ABNORMAL HIGH (ref 6–23)
CO2: 31 mEq/L (ref 19–32)
Calcium: 9.5 mg/dL (ref 8.4–10.5)
Chloride: 96 mEq/L (ref 96–112)
Creatinine, Ser: 1.49 mg/dL (ref 0.40–1.50)
GFR: 44.74 mL/min — ABNORMAL LOW (ref >60.00)
Glucose, Bld: 107 mg/dL — ABNORMAL HIGH (ref 70–99)
Potassium: 4.8 mEq/L (ref 3.5–5.1)
Sodium: 132 mEq/L — ABNORMAL LOW (ref 135–145)

## 2023-06-19 LAB — HEMOGLOBIN A1C: Hgb A1c MFr Bld: 6.2 % (ref 4.6–6.5)

## 2023-06-19 NOTE — Telephone Encounter (Signed)
Reviewed blood work-stable

## 2023-06-23 ENCOUNTER — Inpatient Hospital Stay: Payer: Medicare HMO | Admitting: Internal Medicine

## 2023-06-25 ENCOUNTER — Encounter (INDEPENDENT_AMBULATORY_CARE_PROVIDER_SITE_OTHER): Payer: Self-pay

## 2023-07-06 ENCOUNTER — Ambulatory Visit: Payer: Medicare HMO | Admitting: Neurology

## 2023-07-06 ENCOUNTER — Encounter: Payer: Self-pay | Admitting: Neurology

## 2023-07-06 VITALS — BP 118/68 | HR 67 | Ht 66.0 in | Wt 259.0 lb

## 2023-07-06 DIAGNOSIS — R413 Other amnesia: Secondary | ICD-10-CM | POA: Diagnosis not present

## 2023-07-06 NOTE — Patient Instructions (Signed)
We will order MRI brain   You have been referred for a neurocognitive evaluation (i.e., evaluation of memory and thinking abilities). Please bring someone with you to this appointment if possible, as it is helpful for the neuropsychologist to hear from both you and another adult who knows you well. Please bring eyeglasses and hearing aids if you wear them and take any medications as you normally would. Please fully abstain from all alcohol, marijuana, or other substances prior to your appointment.   The evaluation will take approximately 2-3 hours and has two parts:   The first part is a clinical interview with the neuropsychologist, Dr. Milbert Coulter.  During the interview, the neuropsychologist will speak with you and the individual you brought to the appointment.    The second part of the evaluation is testing with the doctor's technician, aka psychometrician, Annabelle Harman or [employer]. During the testing, the technician will ask you to remember different types of material, solve problems, and answer some questionnaires. Your family member will not be present for this portion of the evaluation.   Please note: We have to reserve several hours of the neuropsychologist's time and the psychometrician's time for your evaluation appointment. As such, there is a No-Show fee of $100. If you are unable to attend any of your appointments, please contact our office as soon as possible to reschedule.

## 2023-07-06 NOTE — Progress Notes (Unsigned)
Follow-up Visit   Date: 07/06/2023    Justin Padilla MRN: [id number] DOB: May 22, 1946    Justin Padilla is a 77 y.o. right-handed Caucasian male with hypertension, GERD, OSA, and CHF returning to the clinic with new complaints of memory changes.  The patient was accompanied to the clinic by niece and her husband who also provides collateral information.    IMPRESSION/PLAN: Cognitive impairment, predominately amnestic.  He scored 22/30 on MOCA (-2 visuospatial, -1 abstraction, -5 delayed recall).  He is able to keep up with most IADLs, except for meals. To further evaluate the nature of his memory changes, I recommend MRI brain wo contrast and formal neuropsychological testing.  From a neurological standpoint, I would suggest that someone observes driving skills prior to allowing him to drive independently.  Alternatively, formal driving assessment is another option.  Return to clinic after testing  --------------------------------------------- History of present illness: Starting around 2022, he began having tingling in the balls of the feet, which gradually extended into toes, feet, and lower legs.  Symptoms are constant.  Late in 2023, he started having numbness in the hands, especially left hand.  He has noticed that leaning on the left elbow causes worsening tingling in the hand.     He also has burning in the left lateral thigh which has been ongoing for the past few years.  Symptoms are intermittent.    He stopped working in 2006 due to right knee problems.  He was drinking liquor about 4-5 times per week for about 6 years when he was working.   UPDATE 07/06/2023:  He is accompanied by her niece and her husband.  Niece states that for the past year, she has noticed that he is forgetful with details of conversation.  She has noticed more short-term memory loss.  He is unable to multitask, where as previously he was able to keep up with multiple things.  Niece is preparing  meals. He manages his finances and medications.  There was acute worsening of confusion in July after he had a fall and was hospitalized with GI illness and AKI.  Symptoms have returned to baseline.  Since his hospitalization, he is no longer driving.  He was driving prior to this and there were no safety concerns.  He was not getting lost or wandering.   Medications:  Current Outpatient Medications on File Prior to Visit  Medication Sig Dispense Refill   acetaminophen (TYLENOL) 500 MG tablet Take 1,000 mg by mouth as needed for moderate pain.     albuterol (VENTOLIN HFA) 108 (90 Base) MCG/ACT inhaler Inhale 2 puffs into the lungs as needed for wheezing or shortness of breath.     Fluticasone-Umeclidin-Vilant (TRELEGY ELLIPTA) 100-62.5-25 MCG/ACT AEPB Inhale 1 puff into the lungs daily. 1 each 11   furosemide (LASIX) 20 MG tablet Take 1 tablet (20 mg total) by mouth daily. 30 tablet 0   metoprolol tartrate (LOPRESSOR) 25 MG tablet Take 0.5 tablets (12.5 mg total) by mouth 2 (two) times daily. 60 tablet 0   Multiple Vitamins-Minerals (CENTRUM SILVER PO) Take 1 tablet by mouth daily.     omeprazole (PRILOSEC) 20 MG capsule Take 1 capsule (20 mg total) by mouth daily. 90 capsule 3   No current facility-administered medications on file prior to visit.    Allergies:  Allergies  Allergen Reactions   Trazodone And Nefazodone Other (See Comments)    Dry mouth    Vital Signs:  Ht 5\' 6"  (1.676 m)  BMI 42.61 kg/m   Neurological Exam: MENTAL STATUS including orientation to time, place, person, recent and remote memory, attention span and concentration, language, and fund of knowledge is normal.  Speech is not dysarthric.    07/06/2023   11:58 AM  Montreal Cognitive Assessment   Visuospatial/ Executive (0/5) 3  Naming (0/3) 3  Attention: Read list of digits (0/2) 2  Attention: Read list of letters (0/1) 1  Attention: Serial 7 subtraction starting at 100 (0/3) 3  Language: Repeat phrase  (0/2) 2  Language : Fluency (0/1) 1  Abstraction (0/2) 0  Delayed Recall (0/5) 0  Orientation (0/6) 6  Total 21  Adjusted Score (based on education) 22    CRANIAL NERVES:  Normal conjugate, extra-ocular eye movements in all directions of gaze.  No ptosis.  Face is symmetric.  MOTOR:  Motor strength is 5/5 in all extremities.  No atrophy, fasciculations or abnormal movements.  No pronator drift.  Tone is normal.    COORDINATION/GAIT:    Gait narrow based and stable.   Data: Lab Results  Component Value Date   TSH 2.211 06/06/2023   Lab Results  Component Value Date   VITAMINB12 435 06/05/2023   Lab Results  Component Value Date   CKTOTAL 62 06/15/2023   CT head wo contrast 06/04/2023:  Negative  Total time spent reviewing records, interview, history/exam, documentation, and coordination of care on day of encounter:  40 min    Thank you for allowing me to participate in patient's care.  If I can answer any additional questions, I would be pleased to do so.    Sincerely,    Laurier Jasperson K. Allena Katz, DO

## 2023-07-07 ENCOUNTER — Encounter (HOSPITAL_BASED_OUTPATIENT_CLINIC_OR_DEPARTMENT_OTHER): Payer: Self-pay | Admitting: Physical Therapy

## 2023-07-07 ENCOUNTER — Ambulatory Visit (HOSPITAL_BASED_OUTPATIENT_CLINIC_OR_DEPARTMENT_OTHER): Payer: Medicare HMO | Attending: Internal Medicine | Admitting: Physical Therapy

## 2023-07-07 DIAGNOSIS — M6281 Muscle weakness (generalized): Secondary | ICD-10-CM | POA: Insufficient documentation

## 2023-07-07 DIAGNOSIS — N179 Acute kidney failure, unspecified: Secondary | ICD-10-CM | POA: Insufficient documentation

## 2023-07-07 DIAGNOSIS — R2689 Other abnormalities of gait and mobility: Secondary | ICD-10-CM | POA: Insufficient documentation

## 2023-07-07 DIAGNOSIS — R531 Weakness: Secondary | ICD-10-CM | POA: Insufficient documentation
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
## 2023-07-09 ENCOUNTER — Other Ambulatory Visit (HOSPITAL_COMMUNITY): Payer: Self-pay

## 2023-07-09 ENCOUNTER — Encounter (HOSPITAL_COMMUNITY): Payer: Self-pay | Admitting: Cardiology

## 2023-07-09 ENCOUNTER — Ambulatory Visit (HOSPITAL_COMMUNITY)
Admission: RE | Admit: 2023-07-09 | Discharge: 2023-07-09 | Disposition: A | Discharge status: Home / Self Care | Payer: Medicare HMO | Source: Ambulatory Visit | Attending: Cardiology | Admitting: Cardiology

## 2023-07-09 VITALS — BP 122/69 | HR 85 | Wt 259.6 lb

## 2023-07-09 DIAGNOSIS — N183 Chronic kidney disease, stage 3 unspecified: Secondary | ICD-10-CM | POA: Diagnosis not present

## 2023-07-09 DIAGNOSIS — R7303 Prediabetes: Secondary | ICD-10-CM | POA: Diagnosis not present

## 2023-07-09 DIAGNOSIS — I5032 Chronic diastolic (congestive) heart failure: Secondary | ICD-10-CM

## 2023-07-09 DIAGNOSIS — I10 Essential (primary) hypertension: Secondary | ICD-10-CM

## 2023-07-09 DIAGNOSIS — R0609 Other forms of dyspnea: Secondary | ICD-10-CM | POA: Diagnosis not present

## 2023-07-09 LAB — BASIC METABOLIC PANEL
Anion gap: 9 (ref 5–15)
BUN: 25 mg/dL — ABNORMAL HIGH (ref 8–23)
CO2: 25 mmol/L (ref 22–32)
Calcium: 8.8 mg/dL — ABNORMAL LOW (ref 8.9–10.3)
Chloride: 103 mmol/L (ref 98–111)
Creatinine, Ser: 1.63 mg/dL — ABNORMAL HIGH (ref 0.61–1.24)
GFR, Estimated: 43 mL/min — ABNORMAL LOW (ref >60)
Glucose, Bld: 94 mg/dL (ref 70–99)
Potassium: 5.4 mmol/L — ABNORMAL HIGH (ref 3.5–5.1)
Sodium: 137 mmol/L (ref 135–145)

## 2023-07-09 LAB — LIPID PANEL
Cholesterol: 103 mg/dL (ref 0–200)
HDL: 36 mg/dL — ABNORMAL LOW (ref >40)
LDL Cholesterol: 55 mg/dL (ref 0–99)
Total CHOL/HDL Ratio: 2.9 RATIO
Triglycerides: 62 mg/dL (ref <150)
VLDL: 12 mg/dL (ref 0–40)

## 2023-07-09 LAB — HEMOGLOBIN A1C
Hgb A1c MFr Bld: 6 % — ABNORMAL HIGH (ref 4.8–5.6)
Mean Plasma Glucose: 125.5 mg/dL

## 2023-07-09 LAB — BRAIN NATRIURETIC PEPTIDE: B Natriuretic Peptide: 12.5 pg/mL (ref 0.0–100.0)

## 2023-07-09 MED ORDER — DAPAGLIFLOZIN PROPANEDIOL 10 MG PO TABS: 10.0000 mg | ORAL_TABLET | Freq: Every day | ORAL | 6 refills | Status: DC

## 2023-07-09 NOTE — Progress Notes (Signed)
ReDS Vest / Clip - 07/09/23 1100       ReDS Vest / Clip   Station Marker D    Ruler Value 35    ReDS Value Range Moderate volume overload    ReDS Actual Value 38

## 2023-07-09 NOTE — Patient Instructions (Addendum)
Medication Changes:  START Farxiga 10 mg Daily  Lab Work:  Labs done today, your results will be available in MyChart, we will contact you for abnormal readings.  Your physician recommends that you return for lab work in: 1-2 weeks  Testing/Procedures:  Cardiac PET Scan has been ordered, please see instructions below, you will be called to schedule this  Referrals:  none  Special Instructions // Education:  Do the following things EVERYDAY: Weigh yourself in the morning before breakfast. Write it down and keep it in a log. Take your medicines as prescribed Eat low salt foods--Limit salt (sodium) to 2000 mg per day.  Stay as active as you can everyday Limit all fluids for the day to less than 2 liters  How to Prepare for Your Cardiac PET/CT Stress Test:  1. Please do not take these medications before your test:   Metoprolol Tartrate Farxiga  2. Nothing to eat or drink, except water, 3 hours prior to arrival time.   NO caffeine/decaffeinated products, or chocolate 12 hours prior to arrival.  3. NO perfume, cologne or lotion  4. Total time is 1 to 2 hours; you may want to bring reading material for the waiting time.  5. Please report to Radiology at the [facility] Main Entr[facility] 30 minutes early for your test.  [address] [place], [place] [address]  In preparation for your appointment, medication and supplies will be purchased.  Appointment availability is limited, so if you need to cancel or reschedule, please call the Radiology Department at [phone] [facility]) OR [phone] [facility])  24 hours in advance to avoid a cancellation fee of $100.00  What to Expect After you Arrive:  Once you arrive and check in for your appointment, you will be taken to a preparation room within the Radiology Department.  A technologist or Nurse will obtain your medical history, verify that you are correctly prepped for the exam, and explain the procedure.  Afterwards,   an IV will be started in your arm and electrodes will be placed on your skin for EKG monitoring during the stress portion of the exam. Then you will be escorted to the PET/CT scanner.  There, staff will get you positioned on the scanner and obtain a blood pressure and EKG.  During the exam, you will continue to be connected to the EKG and blood pressure machines.  A small, safe amount of a radioactive tracer will be injected in your IV to obtain a series of pictures of your heart along with an injection of a stress agent.    After your Exam:  It is recommended that you eat a meal and drink a caffeinated beverage to counter act any effects of the stress agent.  Drink plenty of fluids for the remainder of the day and urinate frequently for the first couple of hours after the exam.  Your doctor will inform you of your test results within 7-10 business days.  For more information and frequently asked questions, please visit our website : [email or web address]  For questions about your test or how to prepare for your test, please call: Cardiac Imaging Nurse Navigators Office: [phone]   Follow-Up in: 2 months   At the [facility], you and your health needs are our priority. We have a designated team specialized in the treatment of Heart Failure. This Care Team includes your primary Heart Failure Specialized Cardiologist (physician), Advanced Practice Providers (APPs- Physician Assistants and Nurse Practitioners), and Pharmacist who  all work together to provide you with the care you need, when you need it.   You may see any of the following providers on your designated Care Team at your next follow up:  Dr. Arvilla Meres Dr. Marca Ancona Dr. Marcos Eke, NP Robbie Lis, [place] [facility] [place], [place] Brynda Peon, NP Karle Plumber, PharmD   Please be sure to bring in all your medications bottles to every appointment.   Need to  Contact us:  If you have any questions or concerns before your next appointment please send [place] a message through [place] or call our office at [phone].    TO LEAVE A MESSAGE FOR THE NURSE SELECT OPTION 2, PLEASE LEAVE A MESSAGE INCLUDING: YOUR NAME DATE OF BIRTH CALL BACK NUMBER REASON FOR CALL**this is important as we prioritize the call backs  YOU WILL RECEIVE A CALL BACK THE SAME DAY AS LONG AS YOU CALL BEFORE 4:00 PM

## 2023-07-10 ENCOUNTER — Telehealth (HOSPITAL_COMMUNITY): Payer: Self-pay

## 2023-07-10 NOTE — Telephone Encounter (Signed)
Spoke with patients niece okay per DPR and advised her of the below. She will adjust patients diet. Patient scheduled for repeat BMET next week.

## 2023-07-10 NOTE — Telephone Encounter (Signed)
-----   Message from Marca Ancona sent at 07/09/2023  9:15 PM EDT ----- K is elevated at 5.4.  Would follow low K diet, cut out any K supplement. BMET 10 days.

## 2023-07-12 DIAGNOSIS — R69 Illness, unspecified: Secondary | ICD-10-CM | POA: Diagnosis not present

## 2023-07-13 NOTE — Progress Notes (Signed)
PCP: Justin Sanes, MD HF Cardiology: Dr. Shirlee Padilla  77 y.o. with history of HTN, OSA, and peripheral edema was referred by Dr. Lawerance Padilla for evaluation of CHF.  Earlier this year, he began to note significant peripheral edema. He also noted dyspnea with moderate exertion.  He was started on Lasix 20 mg daily by his PCP.  He has continued to have peripheral edema with some improvement with addition of compression stockings.  Creatinine has been elevated since starting Lasix, 1.63 today.  Patient reports dyspnea with walking longer distances or going up stairs.  No orthopnea/PND.  Occasional atypical chest pain.  No lightheadedness.    REDS clip 38%  ECG (personally reviewed):  NSR, 1st degree AVB, RBBB  Labs (8/24): LDL 55, BNP 146 => 12.5, K 5.4, creatinine 1.63  PMH: 1. GERD 2. HTN 3. OSA: uses CPAP 4. Chronic diastolic CHF: Echo (7/24) with EF 65-70%, normal RV size and systolic function, no MR, no aortic stenosis.  5. H/o lung nodules.  6. Dilated ascending aorta: 4.1 cm on 6/24 CT chest.  7. CKD stage 3  Social History   Socioeconomic History   Marital status: Divorced    Spouse name: Not on file   Number of children: 1   Years of education: 14   Highest education level: Not on file  Occupational History   Not on file  Tobacco Use   Smoking status: Former    Current packs/day: 0.00    Types: Cigarettes    Start date: 05/1963    Quit date: 05/2008    Years since quitting: 15.1   Smokeless tobacco: Never   Tobacco comments:    quit in 2009  Vaping Use   Vaping status: Never Used  Substance and Sexual Activity   Alcohol use: Not Currently    Alcohol/week: 1.0 standard drink of alcohol    Types: 1 Cans of beer per week    Comment: every month or 2 months.    Drug use: Not Currently    Comment: Marijuana long time ago it was occassional smoking,   Sexual activity: Not Currently  Other Topics Concern   Not on file  Social History Narrative   Diet:  No      Do you  drink/ eat things with caffeine? Yes      Marital status:      Single                         What year were you married ? 1967      Do you live in a house, apartment,assistred living, condo, trailer, etc.)? House      Is it one or more stories? No      How many persons live in your home ?  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  No                            Type & how often       ADVANCED DIRECTIVES (Please bring copies)      Do you have a living will? Yes      Do you have a  DNR form?   Yes                    If not, do you want to discuss one?       Do you have signed POA?HPOA forms?   Yes              If so, please bring to your appointment      FUNCTIONAL STATUS- To be completed by Spouse / child / Staff       Do you have difficulty bathing or dressing yourself ? No      Do you have difficulty preparing food or eating ? No      Do you have difficulty managing your mediation ? No      Do you have difficulty managing your finances ? No      Do you have difficulty affording your medication ? No            Right Handed    Lives in a one story home alone       Social Determinants of Health   Financial Resource Strain: Low Risk  (08/21/2022)   Overall Financial Resource Strain (CARDIA)    Difficulty of Paying Living Expenses: Not hard at all  Food Insecurity: No Food Insecurity (06/09/2023)   Hunger Vital Sign    Worried About Running Out of Food in the Last Year: Never true    Ran Out of Food in the Last Year: Never true  Transportation Needs: No Transportation Needs (06/09/2023)   PRAPARE - Administrator, Civil Service (Medical): No    Lack of Transportation (Non-Medical): No  Physical Activity: Sufficiently Active (08/21/2022)   Exercise Vital Sign    Days of Exercise per Week: 3 days     Minutes of Exercise per Session: 130 min  Stress: No Stress Concern Present (08/21/2022)   [name] of Occupational Health - Occupational Stress Questionnaire    Feeling of Stress : Only a little  Social Connections: Moderately Isolated (08/21/2022)   Social Connection and Isolation Panel [NHANES]    Frequency of Communication with Friends and Family: More than three times a week    Frequency of Social Gatherings with Friends and Family: Twice a week    Attends Religious Services: Never    Database administrator or Organizations: Yes    Attends Engineer, structural: More than 4 times per year    Marital Status: Divorced  Intimate Partner Violence: Not At Risk (08/21/2022)   Humiliation, Afraid, Rape, and Kick questionnaire    Fear of Current or Ex-Partner: No    Emotionally Abused: No    Physically Abused: No    Sexually Abused: No   Family History  Problem Relation Age of Onset   Hepatitis C Mother 38   Stomach cancer Father 58   Von Willebrand disease Sister    Autoimmune disease Sister    Autoimmune disease Brother 27   Colon polyps Brother    Colon cancer Maternal Grandmother    Anuerysm Son 56   Esophageal cancer Neg Hx    ROS: All systems reviewed and negative except as per HPI.   Current Outpatient Medications  Medication Sig Dispense Refill   acetaminophen (TYLENOL) 500 MG tablet Take 1,000 mg by mouth as needed for moderate pain.     albuterol (VENTOLIN HFA) 108 (90 Base) MCG/ACT inhaler Inhale 2 puffs into the lungs as needed for wheezing or shortness of breath.  dapagliflozin propanediol (FARXIGA) 10 MG TABS tablet Take 1 tablet (10 mg total) by mouth daily before breakfast. 30 tablet 6   Fluticasone-Umeclidin-Vilant (TRELEGY ELLIPTA) 100-62.5-25 MCG/ACT AEPB Inhale 1 puff into the lungs daily. 1 each 11   furosemide (LASIX) 20 MG tablet Take 1 tablet (20 mg total) by mouth daily. 30 tablet 0   metoprolol tartrate (LOPRESSOR) 25 MG tablet  Take 0.5 tablets (12.5 mg total) by mouth 2 (two) times daily. 60 tablet 0   Multiple Vitamins-Minerals (CENTRUM SILVER PO) Take 1 tablet by mouth daily.     omeprazole (PRILOSEC) 20 MG capsule Take 1 capsule (20 mg total) by mouth daily. 90 capsule 3   No current facility-administered medications for this encounter.   BP 122/69   Pulse 85   Wt 117.8 kg (259 lb 9.6 oz)   SpO2 90%   BMI 41.90 kg/m  General: NAD, obese.  Neck: JVP 8 cm with HJR, no thyromegaly or thyroid nodule.  Lungs: Clear to auscultation bilaterally with normal respiratory effort. CV: Nondisplaced PMI.  Heart regular S1/S2, no S3/S4, no murmur.  1+ edema to knees  No carotid bruit.  Normal pedal pulses.  Abdomen: Soft, nontender, no hepatosplenomegaly, no distention.  Skin: Intact without lesions or rashes.  Neurologic: Alert and oriented x 3.  Psych: Normal affect. Extremities: No clubbing or cyanosis.  HEENT: Normal.   Assessment/Plan: 1. Chronic diastolic CHF: Echo in 7/24 showed EF 65-70%, normal RV size and systolic function, no MR, no aortic stenosis. He is mildly volume overloaded on exam and by REDS clip with NYHA class II symptoms.  Volume overload is complicated by CKD stage 3.  - I will continue current Lasix 20 mg daily.  - Add Farxiga 10 mg daily, this should help remove some additional volume.  BMET in 10 days.  - Continue to wear compression stockings.  - With exertional symptoms, I think an ischemic evaluation would be valuable as this has not been done in the past.  Given body habitus/weight, I will arrange for cardiac PET to assess for ischemia.  2. CKD stage 3: Starting [place] today, check BMET in 10 days.  3. HTN: BP is currently controlled.  4. OSA: Continue CPAP.  5. Ascending aortic aneurysm: 4.1 cm (mildly dilated) on 6/24 CT.  6. Obesity: Patient has significant abdominal obesity.  I think that his breathing would improve with weight loss.  - I recommended GLP-1 agonist.  He is going to  consider this.   Followup 2 months with me  Justin Padilla 07/13/2023

## 2023-07-14 ENCOUNTER — Encounter: Payer: Self-pay | Admitting: Pulmonary Disease

## 2023-07-14 ENCOUNTER — Ambulatory Visit: Payer: Medicare HMO | Admitting: Pulmonary Disease

## 2023-07-14 VITALS — BP 104/58 | HR 60 | Ht 71.0 in | Wt 258.0 lb

## 2023-07-14 DIAGNOSIS — R918 Other nonspecific abnormal finding of lung field: Secondary | ICD-10-CM | POA: Diagnosis not present

## 2023-07-14 DIAGNOSIS — G4733 Obstructive sleep apnea (adult) (pediatric): Secondary | ICD-10-CM

## 2023-07-14 NOTE — Progress Notes (Deleted)
Justin Padilla    [id number]    11-21-1945  Primary Care Physician:Burns, Bobette Mo, MD  Referring Physician: Pincus Sanes, MD [address] [place],  [place] [address]  Chief complaint: Follow up for lung nodule  HPI: 77 y.o. with OSA, polycythemia, pulmonary nodules He is moved here recently from [place].  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  Complains of mild dyspnea on exertion, chronic cough with white mucus.  No wheezing or fevers or chills  Pets: No pets Occupation: Used to work in tile, Multimedia programmer, and stone cutting Exposures: Exposed to dust as noted above.  No mold, hot tub, Jacuzzi.  No feather pillows or comforters Smoking history: 40-pack-year smoker.  Quit in 2009 Travel history: Originally from [place].  Previously lived in [place] Relevant family history: No significant family history of lung disease  Interim history:  He is here for follow-up of CT scan.  No respiratory complaints.  He is trying to lose weight with diet and exercise  Outpatient Encounter Medications as of 07/14/2023  Medication Sig   acetaminophen (TYLENOL) 500 MG tablet Take 1,000 mg by mouth as needed for moderate pain.   albuterol (VENTOLIN HFA) 108 (90 Base) MCG/ACT inhaler Inhale 2 puffs into the lungs as needed for wheezing or shortness of breath.   dapagliflozin propanediol (FARXIGA) 10 MG TABS tablet Take 1 tablet (10 mg total) by mouth daily before breakfast.   Fluticasone-Umeclidin-Vilant (TRELEGY ELLIPTA) 100-62.5-25 MCG/ACT AEPB Inhale 1 puff into the lungs daily.   furosemide (LASIX) 20 MG tablet Take 1 tablet (20 mg total) by mouth daily.   metoprolol tartrate (LOPRESSOR) 25 MG tablet Take 0.5 tablets (12.5 mg total) by mouth 2 (two) times daily.   Multiple Vitamins-Minerals (CENTRUM SILVER PO) Take 1 tablet by mouth daily.   omeprazole (PRILOSEC)  20 MG capsule Take 1 capsule (20 mg total) by mouth daily.   No facility-administered encounter medications on file as of 07/14/2023.    Allergies as of 07/14/2023 - Review Complete 07/14/2023  Allergen Reaction Noted   Trazodone and nefazodone Other (See Comments) 01/22/2022    Physical Exam: Blood pressure 124/60, pulse (!) 58, temperature 97.6 F (36.4 C), temperature source Oral, height 5\' 6"  (1.676 m), weight 257 lb 9.6 oz (116.8 kg), SpO2 96 %. Gen:      No acute distress HEENT:  EOMI, sclera anicteric Neck:     No masses; no thyromegaly Lungs:    Clear to auscultation bilaterally; normal respiratory effort CV:         Regular rate and rhythm; no murmurs Abd:      + bowel sounds; soft, non-tender; no palpable masses, no distension Ext:    No edema; adequate peripheral perfusion Skin:      Warm and dry; no rash Neuro: alert and oriented x 3 Psych: normal mood and affect   Data Reviewed: Imaging: CT chest 09/03/2018 report from care everywhere 1. THERE ARE MULTIPLE BILATERAL PULMONARY NODULES. MOST OF THESE ARE STABLE SINCE PRIOR STUDY FROM 11/25/2012. HOWEVER, THERE ARE 2 NEW NODULES. THERE IS A NEW NODULE IN THE LEFT UPPER LOBE MEASURING 4 MM ON IMAGE #22. THERE IS A NEW NODULE IN THE LINGULA MEASURING 5 MM.  2. SUBCENTIMETER MEDIASTINAL LYMPH NODES.  3. THE THORACIC AORTA IS ECTATIC MEASURING 4.2 CM AT ITS ASCENDING SEGMENT.  CT chest 11/07/2019 report from care everywhere BILATERAL PULMONARY NODULES: SOME ARE STABLE, SOME HAVE RESOLVED, AND SOME ARE NEW. THIS SUGGESTS AN INFLAMMATORY/INFECTIOUS PROCESS. RECOMMEND FOLLOW-UP IN 6 MONTHS.   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  The nodules are tiny and likely benign.  Although he has a history of scoliosis I do not see any evidence of this on his CTs  Follow-up CT noted with stable lung nodules.  Emphysema, ex-smoker No significant obstruction on spirometry.  His PFTs are normal Since he is asymptomatic we will observe off inhalers He is working on weight loss which will help with his breathing  Ascending aortic aneurysm Order follow-up CTA in 1 year  Plan/Recommendations: Follow-up CT in 1 year  Chilton Greathouse MD [facility] 07/14/2023, 11:41 AM  CC: Pincus Sanes, MD

## 2023-07-14 NOTE — Progress Notes (Signed)
Justin Padilla    [id number]    Sep 23, 1946  Primary Care Physician:Burns, Bobette Mo, MD  Referring Physician: Pincus Sanes, MD [address] [place],  [place] [address]  Chief complaint: Follow up for lung nodule  HPI: 77 y.o. with OSA, polycythemia, pulmonary nodules He is moved here recently from [place].  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  Complains of mild dyspnea on exertion, chronic cough with white mucus.  No wheezing or fevers or chills  Pets: No pets Occupation: Used to work in tile, Multimedia programmer, and stone cutting Exposures: Exposed to dust as noted above.  No mold, hot tub, Jacuzzi.  No feather pillows or comforters Smoking history: 40-pack-year smoker.  Quit in 2009 Travel history: Originally from [place].  Previously lived in [place] Relevant family history: No significant family history of lung disease  Interim history:  Seen back in clinic after gap of 1 year States her breathing is doing well.  He stopped using the Trelegy inhaler.  And states that breathing is doing well with no issues.  Continues to have polycythemia.  He has not been compliant with CPAP and will need a reassessment.  Outpatient Encounter Medications as of 07/14/2023  Medication Sig   acetaminophen (TYLENOL) 500 MG tablet Take 1,000 mg by mouth as needed for moderate pain.   albuterol (VENTOLIN HFA) 108 (90 Base) MCG/ACT inhaler Inhale 2 puffs into the lungs as needed for wheezing or shortness of breath.   dapagliflozin propanediol (FARXIGA) 10 MG TABS tablet Take 1 tablet (10 mg total) by mouth daily before breakfast.   Fluticasone-Umeclidin-Vilant (TRELEGY ELLIPTA) 100-62.5-25 MCG/ACT AEPB Inhale 1 puff into the lungs daily.   furosemide (LASIX) 20 MG tablet Take 1 tablet (20 mg total) by mouth daily.   metoprolol tartrate (LOPRESSOR) 25 MG tablet Take 0.5  tablets (12.5 mg total) by mouth 2 (two) times daily.   Multiple Vitamins-Minerals (CENTRUM SILVER PO) Take 1 tablet by mouth daily.   omeprazole (PRILOSEC) 20 MG capsule Take 1 capsule (20 mg total) by mouth daily.   No facility-administered encounter medications on file as of 07/14/2023.    Allergies as of 07/14/2023 - Review Complete 07/14/2023  Allergen Reaction Noted   Trazodone and nefazodone Other (See Comments) 01/22/2022    Physical Exam: Blood pressure 124/60, pulse (!) 58, temperature 97.6 F (36.4 C), temperature source Oral, height 5\' 6"  (1.676 m), weight 257 lb 9.6 oz (116.8 kg), SpO2 96 %. Gen:      No acute distress HEENT:  EOMI, sclera anicteric Neck:     No masses; no thyromegaly Lungs:    Clear to auscultation bilaterally; normal respiratory effort CV:         Regular rate and rhythm; no murmurs Abd:      + bowel sounds; soft, non-tender; no palpable masses, no distension Ext:    No edema; adequate peripheral perfusion Skin:      Warm and dry; no rash Neuro: alert and oriented x 3 Psych: normal mood and affect   Data Reviewed: Imaging: CT chest 09/03/2018 report from care everywhere 1. THERE ARE MULTIPLE BILATERAL PULMONARY NODULES. MOST OF THESE ARE STABLE SINCE PRIOR STUDY FROM 11/25/2012. HOWEVER, THERE ARE 2 NEW NODULES. THERE IS A NEW NODULE IN THE LEFT UPPER LOBE MEASURING 4 MM ON IMAGE #22. THERE IS A NEW  NODULE IN THE LINGULA MEASURING 5 MM.  2. SUBCENTIMETER MEDIASTINAL LYMPH NODES.  3. THE THORACIC AORTA IS ECTATIC MEASURING 4.2 CM AT ITS ASCENDING SEGMENT.    CT chest 11/07/2019 report from care everywhere BILATERAL PULMONARY NODULES: SOME ARE STABLE, SOME HAVE RESOLVED, AND SOME ARE NEW. THIS SUGGESTS AN INFLAMMATORY/INFECTIOUS PROCESS. RECOMMEND FOLLOW-UP IN 6 MONTHS.   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 I reviewed the images personally.   PFTs: 09/19/20 FVC 2.71 [75%], FEV1 2.08 [80%], F/F 77, TLC 5.51 [88%], DLCO 21.84 [98%] No obstruction on spirometry, normal lung volumes and diffusion capacity  Labs:  Assessment:  Lung nodules Per report these were seen back in 2019.  The nodules are tiny and likely benign.  Although he has a history of scoliosis I do not see any evidence of this on his CTs  Follow-up CT noted with stable lung nodules.  Emphysema, ex-smoker No significant obstruction on spirometry.  His PFTs are normal Since he is asymptomatic we will observe off inhalers He is working on weight loss which will help with his breathing  Ascending aortic aneurysm Has been getting annual CTs.  OSA Has been untreated for some time due to noncompliance Will order home sleep study to reevaluate.  Plan/Recommendations: Home sleep study.  Chilton Greathouse MD [facility] 07/14/2023, 11:40 AM  CC: Pincus Sanes, MD

## 2023-07-14 NOTE — Patient Instructions (Signed)
Will observe you off Trelegy inhalers Will order a home sleep study and canceled appointment you have for September 5 Follow-up 3 months

## 2023-07-16 ENCOUNTER — Institutional Professional Consult (permissible substitution): Payer: Medicare HMO | Admitting: Adult Health

## 2023-07-16 ENCOUNTER — Ambulatory Visit (HOSPITAL_COMMUNITY)
Admission: RE | Admit: 2023-07-16 | Discharge: 2023-07-16 | Disposition: A | Discharge status: Home / Self Care | Payer: Medicare HMO | Source: Ambulatory Visit | Attending: Cardiology | Admitting: Cardiology

## 2023-07-16 DIAGNOSIS — I5032 Chronic diastolic (congestive) heart failure: Secondary | ICD-10-CM | POA: Diagnosis not present

## 2023-07-16 LAB — BASIC METABOLIC PANEL WITH GFR
Anion gap: 10 (ref 5–15)
BUN: 16 mg/dL (ref 8–23)
CO2: 26 mmol/L (ref 22–32)
Calcium: 9.1 mg/dL (ref 8.9–10.3)
Chloride: 99 mmol/L (ref 98–111)
Creatinine, Ser: 1.55 mg/dL — ABNORMAL HIGH (ref 0.61–1.24)
GFR, Estimated: 46 mL/min — ABNORMAL LOW
Glucose, Bld: 91 mg/dL (ref 70–99)
Potassium: 4.3 mmol/L (ref 3.5–5.1)
Sodium: 135 mmol/L (ref 135–145)

## 2023-07-20 DIAGNOSIS — G4730 Sleep apnea, unspecified: Secondary | ICD-10-CM | POA: Diagnosis not present

## 2023-07-23 ENCOUNTER — Other Ambulatory Visit (HOSPITAL_COMMUNITY): Payer: Medicare HMO

## 2023-07-27 ENCOUNTER — Encounter: Payer: Self-pay | Admitting: Internal Medicine

## 2023-07-27 NOTE — Progress Notes (Unsigned)
Subjective:    Patient ID: Justin Padilla, male    DOB: 1946/07/17, 77 y.o.   MRN: [id number]     HPI Addison is here for follow up of his chronic medical problems.  Saw pulm - home sleep study, PFTs normal.  Ct lungs in one year.  Observe off inhalers  HF clinic - consider glp-1.  Started farxiga 10 mg daily.  Cardiac PET for ischemia.  Continue lasix 20 mg daily.   Neuro - Dr Allena Katz. MR of brain next month. Referred to neuro-psych  Doing better.  Has rash from working in yard.  Has been using cortisone and it helps.    Medications and allergies reviewed with patient and updated if appropriate.  Current Outpatient Medications on File Prior to Visit  Medication Sig Dispense Refill   acetaminophen (TYLENOL) 500 MG tablet Take 1,000 mg by mouth as needed for moderate pain.     albuterol (VENTOLIN HFA) 108 (90 Base) MCG/ACT inhaler Inhale 2 puffs into the lungs as needed for wheezing or shortness of breath.     dapagliflozin propanediol (FARXIGA) 10 MG TABS tablet Take 1 tablet (10 mg total) by mouth daily before breakfast. 30 tablet 6   Fluticasone-Umeclidin-Vilant (TRELEGY ELLIPTA) 100-62.5-25 MCG/ACT AEPB Inhale 1 puff into the lungs daily. 1 each 11   furosemide (LASIX) 20 MG tablet Take 1 tablet (20 mg total) by mouth daily. 30 tablet 0   metoprolol tartrate (LOPRESSOR) 25 MG tablet Take 0.5 tablets (12.5 mg total) by mouth 2 (two) times daily. 60 tablet 0   Multiple Vitamins-Minerals (CENTRUM SILVER PO) Take 1 tablet by mouth daily.     omeprazole (PRILOSEC) 20 MG capsule Take 1 capsule (20 mg total) by mouth daily. 90 capsule 3   No current facility-administered medications on file prior to visit.     Review of Systems  Respiratory:  Positive for shortness of breath (improved). Negative for cough and wheezing.   Cardiovascular:  Positive for leg swelling. Negative for chest pain and palpitations.  Neurological:  Negative for light-headedness and headaches.        Objective:   Vitals:   07/28/23 1258  BP: 114/78  Pulse: 70  Temp: 98.4 F (36.9 C)  SpO2: 92%   BP Readings from Last 3 Encounters:  07/28/23 114/78  07/14/23 (!) 104/58  07/09/23 122/69   Wt Readings from Last 3 Encounters:  07/28/23 254 lb (115.2 kg)  07/14/23 258 lb (117 kg)  07/09/23 259 lb 9.6 oz (117.8 kg)   Body mass index is 35.43 kg/m.    Physical Exam Constitutional:      General: He is not in acute distress.    Appearance: Normal appearance. He is not ill-appearing.  HENT:     Head: Normocephalic and atraumatic.  Eyes:     Conjunctiva/sclera: Conjunctivae normal.  Cardiovascular:     Rate and Rhythm: Normal rate and regular rhythm.     Heart sounds: Normal heart sounds.  Pulmonary:     Effort: Pulmonary effort is normal. No respiratory distress.     Breath sounds: Normal breath sounds. No wheezing or rales.  Musculoskeletal:     Right lower leg: Edema (wearing compression socks) present.     Left lower leg: Edema (wearing compression socks) present.  Skin:    General: Skin is warm and dry.     Findings: Rash (macular papular rash on b/l arms, legs) present.  Neurological:     Mental Status: He is alert.  Mental status is at baseline.  Psychiatric:        Mood and Affect: Mood normal.        Lab Results  Component Value Date   WBC 7.7 06/19/2023   HGB 17.9 (H) 06/19/2023   HCT 54.9 aH (H) 06/19/2023   PLT 138.0 (L) 06/19/2023   GLUCOSE 91 07/16/2023   CHOL 103 07/09/2023   TRIG 62 07/09/2023   HDL 36 (L) 07/09/2023   LDLCALC 55 07/09/2023   ALT 25 06/03/2023   AST 59 (H) 06/03/2023   NA 135 07/16/2023   K 4.3 07/16/2023   CL 99 07/16/2023   CREATININE 1.55 (H) 07/16/2023   BUN 16 07/16/2023   CO2 26 07/16/2023   TSH 2.211 06/06/2023   PSA 3.26 11/07/2019   INR 1.2 06/03/2023   HGBA1C 6.0 (H) 07/09/2023     Assessment & Plan:    See Problem List for Assessment and Plan of chronic medical problems.

## 2023-07-27 NOTE — Patient Instructions (Addendum)
    Flu immunization administered today.      Medications changes include :   triamcinolone cream for rash      Return in about 6 months (around 01/25/2024) for Physical Exam.

## 2023-07-28 ENCOUNTER — Ambulatory Visit (INDEPENDENT_AMBULATORY_CARE_PROVIDER_SITE_OTHER): Payer: Medicare HMO | Admitting: Internal Medicine

## 2023-07-28 VITALS — BP 114/78 | HR 70 | Temp 98.4°F | Ht 71.0 in | Wt 254.0 lb

## 2023-07-28 DIAGNOSIS — I10 Essential (primary) hypertension: Secondary | ICD-10-CM

## 2023-07-28 DIAGNOSIS — K219 Gastro-esophageal reflux disease without esophagitis: Secondary | ICD-10-CM | POA: Diagnosis not present

## 2023-07-28 DIAGNOSIS — I5032 Chronic diastolic (congestive) heart failure: Secondary | ICD-10-CM | POA: Diagnosis not present

## 2023-07-28 DIAGNOSIS — L247 Irritant contact dermatitis due to plants, except food: Secondary | ICD-10-CM | POA: Diagnosis not present

## 2023-07-28 DIAGNOSIS — Z23 Encounter for immunization: Secondary | ICD-10-CM | POA: Insufficient documentation

## 2023-07-28 DIAGNOSIS — R7303 Prediabetes: Secondary | ICD-10-CM

## 2023-07-28 DIAGNOSIS — R600 Localized edema: Secondary | ICD-10-CM | POA: Diagnosis not present

## 2023-07-28 MED ORDER — TRIAMCINOLONE ACETONIDE 0.1 % EX CREA: 1.0000 | TOPICAL_CREAM | Freq: Two times a day (BID) | CUTANEOUS | 0 refills | Status: DC

## 2023-07-28 NOTE — Assessment & Plan Note (Signed)
Chronic improved Currently on Lasix 20 mg daily, Farxiga 10 mg daily Has been working hard and successfully losing weight Plans to restart regular exercise

## 2023-07-28 NOTE — Assessment & Plan Note (Signed)
New Was working in yard and developed a rash-likely contact dermatitis secondary to plants Using over-the-counter cortisone cream with some effect Start triamcinolone 0.1% cream twice daily as needed

## 2023-07-28 NOTE — Assessment & Plan Note (Signed)
Chronic Appears euvolemic Shortness of breath improved, leg swelling improved Continue Farxiga 10 mg daily, furosemide 20 mg daily, metoprolol 12.5 mg twice daily

## 2023-07-28 NOTE — Addendum Note (Signed)
Addended by: Pincus Sanes on: 07/28/2023 01:47 PM   Modules accepted: Level of Service

## 2023-07-28 NOTE — Assessment & Plan Note (Signed)
Chronic GERD controlled Continue omeprazole 20 mg daily

## 2023-07-28 NOTE — Assessment & Plan Note (Signed)
Chronic Lab Results  Component Value Date   HGBA1C 6.0 (H) 07/09/2023   Compliant with diabetic diet Will start regular exercise Working on weight loss and has successfully lost weight with changes in diet Placed on Farxiga 10 mg daily by heart failure clinic

## 2023-07-28 NOTE — Assessment & Plan Note (Signed)
Chronic Blood pressure well controlled Continue metoprolol 12.5 mg twice daily

## 2023-07-31 ENCOUNTER — Ambulatory Visit: Payer: Medicare HMO | Admitting: Internal Medicine

## 2023-08-05 ENCOUNTER — Other Ambulatory Visit: Payer: Self-pay | Admitting: Internal Medicine

## 2023-08-11 DIAGNOSIS — R69 Illness, unspecified: Secondary | ICD-10-CM | POA: Diagnosis not present

## 2023-08-14 DIAGNOSIS — G4733 Obstructive sleep apnea (adult) (pediatric): Secondary | ICD-10-CM | POA: Diagnosis not present

## 2023-08-18 ENCOUNTER — Other Ambulatory Visit: Payer: Self-pay

## 2023-08-18 DIAGNOSIS — G4733 Obstructive sleep apnea (adult) (pediatric): Secondary | ICD-10-CM

## 2023-08-18 NOTE — Progress Notes (Signed)
Per Dr. Mayo Ao,  place order for CPAP = auto CPAP 5-20 cm of water through [facility]  Order placed.

## 2023-08-19 ENCOUNTER — Ambulatory Visit: Payer: Medicare HMO | Admitting: Podiatry

## 2023-08-19 ENCOUNTER — Encounter: Payer: Self-pay | Admitting: Podiatry

## 2023-08-19 VITALS — Ht 71.0 in | Wt 254.0 lb

## 2023-08-19 DIAGNOSIS — M7751 Other enthesopathy of right foot: Secondary | ICD-10-CM

## 2023-08-19 DIAGNOSIS — M722 Plantar fascial fibromatosis: Secondary | ICD-10-CM | POA: Diagnosis not present

## 2023-08-19 MED ORDER — TRIAMCINOLONE ACETONIDE 10 MG/ML IJ SUSP
10.0000 mg | Freq: Once | INTRAMUSCULAR | Status: AC
Start: 2023-08-19 — End: 2023-08-19
  Administered 2023-08-19: 10 mg via INTRA_ARTICULAR

## 2023-08-20 NOTE — Progress Notes (Signed)
Subjective:   Patient ID: Justin Padilla, male   DOB: 77 y.o.   MRN: [id number]   HPI Patient presents with a lot of pain in the right heel and also moderate discomfort in the ankle.  States the heel has been tender but that the ankle right now is worse on the outside.  States he has trouble bending it.  Patient does not smoke likes to be active as best as possible   Review of Systems  All other systems reviewed and are negative.       Objective:  Physical Exam Vitals and nursing note reviewed.  Constitutional:      Appearance: He is well-developed.  Pulmonary:     Effort: Pulmonary effort is normal.  Musculoskeletal:        General: Normal range of motion.  Skin:    General: Skin is warm.  Neurological:     Mental Status: He is alert.     Neurovascular status was found to be intact muscle strength adequate range of motion reduced subtalar joint right with inflammation pain mostly in the sinus tarsi mild discomfort in the plantar fascia also noted at the insertion tendon calcaneus.  Patient found to have good digital perfusion well-oriented x 3     Assessment:  Acute capsulitis of the subtalar joint right along with moderate plantar fasciitis     Plan:  H&P reviewed and x-rayed conditions.  Will get a focus on the sinus tarsi I did sterile prep I injected the sinus tarsi 3 mg Kenalog 5 mg Xylocaine I applied fascial brace to lift up the arch gave instructions on support and wear any uses to try to control the plantar fascia and reduce the inversion eversion of the subtalar joint.  Reappoint for [place] to recheck again as symptoms indicate may require MRI ultimately may require fusion  X-rays indicate that there is moderate changes subtalar joint consistent with probable arthritis plantar spur noted no indication stress fracture

## 2023-08-21 ENCOUNTER — Telehealth: Payer: Self-pay | Admitting: Internal Medicine

## 2023-08-21 ENCOUNTER — Other Ambulatory Visit: Payer: Self-pay

## 2023-08-21 MED ORDER — OMEPRAZOLE 20 MG PO CPDR: 20.0000 mg | DELAYED_RELEASE_CAPSULE | Freq: Every day | ORAL | 3 refills | Status: DC

## 2023-08-21 MED ORDER — METOPROLOL TARTRATE 25 MG PO TABS: 12.5000 mg | ORAL_TABLET | Freq: Two times a day (BID) | ORAL | 1 refills | Status: DC

## 2023-08-21 NOTE — Telephone Encounter (Signed)
Pt niece is calling about medications metoprolol tartrate (LOPRESSOR) 25 MG tablet & omeprazole (PRILOSEC) 20 MG capsule.

## 2023-08-21 NOTE — Telephone Encounter (Signed)
ERROR

## 2023-08-24 ENCOUNTER — Encounter: Payer: Self-pay | Admitting: Neurology

## 2023-08-24 NOTE — Telephone Encounter (Signed)
Scripts were sent in on Friday for patient.

## 2023-08-25 DIAGNOSIS — G4733 Obstructive sleep apnea (adult) (pediatric): Secondary | ICD-10-CM | POA: Diagnosis not present

## 2023-09-02 ENCOUNTER — Inpatient Hospital Stay
Admission: RE | Admit: 2023-09-02 | Discharge: 2023-09-02 | Discharge status: Home / Self Care | Payer: Medicare HMO | Source: Ambulatory Visit | Attending: Neurology

## 2023-09-02 DIAGNOSIS — R413 Other amnesia: Secondary | ICD-10-CM | POA: Diagnosis not present

## 2023-09-02 DIAGNOSIS — I6782 Cerebral ischemia: Secondary | ICD-10-CM | POA: Diagnosis not present

## 2023-09-06 DIAGNOSIS — R69 Illness, unspecified: Secondary | ICD-10-CM | POA: Diagnosis not present

## 2023-09-09 ENCOUNTER — Ambulatory Visit: Payer: Medicare HMO | Admitting: Podiatry

## 2023-09-10 ENCOUNTER — Telehealth (HOSPITAL_COMMUNITY): Payer: Self-pay | Admitting: *Deleted

## 2023-09-10 NOTE — Telephone Encounter (Signed)
PET scan auth in clinical review

## 2023-09-11 DIAGNOSIS — R69 Illness, unspecified: Secondary | ICD-10-CM | POA: Diagnosis not present

## 2023-09-23 ENCOUNTER — Encounter (HOSPITAL_COMMUNITY): Payer: Self-pay | Admitting: Cardiology

## 2023-09-23 ENCOUNTER — Ambulatory Visit (HOSPITAL_COMMUNITY)
Admission: RE | Admit: 2023-09-23 | Discharge: 2023-09-23 | Disposition: A | Discharge status: Home / Self Care | Payer: Medicare HMO | Source: Ambulatory Visit | Attending: Cardiology | Admitting: Cardiology

## 2023-09-23 VITALS — BP 110/70 | HR 58 | Wt 241.4 lb

## 2023-09-23 DIAGNOSIS — N183 Chronic kidney disease, stage 3 unspecified: Secondary | ICD-10-CM | POA: Insufficient documentation

## 2023-09-23 DIAGNOSIS — I5032 Chronic diastolic (congestive) heart failure: Secondary | ICD-10-CM | POA: Diagnosis not present

## 2023-09-23 DIAGNOSIS — Z6833 Body mass index (BMI) 33.0-33.9, adult: Secondary | ICD-10-CM | POA: Diagnosis not present

## 2023-09-23 DIAGNOSIS — G4733 Obstructive sleep apnea (adult) (pediatric): Secondary | ICD-10-CM | POA: Insufficient documentation

## 2023-09-23 DIAGNOSIS — R0609 Other forms of dyspnea: Secondary | ICD-10-CM | POA: Diagnosis not present

## 2023-09-23 DIAGNOSIS — I7121 Aneurysm of the ascending aorta, without rupture: Secondary | ICD-10-CM | POA: Insufficient documentation

## 2023-09-23 DIAGNOSIS — E669 Obesity, unspecified: Secondary | ICD-10-CM | POA: Diagnosis not present

## 2023-09-23 DIAGNOSIS — Z7984 Long term (current) use of oral hypoglycemic drugs: Secondary | ICD-10-CM | POA: Insufficient documentation

## 2023-09-23 DIAGNOSIS — I130 Hypertensive heart and chronic kidney disease with heart failure and stage 1 through stage 4 chronic kidney disease, or unspecified chronic kidney disease: Secondary | ICD-10-CM | POA: Diagnosis not present

## 2023-09-23 DIAGNOSIS — Z79899 Other long term (current) drug therapy: Secondary | ICD-10-CM | POA: Insufficient documentation

## 2023-09-23 LAB — BASIC METABOLIC PANEL
Anion gap: 11 (ref 5–15)
BUN: 19 mg/dL (ref 8–23)
CO2: 22 mmol/L (ref 22–32)
Calcium: 9.3 mg/dL (ref 8.9–10.3)
Chloride: 106 mmol/L (ref 98–111)
Creatinine, Ser: 1.34 mg/dL — ABNORMAL HIGH (ref 0.61–1.24)
GFR, Estimated: 54 mL/min — ABNORMAL LOW (ref >60)
Glucose, Bld: 84 mg/dL (ref 70–99)
Potassium: 4.3 mmol/L (ref 3.5–5.1)
Sodium: 139 mmol/L (ref 135–145)

## 2023-09-23 LAB — BRAIN NATRIURETIC PEPTIDE: B Natriuretic Peptide: 23.2 pg/mL (ref 0.0–100.0)

## 2023-09-23 MED ORDER — SPIRONOLACTONE 25 MG PO TABS: 12.5000 mg | ORAL_TABLET | Freq: Every day | ORAL | 3 refills | Status: DC

## 2023-09-23 NOTE — Progress Notes (Signed)
ReDS Vest / Clip - 09/23/23 1200       ReDS Vest / Clip   Station Marker D    Ruler Value 32    ReDS Value Range Moderate volume overload    ReDS Actual Value 39

## 2023-09-23 NOTE — Progress Notes (Signed)
PCP: Pincus Sanes, MD HF Cardiology: Dr. Shirlee Latch  77 y.o. with history of HTN, OSA, and peripheral edema was referred by Dr. Lawerance Bach for evaluation of CHF.  In early 2024, he began to note significant peripheral edema. He also noted dyspnea with moderate exertion.  He was started on Lasix 20 mg daily by his PCP.  He has continued to have peripheral edema with some improvement with addition of compression stockings.  At initial appointment, I started him on Farxiga.   Patient returns today for followup of diastolic CHF.  Insurance did not approve his cardiac PET for ischemic evaluation.  Weight is down 18 lbs since last appointment.  His breathing has improved, no dyspnea walking on flat ground but he does get short of breath walking up hills and stairs. He is using CPAP at night.  No chest pain.  No orthopnea/PND.  Going to the gym 3 days/week and working in the yard.  He has cut back on sodium intake.   REDS clip 39%  ECG (personally reviewed):  NSR, 1st degree AVB, RBBB  Labs (8/24): LDL 55, BNP 146 => 12.5, K 5.4, creatinine 1.63 Labs (9/24): K 4.3, creatinine 1.55  PMH: 1. GERD 2. HTN 3. OSA: uses CPAP 4. Chronic diastolic CHF: Echo (7/24) with EF 65-70%, normal RV size and systolic function, no MR, no aortic stenosis.  5. H/o lung nodules.  6. Dilated ascending aorta: 4.1 cm on 6/24 CT chest.  7. CKD stage 3  Social History   Socioeconomic History   Marital status: Divorced    Spouse name: Not on file   Number of children: 1   Years of education: 14   Highest education level: Not on file  Occupational History   Not on file  Tobacco Use   Smoking status: Former    Current packs/day: 0.00    Types: Cigarettes    Start date: 05/1963    Quit date: 05/2008    Years since quitting: 15.3   Smokeless tobacco: Never   Tobacco comments:    quit in 2009  Vaping Use   Vaping status: Never Used  Substance and Sexual Activity   Alcohol use: Not Currently    Alcohol/week: 1.0  standard drink of alcohol    Types: 1 Cans of beer per week    Comment: every month or 2 months.    Drug use: Not Currently    Comment: Marijuana long time ago it was occassional smoking,   Sexual activity: Not Currently  Other Topics Concern   Not on file  Social History Narrative   Diet:  No      Do you drink/ eat things with caffeine? Yes      Marital status:      Single                         What year were you married ? 1967      Do you live in a house, apartment,assistred living, condo, trailer, etc.)? House      Is it one or more stories? No      How many persons live in your home ?  Me      Do you have any pets in your home ?(please list)  NO      Highest Level of education completed: 1 Buyer, retail       Current or past profession: Training and development officer & [employer]      Do  you exercise?  No                            Type & how often       ADVANCED DIRECTIVES (Please bring copies)      Do you have a living will? Yes      Do you have a DNR form?   Yes                    If not, do you want to discuss one?       Do you have signed POA?HPOA forms?   Yes              If so, please bring to your appointment      FUNCTIONAL STATUS- To be completed by Spouse / child / Staff       Do you have difficulty bathing or dressing yourself ? No      Do you have difficulty preparing food or eating ? No      Do you have difficulty managing your mediation ? No      Do you have difficulty managing your finances ? No      Do you have difficulty affording your medication ? No            Right Handed    Lives in a one story home alone       Social Determinants of Health   Financial Resource Strain: Low Risk  (08/21/2022)   Overall Financial Resource Strain (CARDIA)    Difficulty of Paying Living Expenses: Not hard at all  Food Insecurity: No Food Insecurity (06/09/2023)   Hunger Vital Sign    Worried About Running Out of Food in the Last Year: Never true    Ran Out of Food  in the Last Year: Never true  Transportation Needs: No Transportation Needs (06/09/2023)   PRAPARE - Administrator, Civil Service (Medical): No    Lack of Transportation (Non-Medical): No  Physical Activity: Sufficiently Active (08/21/2022)   Exercise Vital Sign    Days of Exercise per Week: 3 days    Minutes of Exercise per Session: 130 min  Stress: No Stress Concern Present (08/21/2022)   [name] of Occupational Health - Occupational Stress Questionnaire    Feeling of Stress : Only a little  Social Connections: Moderately Isolated (08/21/2022)   Social Connection and Isolation Panel [NHANES]    Frequency of Communication with Friends and Family: More than three times a week    Frequency of Social Gatherings with Friends and Family: Twice a week    Attends Religious Services: Never    Database administrator or Organizations: Yes    Attends Engineer, structural: More than 4 times per year    Marital Status: Divorced  Intimate Partner Violence: Not At Risk (08/21/2022)   Humiliation, Afraid, Rape, and Kick questionnaire    Fear of Current or Ex-Partner: No    Emotionally Abused: No    Physically Abused: No    Sexually Abused: No   Family History  Problem Relation Age of Onset   Hepatitis C Mother 24   Stomach cancer Father 42   Von Willebrand disease Sister    Autoimmune disease Sister    Autoimmune disease Brother 58   Colon polyps Brother    Colon cancer Maternal Grandmother    Anuerysm Son 8   Esophageal cancer Neg  Hx    ROS: All systems reviewed and negative except as per HPI.   Current Outpatient Medications  Medication Sig Dispense Refill   acetaminophen (TYLENOL) 500 MG tablet Take 1,000 mg by mouth as needed for moderate pain.     dapagliflozin propanediol (FARXIGA) 10 MG TABS tablet Take 1 tablet (10 mg total) by mouth daily before breakfast. 30 tablet 6   furosemide (LASIX) 20 MG tablet Take 1 tablet (20 mg total) by mouth daily. 30  tablet 0   metoprolol tartrate (LOPRESSOR) 25 MG tablet Take 0.5 tablets (12.5 mg total) by mouth 2 (two) times daily. 90 tablet 1   multivitamin (CENTRUM) chewable tablet Chew 2 tablets by mouth daily.     omeprazole (PRILOSEC) 20 MG capsule Take 1 capsule (20 mg total) by mouth daily. 90 capsule 3   spironolactone (ALDACTONE) 25 MG tablet Take 0.5 tablets (12.5 mg total) by mouth daily. 45 tablet 3   triamcinolone cream (KENALOG) 0.1 % Apply 1 Application topically as needed.     No current facility-administered medications for this encounter.   BP 110/70   Pulse (!) 58   Wt 109.5 kg (241 lb 6.4 oz)   SpO2 94%   BMI 33.67 kg/m  General: NAD Neck: JVP 8 cm, no thyromegaly or thyroid nodule.  Lungs: Clear to auscultation bilaterally with normal respiratory effort. CV: Nondisplaced PMI.  Heart regular S1/S2, no S3/S4, no murmur.  1+ ankle edema.  No carotid bruit.  Normal pedal pulses.  Abdomen: Soft, nontender, no hepatosplenomegaly, no distention.  Skin: Intact without lesions or rashes.  Neurologic: Alert and oriented x 3.  Psych: Normal affect. Extremities: No clubbing or cyanosis.  HEENT: Normal.   Assessment/Plan: 1. Chronic diastolic CHF: Echo in 7/24 showed EF 65-70%, normal RV size and systolic function, no MR, no aortic stenosis. He is mildly volume overloaded on exam and by REDS clip with NYHA class II symptoms, improved from prior appointment.  Volume overload is complicated by CKD stage 3.  - I will continue current Lasix 20 mg daily.  - Continue Farxiga 10 mg daily.  - I wanted to start him on finerenone but he cannot get this covered by insurance.  Instead, will start spironolactone 12.5 mg daily. BMET/BNP today, BMET in 10 days.  - Continue to wear compression stockings.  - With exertional symptoms, I think an ischemic evaluation would be valuable as this has not been done in the past.  Given body habitus/weight, had preferred cardiac PET to assess for ischemia.  However, his insurance will not allow this so I will arrange for a Lexiscan Cardiolite (avoiding coronary CTA with elevated creatinine).  2. CKD stage 3: Continue Farxiga.  3. HTN: BP is currently controlled.  4. OSA: Continue CPAP.  5. Ascending aortic aneurysm: 4.1 cm (mildly dilated) on 6/24 CT.  6. Obesity: Patient has significant abdominal obesity.  I think that his breathing would improve with weight loss.  - I recommended GLP-1 agonist.  He is losing weight on his own currently and wants to hold off on semaglutide unless his weight loss plateaus.   Followup 4 months.   Marca Ancona 09/23/2023

## 2023-09-23 NOTE — Patient Instructions (Addendum)
START Spironolactone 12.5 mg ( 1/2 tab ) daily   Labs done today, your results will be available in MyChart, we will contact you for abnormal readings.  Your physician recommends that you schedule a follow-up appointment in: 4 months ( March 2025) ** PLEASE CALL THE OFFICE IN [place] TO ARRANGE YOUR FOLLOW UP APPOINTMENT. **  If you have any questions or concerns before your next appointment please send [place] a message through [place] or call our office at [phone].    TO LEAVE A MESSAGE FOR THE NURSE SELECT OPTION 2, PLEASE LEAVE A MESSAGE INCLUDING: YOUR NAME DATE OF BIRTH CALL BACK NUMBER REASON FOR CALL**this is important as we prioritize the call backs  YOU WILL RECEIVE A CALL BACK THE SAME DAY AS LONG AS YOU CALL BEFORE 4:00 PM  At the [facility], you and your health needs are our priority. As part of our continuing mission to provide you with exceptional heart care, we have created designated Provider Care Teams. These Care Teams include your primary Cardiologist (physician) and Advanced Practice Providers (APPs- Physician Assistants and Nurse Practitioners) who all work together to provide you with the care you need, when you need it.   You may see any of the following providers on your designated Care Team at your next follow up: Dr Arvilla Meres Dr Marca Ancona Dr. Dorthula Nettles Dr. Clearnce Hasten Amy Filbert Schilder, NP Robbie Lis, [place] [facility] [place], [place] Brynda Peon, NP [place] Lee, NP Karle Plumber, PharmD   Please be sure to bring in all your medications bottles to every appointment.    Thank you for choosing [facility]

## 2023-09-25 DIAGNOSIS — G4733 Obstructive sleep apnea (adult) (pediatric): Secondary | ICD-10-CM | POA: Diagnosis not present

## 2023-09-30 ENCOUNTER — Telehealth: Payer: Self-pay | Admitting: *Deleted

## 2023-10-05 ENCOUNTER — Ambulatory Visit (HOSPITAL_COMMUNITY)
Admission: RE | Admit: 2023-10-05 | Discharge: 2023-10-05 | Disposition: A | Discharge status: Home / Self Care | Payer: Medicare HMO | Source: Ambulatory Visit | Attending: Internal Medicine | Admitting: Internal Medicine

## 2023-10-05 DIAGNOSIS — I5032 Chronic diastolic (congestive) heart failure: Secondary | ICD-10-CM | POA: Insufficient documentation

## 2023-10-05 LAB — BASIC METABOLIC PANEL
Anion gap: 8 (ref 5–15)
BUN: 21 mg/dL (ref 8–23)
CO2: 25 mmol/L (ref 22–32)
Calcium: 9.1 mg/dL (ref 8.9–10.3)
Chloride: 105 mmol/L (ref 98–111)
Creatinine, Ser: 1.43 mg/dL — ABNORMAL HIGH (ref 0.61–1.24)
GFR, Estimated: 50 mL/min — ABNORMAL LOW (ref >60)
Glucose, Bld: 90 mg/dL (ref 70–99)
Potassium: 4.8 mmol/L (ref 3.5–5.1)
Sodium: 138 mmol/L (ref 135–145)

## 2023-10-11 DIAGNOSIS — R69 Illness, unspecified: Secondary | ICD-10-CM | POA: Diagnosis not present

## 2023-10-20 ENCOUNTER — Encounter (HOSPITAL_COMMUNITY): Payer: Self-pay

## 2023-10-21 ENCOUNTER — Ambulatory Visit: Payer: Medicare HMO | Admitting: Pulmonary Disease

## 2023-10-23 ENCOUNTER — Ambulatory Visit (HOSPITAL_COMMUNITY): Payer: Medicare HMO | Attending: Cardiology

## 2023-10-23 ENCOUNTER — Telehealth (HOSPITAL_COMMUNITY): Payer: Self-pay

## 2023-10-23 DIAGNOSIS — I5032 Chronic diastolic (congestive) heart failure: Secondary | ICD-10-CM | POA: Diagnosis not present

## 2023-10-23 LAB — MYOCARDIAL PERFUSION IMAGING
LV dias vol: 82 mL (ref 62–150)
LV sys vol: 23 mL
Nuc Stress EF: 72 %
Peak HR: 86 {beats}/min
Rest HR: 59 {beats}/min
Rest Nuclear Isotope Dose: 10.2 mCi
SDS: 1
SRS: 2
SSS: 3
ST Depression (mm): 0 mm
Stress Nuclear Isotope Dose: 32.5 mCi
TID: 0.88

## 2023-10-23 MED ORDER — TECHNETIUM TC 99M TETROFOSMIN IV KIT
10.2000 | PACK | Freq: Once | INTRAVENOUS | Status: AC | PRN
  Administered 2023-10-23: 10.2 via INTRAVENOUS

## 2023-10-23 MED ORDER — TECHNETIUM TC 99M TETROFOSMIN IV KIT
32.5000 | PACK | Freq: Once | INTRAVENOUS | Status: AC | PRN
  Administered 2023-10-23: 32.5 via INTRAVENOUS

## 2023-10-23 MED ORDER — REGADENOSON 0.4 MG/5ML IV SOLN
0.4000 mg | Freq: Once | INTRAVENOUS | Status: AC
  Administered 2023-10-23: 0.4 mg via INTRAVENOUS

## 2023-10-23 NOTE — Telephone Encounter (Signed)
-----   Message from Marca Ancona sent at 10/23/2023  2:27 PM EST ----- EF 72%, no ischemia/infarction. Normal.

## 2023-10-23 NOTE — Telephone Encounter (Signed)
Me     10/23/23  3:17 PM Result Note Spoke with patients niece- okay per DPR advised her of the below results. She verbalized understanding.

## 2023-10-25 DIAGNOSIS — G4733 Obstructive sleep apnea (adult) (pediatric): Secondary | ICD-10-CM | POA: Diagnosis not present

## 2023-11-23 ENCOUNTER — Telehealth: Payer: Self-pay | Admitting: Internal Medicine

## 2023-11-23 NOTE — Telephone Encounter (Signed)
 Copied from CRM [id number]. Topic: Clinical - Medical Advice >> Nov 23, 2023 11:32 AM Franky GRADE wrote: Reason for CRM: Patient's niece is calling to discuss some concerns she has about the patient. He no longer wants to see the neurologist and is not accepting the dementia diagnosis and is giving his niece a very difficult time. She would like to speak with Dr.Burns because patient only listens when the advise is coming from Dr.Burns.

## 2023-11-25 DIAGNOSIS — G4733 Obstructive sleep apnea (adult) (pediatric): Secondary | ICD-10-CM | POA: Diagnosis not present

## 2023-11-25 NOTE — Telephone Encounter (Signed)
 Yes, lets move up his follow-up appointment.  His niece could come with him or I can always call her before the visit if she does not come with him.

## 2023-11-26 DIAGNOSIS — G4733 Obstructive sleep apnea (adult) (pediatric): Secondary | ICD-10-CM | POA: Diagnosis not present

## 2023-11-26 NOTE — Telephone Encounter (Signed)
noted 

## 2023-12-02 ENCOUNTER — Ambulatory Visit: Payer: Medicare HMO | Admitting: Podiatry

## 2023-12-11 ENCOUNTER — Ambulatory Visit (INDEPENDENT_AMBULATORY_CARE_PROVIDER_SITE_OTHER): Payer: Medicare HMO

## 2023-12-11 DIAGNOSIS — Z0000 Encounter for general adult medical examination without abnormal findings: Secondary | ICD-10-CM

## 2023-12-11 NOTE — Patient Instructions (Addendum)
Justin Padilla , Thank you for taking time to come for your Medicare Wellness Visit. I appreciate your ongoing commitment to your health goals. Please review the following plan we discussed and let me know if I can assist you in the future.   Referrals/Orders/Follow-Ups/Clinician Recommendations: It was nice talking to you today.  Keep up the good work.  This is a list of the screening recommended for you and due dates:  Health Maintenance  Topic Date Due   DTaP/Tdap/Td vaccine (1 - Tdap) Never done   Zoster (Shingles) Vaccine (1 of 2) Never done   COVID-19 Vaccine (4 - 2024-25 season) 07/12/2023   Medicare Annual Wellness Visit  12/10/2024   Colon Cancer Screening  11/18/2025   Pneumonia Vaccine  Completed   Flu Shot  Completed   Hepatitis C Screening  Completed   HPV Vaccine  Aged Out   Screening for Lung Cancer  Discontinued    Advanced directives: (In Chart) A copy of your advanced directives are scanned into your chart should your provider ever need it.  Next Medicare Annual Wellness Visit scheduled for next year: Yes
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
## 2023-12-26 DIAGNOSIS — G4733 Obstructive sleep apnea (adult) (pediatric): Secondary | ICD-10-CM | POA: Diagnosis not present

## 2024-01-23 DIAGNOSIS — G4733 Obstructive sleep apnea (adult) (pediatric): Secondary | ICD-10-CM | POA: Diagnosis not present

## 2024-01-26 ENCOUNTER — Encounter: Payer: Self-pay | Admitting: Internal Medicine

## 2024-01-26 NOTE — Progress Notes (Unsigned)
 Subjective:    Patient ID: Justin Padilla, male    DOB: [birth year], 78 y.o.   MRN: [id number]     HPI Justin Padilla is here for a physical exam and his chronic medical problems.      Medications and allergies reviewed with patient and updated if appropriate.  Current Outpatient Medications on File Prior to Visit  Medication Sig Dispense Refill   acetaminophen (TYLENOL) 500 MG tablet Take 1,000 mg by mouth as needed for moderate pain.     dapagliflozin propanediol (FARXIGA) 10 MG TABS tablet Take 1 tablet (10 mg total) by mouth daily before breakfast. 30 tablet 6   furosemide (LASIX) 20 MG tablet Take 1 tablet (20 mg total) by mouth daily. 30 tablet 0   metoprolol tartrate (LOPRESSOR) 25 MG tablet Take 0.5 tablets (12.5 mg total) by mouth 2 (two) times daily. 90 tablet 1   multivitamin (CENTRUM) chewable tablet Chew 2 tablets by mouth daily.     omeprazole (PRILOSEC) 20 MG capsule Take 1 capsule (20 mg total) by mouth daily. 90 capsule 3   spironolactone (ALDACTONE) 25 MG tablet Take 0.5 tablets (12.5 mg total) by mouth daily. 45 tablet 3   triamcinolone cream (KENALOG) 0.1 % Apply 1 Application topically as needed.     No current facility-administered medications on file prior to visit.    Review of Systems     Objective:  There were no vitals filed for this visit. There were no vitals filed for this visit. There is no height or weight on file to calculate BMI.  BP Readings from Last 3 Encounters:  09/23/23 110/70  07/28/23 114/78  07/14/23 (!) 104/58    Wt Readings from Last 3 Encounters:  09/23/23 241 lb 6.4 oz (109.5 kg)  08/19/23 254 lb (115.2 kg)  07/28/23 254 lb (115.2 kg)      Physical Exam Constitutional: He appears well-developed and well-nourished. No distress.  HENT:  Head: Normocephalic and atraumatic.  Right Ear: External ear normal.  Left Ear: External ear normal.  Normal ear canals and TM b/l  Mouth/Throat: Oropharynx is clear and moist. Eyes:  Conjunctivae and EOM are normal.  Neck: Neck supple. No tracheal deviation present. No thyromegaly present.  No carotid bruit  Cardiovascular: Normal rate, regular rhythm, normal heart sounds and intact distal pulses.   No murmur heard.  No lower extremity edema. Pulmonary/Chest: Effort normal and breath sounds normal. No respiratory distress. He has no wheezes. He has no rales.  Abdominal: Soft. He exhibits no distension. There is no tenderness.  Genitourinary: deferred  Lymphadenopathy:   He has no cervical adenopathy.  Skin: Skin is warm and dry. He is not diaphoretic.  Psychiatric: He has a normal mood and affect. His behavior is normal.         Assessment & Plan:   Physical exam: Screening blood work  ordered Exercise    Weight   Substance abuse   none   Reviewed recommended immunizations.   Health Maintenance  Topic Date Due   DTaP/Tdap/Td (1 - Tdap) Never done   Zoster Vaccines- Shingrix (1 of 2) Never done   COVID-19 Vaccine (4 - 2024-25 season) 07/12/2023   Medicare Annual Wellness (AWV)  12/10/2024   Colonoscopy  11/18/2025   Pneumonia Vaccine 60+ Years old  Completed   INFLUENZA VACCINE  Completed   Hepatitis C Screening  Completed   HPV VACCINES  Aged Out   Lung Cancer Screening  Discontinued     See Problem  List for Assessment and Plan of chronic medical problems.

## 2024-01-26 NOTE — Patient Instructions (Addendum)
 Blood work was ordered.       Medications changes include :   None    A referral was ordered and someone will call you to schedule an appointment.     Return in about 6 months (around 07/29/2024) for follow up.   Health Maintenance, Male Adopting a healthy lifestyle and getting preventive care are important in promoting health and wellness. Ask your health care provider about: The right schedule for you to have regular tests and exams. Things you can do on your own to prevent diseases and keep yourself healthy. What should I know about diet, weight, and exercise? Eat a healthy diet  Eat a diet that includes plenty of vegetables, fruits, low-fat dairy products, and lean protein. Do not eat a lot of foods that are high in solid fats, added sugars, or sodium. Maintain a healthy weight Body mass index (BMI) is a measurement that can be used to identify possible weight problems. It estimates body fat based on height and weight. Your health care provider can help determine your BMI and help you achieve or maintain a healthy weight. Get regular exercise Get regular exercise. This is one of the most important things you can do for your health. Most adults should: Exercise for at least 150 minutes each week. The exercise should increase your heart rate and make you sweat (moderate-intensity exercise). Do strengthening exercises at least twice a week. This is in addition to the moderate-intensity exercise. Spend less time sitting. Even light physical activity can be beneficial. Watch cholesterol and blood lipids Have your blood tested for lipids and cholesterol at 79 years of age, then have this test every 5 years. You may need to have your cholesterol levels checked more often if: Your lipid or cholesterol levels are high. You are older than 79 years of age. You are at high risk for heart disease. What should I know about cancer screening? Many types of cancers can be detected  early and may often be prevented. Depending on your health history and family history, you may need to have cancer screening at various ages. This may include screening for: Colorectal cancer. Prostate cancer. Skin cancer. Lung cancer. What should I know about heart disease, diabetes, and high blood pressure? Blood pressure and heart disease High blood pressure causes heart disease and increases the risk of stroke. This is more likely to develop in people who have high blood pressure readings or are overweight. Talk with your health care provider about your target blood pressure readings. Have your blood pressure checked: Every 3-5 years if you are 79-31 years of age. Every year if you are 83 years old or older. If you are between the ages of 73 and 18 and are a current or former smoker, ask your health care provider if you should have a one-time screening for abdominal aortic aneurysm (AAA). Diabetes Have regular diabetes screenings. This checks your fasting blood sugar level. Have the screening done: Once every three years after age 76 if you are at a normal weight and have a low risk for diabetes. More often and at a younger age if you are overweight or have a high risk for diabetes. What should I know about preventing infection? Hepatitis B If you have a higher risk for hepatitis B, you should be screened for this virus. Talk with your health care provider to find out if you are at risk for hepatitis B infection. Hepatitis C Blood testing is recommended for:  Everyone born from [birth year] through 1966. Anyone with known risk factors for hepatitis C. Sexually transmitted infections (STIs) You should be screened each year for STIs, including gonorrhea and chlamydia, if: You are sexually active and are younger than 79 years of age. You are older than 79 years of age and your health care provider tells you that you are at risk for this type of infection. Your sexual activity has changed since  you were last screened, and you are at increased risk for chlamydia or gonorrhea. Ask your health care provider if you are at risk. Ask your health care provider about whether you are at high risk for HIV. Your health care provider may recommend a prescription medicine to help prevent HIV infection. If you choose to take medicine to prevent HIV, you should first get tested for HIV. You should then be tested every 3 months for as long as you are taking the medicine. Follow these instructions at home: Alcohol use Do not drink alcohol if your health care provider tells you not to drink. If you drink alcohol: Limit how much you have to 0-2 drinks a day. Know how much alcohol is in your drink. In the [place] one drink equals one 12 oz bottle of beer (355 mL), one 5 oz glass of wine (148 mL), or one 1 oz glass of hard liquor (44 mL). Lifestyle Do not use any products that contain nicotine or tobacco. These products include cigarettes, chewing tobacco, and vaping devices, such as e-cigarettes. If you need help quitting, ask your health care provider. Do not use street drugs. Do not share needles. Ask your health care provider for help if you need support or information about quitting drugs. General instructions Schedule regular health, dental, and eye exams. Stay current with your vaccines. Tell your health care provider if: You often feel depressed. You have ever been abused or do not feel safe at home. Summary Adopting a healthy lifestyle and getting preventive care are important in promoting health and wellness. Follow your health care provider's instructions about healthy diet, exercising, and getting tested or screened for diseases. Follow your health care provider's instructions on monitoring your cholesterol and blood pressure. This information is not intended to replace advice given to you by your health care provider. Make sure you discuss any questions you have with your health care  provider. Document Revised: 03/18/2021 Document Reviewed: 03/18/2021 Elsevier Patient Education  2024 [employer].

## 2024-01-27 ENCOUNTER — Ambulatory Visit (INDEPENDENT_AMBULATORY_CARE_PROVIDER_SITE_OTHER): Payer: Medicare HMO | Admitting: Internal Medicine

## 2024-01-27 VITALS — BP 106/74 | HR 60 | Temp 98.2°F | Ht 71.0 in | Wt 228.0 lb

## 2024-01-27 DIAGNOSIS — K219 Gastro-esophageal reflux disease without esophagitis: Secondary | ICD-10-CM | POA: Diagnosis not present

## 2024-01-27 DIAGNOSIS — R7303 Prediabetes: Secondary | ICD-10-CM

## 2024-01-27 DIAGNOSIS — D696 Thrombocytopenia, unspecified: Secondary | ICD-10-CM

## 2024-01-27 DIAGNOSIS — D751 Secondary polycythemia: Secondary | ICD-10-CM

## 2024-01-27 DIAGNOSIS — Z0000 Encounter for general adult medical examination without abnormal findings: Secondary | ICD-10-CM

## 2024-01-27 DIAGNOSIS — R600 Localized edema: Secondary | ICD-10-CM | POA: Diagnosis not present

## 2024-01-27 DIAGNOSIS — I10 Essential (primary) hypertension: Secondary | ICD-10-CM | POA: Diagnosis not present

## 2024-01-27 DIAGNOSIS — I5032 Chronic diastolic (congestive) heart failure: Secondary | ICD-10-CM

## 2024-01-27 NOTE — Assessment & Plan Note (Signed)
Chronic GERD controlled Continue omeprazole 20 mg daily  

## 2024-01-27 NOTE — Assessment & Plan Note (Addendum)
 Chronic improved Currently on Lasix 20 mg daily, Farxiga 10 mg daily, spironolactone 12.5 mg daily Wearing compression socks daily Has been working hard and successfully losing weight He is exercising regularly

## 2024-01-27 NOTE — Assessment & Plan Note (Signed)
 Chronic Lab Results  Component Value Date   HGBA1C 6.0 (H) 07/09/2023   Compliant with diabetic diet Will start regular exercise Working on weight loss and has successfully lost weight with changes in diet Continue Farxiga 10 mg daily for heart failure which will also help his sugars

## 2024-01-27 NOTE — Assessment & Plan Note (Addendum)
 Chronic Appears euvolemic Following with cardiology Continue Farxiga 10 mg daily, furosemide 20 mg daily, metoprolol 12.5 mg twice daily, spironolactone 12.5 mg daily BNP, CMP

## 2024-01-27 NOTE — Assessment & Plan Note (Signed)
Chronic Mild cbc 

## 2024-01-27 NOTE — Assessment & Plan Note (Signed)
 Chronic Blood pressure well controlled CBC, CMP Continue  metoprolol 12.5 mg twice daily, spironolactone 12.5 mg daily

## 2024-01-27 NOTE — Assessment & Plan Note (Signed)
 Chronic Has been stable Consistently using his CPAP machine cbc

## 2024-02-01 ENCOUNTER — Other Ambulatory Visit (INDEPENDENT_AMBULATORY_CARE_PROVIDER_SITE_OTHER)

## 2024-02-01 ENCOUNTER — Telehealth: Payer: Self-pay

## 2024-02-01 DIAGNOSIS — D751 Secondary polycythemia: Secondary | ICD-10-CM | POA: Diagnosis not present

## 2024-02-01 DIAGNOSIS — D696 Thrombocytopenia, unspecified: Secondary | ICD-10-CM

## 2024-02-01 DIAGNOSIS — I5032 Chronic diastolic (congestive) heart failure: Secondary | ICD-10-CM

## 2024-02-01 DIAGNOSIS — I10 Essential (primary) hypertension: Secondary | ICD-10-CM

## 2024-02-01 DIAGNOSIS — R7303 Prediabetes: Secondary | ICD-10-CM

## 2024-02-01 LAB — COMPREHENSIVE METABOLIC PANEL
ALT: 8 U/L (ref 0–53)
AST: 14 U/L (ref 0–37)
Albumin: 4.6 g/dL (ref 3.5–5.2)
Alkaline Phosphatase: 75 U/L (ref 39–117)
BUN: 21 mg/dL (ref 6–23)
CO2: 27 meq/L (ref 19–32)
Calcium: 9.5 mg/dL (ref 8.4–10.5)
Chloride: 103 meq/L (ref 96–112)
Creatinine, Ser: 1.35 mg/dL (ref 0.40–1.50)
GFR: 50.15 mL/min — ABNORMAL LOW (ref >60.00)
Glucose, Bld: 97 mg/dL (ref 70–99)
Potassium: 4.7 meq/L (ref 3.5–5.1)
Sodium: 138 meq/L (ref 135–145)
Total Bilirubin: 0.5 mg/dL (ref 0.2–1.2)
Total Protein: 7.4 g/dL (ref 6.0–8.3)

## 2024-02-01 LAB — LIPID PANEL
Cholesterol: 124 mg/dL (ref 0–200)
HDL: 42.3 mg/dL (ref >39.00)
LDL Cholesterol: 70 mg/dL (ref 0–99)
NonHDL: 81.29
Total CHOL/HDL Ratio: 3
Triglycerides: 57 mg/dL (ref 0.0–149.0)
VLDL: 11.4 mg/dL (ref 0.0–40.0)

## 2024-02-01 LAB — CBC WITH DIFFERENTIAL/PLATELET
Basophils Absolute: 0 10*3/uL (ref 0.0–0.1)
Basophils Relative: 0.4 % (ref 0.0–3.0)
Eosinophils Absolute: 0.1 10*3/uL (ref 0.0–0.7)
Eosinophils Relative: 1.1 % (ref 0.0–5.0)
HCT: 55.9 % — ABNORMAL HIGH (ref 39.0–52.0)
Hemoglobin: 18.9 g/dL (ref 13.0–17.0)
Lymphocytes Relative: 23 % (ref 12.0–46.0)
Lymphs Abs: 1.8 10*3/uL (ref 0.7–4.0)
MCHC: 33.8 g/dL (ref 30.0–36.0)
MCV: 93.3 fl (ref 78.0–100.0)
Monocytes Absolute: 0.7 10*3/uL (ref 0.1–1.0)
Monocytes Relative: 8.5 % (ref 3.0–12.0)
Neutro Abs: 5.3 10*3/uL (ref 1.4–7.7)
Neutrophils Relative %: 67 % (ref 43.0–77.0)
Platelets: 111 10*3/uL — ABNORMAL LOW (ref 150.0–400.0)
RBC: 5.99 Mil/uL — ABNORMAL HIGH (ref 4.22–5.81)
RDW: 13.8 % (ref 11.5–15.5)
WBC: 8 10*3/uL (ref 4.0–10.5)

## 2024-02-01 LAB — BRAIN NATRIURETIC PEPTIDE: Pro B Natriuretic peptide (BNP): 32 pg/mL (ref 0.0–100.0)

## 2024-02-01 LAB — HEMOGLOBIN A1C: Hgb A1c MFr Bld: 5.7 % (ref 4.6–6.5)

## 2024-02-01 NOTE — Telephone Encounter (Signed)
 noted

## 2024-02-02 ENCOUNTER — Other Ambulatory Visit (HOSPITAL_COMMUNITY): Payer: Self-pay | Admitting: Cardiology

## 2024-02-03 ENCOUNTER — Encounter (HOSPITAL_COMMUNITY): Payer: Self-pay | Admitting: Cardiology

## 2024-02-03 ENCOUNTER — Ambulatory Visit (HOSPITAL_COMMUNITY)
Admission: RE | Admit: 2024-02-03 | Discharge: 2024-02-03 | Disposition: A | Discharge status: Home / Self Care | Payer: Medicare HMO | Source: Ambulatory Visit | Attending: Cardiology | Admitting: Cardiology

## 2024-02-03 VITALS — BP 104/64 | HR 48 | Wt 225.8 lb

## 2024-02-03 DIAGNOSIS — R600 Localized edema: Secondary | ICD-10-CM | POA: Diagnosis not present

## 2024-02-03 DIAGNOSIS — I5032 Chronic diastolic (congestive) heart failure: Secondary | ICD-10-CM | POA: Insufficient documentation

## 2024-02-03 DIAGNOSIS — I4510 Unspecified right bundle-branch block: Secondary | ICD-10-CM | POA: Diagnosis not present

## 2024-02-03 DIAGNOSIS — G4733 Obstructive sleep apnea (adult) (pediatric): Secondary | ICD-10-CM | POA: Insufficient documentation

## 2024-02-03 DIAGNOSIS — N183 Chronic kidney disease, stage 3 unspecified: Secondary | ICD-10-CM | POA: Insufficient documentation

## 2024-02-03 DIAGNOSIS — I493 Ventricular premature depolarization: Secondary | ICD-10-CM | POA: Diagnosis not present

## 2024-02-03 DIAGNOSIS — I7121 Aneurysm of the ascending aorta, without rupture: Secondary | ICD-10-CM | POA: Diagnosis not present

## 2024-02-03 DIAGNOSIS — E669 Obesity, unspecified: Secondary | ICD-10-CM | POA: Insufficient documentation

## 2024-02-03 DIAGNOSIS — Z79899 Other long term (current) drug therapy: Secondary | ICD-10-CM | POA: Insufficient documentation

## 2024-02-03 DIAGNOSIS — I130 Hypertensive heart and chronic kidney disease with heart failure and stage 1 through stage 4 chronic kidney disease, or unspecified chronic kidney disease: Secondary | ICD-10-CM | POA: Insufficient documentation

## 2024-02-03 MED ORDER — FUROSEMIDE 20 MG PO TABS: 20.0000 mg | ORAL_TABLET | ORAL | 2 refills | Status: DC

## 2024-02-03 NOTE — Patient Instructions (Signed)
 CHANGE Lasix to 20 mg every other day.  Your physician recommends that you schedule a follow-up appointment in: 6 months.  If you have any questions or concerns before your next appointment please send [place] a message through [place] or call our office at [phone].    TO LEAVE A MESSAGE FOR THE NURSE SELECT OPTION 2, PLEASE LEAVE A MESSAGE INCLUDING: YOUR NAME DATE OF BIRTH CALL BACK NUMBER REASON FOR CALL**this is important as we prioritize the call backs  YOU WILL RECEIVE A CALL BACK THE SAME DAY AS LONG AS YOU CALL BEFORE 4:00 PM  At the [facility], you and your health needs are our priority. As part of our continuing mission to provide you with exceptional heart care, we have created designated Provider Care Teams. These Care Teams include your primary Cardiologist (physician) and Advanced Practice Providers (APPs- Physician Assistants and Nurse Practitioners) who all work together to provide you with the care you need, when you need it.   You may see any of the following providers on your designated Care Team at your next follow up: Dr Arvilla Meres Dr Marca Ancona Dr. Dorthula Nettles Dr. Clearnce Hasten Amy Filbert Schilder, NP Robbie Lis, [place] [facility] [place], [place] Brynda Peon, NP [place] Lee, NP Clarisa Kindred, NP Karle Plumber, PharmD Enos Fling, PharmD   Please be sure to bring in all your medications bottles to every appointment.    Thank you for choosing [facility]

## 2024-02-04 NOTE — Progress Notes (Addendum)
 PCP: Pincus Sanes, MD HF Cardiology: Dr. Shirlee Latch  Chief complaint: CHF  78 y.o. with history of HTN, OSA, and peripheral edema was referred by Dr. Lawerance Bach for evaluation of CHF.  In early 2024, he began to note significant peripheral edema. He also noted dyspnea with moderate exertion.  He was started on Lasix 20 mg daily by his PCP.  He has continued to have peripheral edema with some improvement with addition of compression stockings.  At initial appointment, I started him on Farxiga.   Cardiolite in 12/24 showed EF 72%, no ischemia/infarction.   Patient returns today for followup of diastolic CHF.  He is doing well.  Weight down 16 lbs.  No exertional dyspnea or chest pain.  He goes to the gym 3 times/week.  Knees hurt on the treadmill, but he can use a recumbent bike with no problems to go 5 miles.  No orthopnea/PND.  Using CPAP at night. No lightheadedness/syncope though HR remains low.   ECG (personally reviewed):  NSR, 1st degree AVB, RBBB   Labs (8/24): LDL 55, BNP 146 => 12.5, K 5.4, creatinine 1.63 Labs (9/24): K 4.3, creatinine 1.55 Labs (3/25): pro-BNP 32, K 4.7, creatinine 1.35, LDL 70  PMH: 1. GERD 2. HTN 3. OSA: uses CPAP 4. Chronic diastolic CHF: Echo (7/24) with EF 65-70%, normal RV size and systolic function, no MR, no aortic stenosis.  - Cardiolite (12/24): EF 72%, no ischemia/infarction.  5. H/o lung nodules.  6. Dilated ascending aorta: 4.1 cm on 6/24 CT chest.  7. CKD stage 3  Social History   Socioeconomic History   Marital status: Divorced    Spouse name: Not on file   Number of children: 1   Years of education: 14   Highest education level: Not on file  Occupational History   Occupation: RETIRED  Tobacco Use   Smoking status: Former    Current packs/day: 0.00    Types: Cigarettes    Start date: 05/1963    Quit date: 05/2008    Years since quitting: 15.7   Smokeless tobacco: Never   Tobacco comments:    quit in 2009  Vaping Use   Vaping status:  Never Used  Substance and Sexual Activity   Alcohol use: Not Currently    Alcohol/week: 1.0 standard drink of alcohol    Types: 1 Cans of beer per week    Comment: every month or 2 months.    Drug use: Not Currently    Comment: Marijuana long time ago it was occassional smoking,   Sexual activity: Not Currently  Other Topics Concern   Not on file  Social History Narrative   Diet:  No      Do you drink/ eat things with caffeine? Yes      Marital status:      Single                         What year were you married ? 1967      Do you live in a house, apartment,assistred living, condo, trailer, etc.)? House      Is it one or more stories? No      How many persons live in your home ?  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  No                            Type & how often       ADVANCED DIRECTIVES (Please bring copies)      Do you have a living will? Yes      Do you have a DNR form?   Yes                    If not, do you want to discuss one?       Do you have signed POA?HPOA forms?   Yes              If so, please bring to your appointment      FUNCTIONAL STATUS- To be completed by Spouse / child / Staff       Do you have difficulty bathing or dressing yourself ? No      Do you have difficulty preparing food or eating ? No      Do you have difficulty managing your mediation ? No      Do you have difficulty managing your finances ? No      Do you have difficulty affording your medication ? No            Right Handed    Lives in a one story home alone       Social Drivers of Health   Financial Resource Strain: Low Risk  (08/21/2022)   Overall Financial Resource Strain (CARDIA)    Difficulty of Paying Living Expenses: Not hard at all  Food Insecurity: No Food Insecurity (06/09/2023)   Hunger Vital  Sign    Worried About Running Out of Food in the Last Year: Never true    Ran Out of Food in the Last Year: Never true  Transportation Needs: No Transportation Needs (12/11/2023)   PRAPARE - Administrator, Civil Service (Medical): No    Lack of Transportation (Non-Medical): No  Physical Activity: Insufficiently Active (12/11/2023)   Exercise Vital Sign    Days of Exercise per Week: 3 days    Minutes of Exercise per Session: 30 min  Stress: No Stress Concern Present (12/11/2023)   [name] of Occupational Health - Occupational Stress Questionnaire    Feeling of Stress : Not at all  Social Connections: Socially Isolated (12/11/2023)   Social Connection and Isolation Panel [NHANES]    Frequency of Communication with Friends and Family: Three times a week    Frequency of Social Gatherings with Friends and Family: Once a week    Attends Religious Services: Never    Database administrator or Organizations: No    Attends Banker Meetings: Never    Marital Status: Divorced  Catering manager Violence: Patient Unable To Answer (12/11/2023)   Humiliation, Afraid, Rape, and Kick questionnaire    Fear of Current or Ex-Partner: Patient unable to answer    Emotionally Abused: Patient unable to answer    Physically Abused: Patient unable to answer    Sexually Abused: Patient unable to answer   Family History  Problem Relation Age of Onset   Hepatitis C Mother 32   Stomach cancer Father 68   Von Willebrand disease Sister    Autoimmune disease Sister    Autoimmune disease Brother 60   Colon polyps Brother  Colon cancer Maternal Grandmother    Anuerysm Son 75   Esophageal cancer Neg Hx    ROS: All systems reviewed and negative except as per HPI.   Current Outpatient Medications  Medication Sig Dispense Refill   acetaminophen (TYLENOL) 500 MG tablet Take 1,000 mg by mouth as needed for moderate pain.     FARXIGA 10 MG TABS tablet TAKE 1 TABLET BY MOUTH ONCE  DAILY BEFORE BREAKFAST 90 tablet 2   metoprolol tartrate (LOPRESSOR) 25 MG tablet Take 0.5 tablets (12.5 mg total) by mouth 2 (two) times daily. 90 tablet 1   omeprazole (PRILOSEC) 20 MG capsule Take 1 capsule (20 mg total) by mouth daily. 90 capsule 3   spironolactone (ALDACTONE) 25 MG tablet Take 0.5 tablets (12.5 mg total) by mouth daily. 45 tablet 3   triamcinolone cream (KENALOG) 0.1 % Apply 1 Application topically as needed.     furosemide (LASIX) 20 MG tablet Take 1 tablet (20 mg total) by mouth every other day. 30 tablet 2   No current facility-administered medications for this encounter.   BP 104/64   Pulse (!) 48   Wt 102.4 kg (225 lb 12.8 oz)   SpO2 98%   BMI 31.49 kg/m  General: NAD Neck: No JVD, no thyromegaly or thyroid nodule.  Lungs: Clear to auscultation bilaterally with normal respiratory effort. CV: Nondisplaced PMI.  Heart regular S1/S2, no S3/S4, no murmur.  1+ ankle edema.  No carotid bruit.  Normal pedal pulses.  Abdomen: Soft, nontender, no hepatosplenomegaly, no distention.  Skin: Intact without lesions or rashes.  Neurologic: Alert and oriented x 3.  Psych: Normal affect. Extremities: No clubbing or cyanosis.  HEENT: Normal.   Assessment/Plan: 1. Chronic diastolic CHF: Echo in 7/24 showed EF 65-70%, normal RV size and systolic function, no MR, no aortic stenosis. Cardiolite in 12/24 with no ischemia/infarction.  NYHA class II, not volume overloaded on exam.   - I think he can decrease Lasix to 20 mg every other day.  - Continue Farxiga 10 mg daily.  - Continue spironolactone 12.5 daily, recent BMET stable.  - Continue to wear compression stockings.   2. CKD stage 3: Continue Farxiga.  3. HTN: BP is currently controlled.  4. OSA: Continue CPAP.  5. Ascending aortic aneurysm: 4.1 cm (mildly dilated) on 6/24 CT.  6. Obesity: He continues to lose weight steadily.   Followup 6 months with APP  I spent 21 minutes reviewing records, interviewing/examining  patient, and managing orders.   Marca Ancona 02/04/2024

## 2024-02-09 ENCOUNTER — Encounter: Payer: Self-pay | Admitting: Oncology

## 2024-02-09 ENCOUNTER — Inpatient Hospital Stay: Attending: Oncology | Admitting: Oncology

## 2024-02-09 ENCOUNTER — Other Ambulatory Visit: Payer: Self-pay

## 2024-02-09 ENCOUNTER — Inpatient Hospital Stay

## 2024-02-09 VITALS — BP 128/64 | HR 55 | Temp 98.1°F | Resp 18 | Wt 240.9 lb

## 2024-02-09 DIAGNOSIS — Z860100 Personal history of colon polyps, unspecified: Secondary | ICD-10-CM | POA: Insufficient documentation

## 2024-02-09 DIAGNOSIS — D751 Secondary polycythemia: Secondary | ICD-10-CM | POA: Insufficient documentation

## 2024-02-09 DIAGNOSIS — Z87891 Personal history of nicotine dependence: Secondary | ICD-10-CM | POA: Insufficient documentation

## 2024-02-09 DIAGNOSIS — K760 Fatty (change of) liver, not elsewhere classified: Secondary | ICD-10-CM | POA: Insufficient documentation

## 2024-02-09 DIAGNOSIS — G4733 Obstructive sleep apnea (adult) (pediatric): Secondary | ICD-10-CM | POA: Diagnosis not present

## 2024-02-09 DIAGNOSIS — Z79899 Other long term (current) drug therapy: Secondary | ICD-10-CM | POA: Diagnosis not present

## 2024-02-09 DIAGNOSIS — I10 Essential (primary) hypertension: Secondary | ICD-10-CM | POA: Insufficient documentation

## 2024-02-09 DIAGNOSIS — D696 Thrombocytopenia, unspecified: Secondary | ICD-10-CM | POA: Diagnosis not present

## 2024-02-09 DIAGNOSIS — E669 Obesity, unspecified: Secondary | ICD-10-CM | POA: Insufficient documentation

## 2024-02-09 LAB — CMP (CANCER CENTER ONLY)
ALT: 9 U/L (ref 0–44)
AST: 12 U/L — ABNORMAL LOW (ref 15–41)
Albumin: 4.1 g/dL (ref 3.5–5.0)
Alkaline Phosphatase: 63 U/L (ref 38–126)
Anion gap: 6 (ref 5–15)
BUN: 20 mg/dL (ref 8–23)
CO2: 29 mmol/L (ref 22–32)
Calcium: 8.9 mg/dL (ref 8.9–10.3)
Chloride: 106 mmol/L (ref 98–111)
Creatinine: 1.06 mg/dL (ref 0.61–1.24)
GFR, Estimated: >60 mL/min (ref >60)
Glucose, Bld: 95 mg/dL (ref 70–99)
Potassium: 4.1 mmol/L (ref 3.5–5.1)
Sodium: 141 mmol/L (ref 135–145)
Total Bilirubin: 0.6 mg/dL (ref 0.0–1.2)
Total Protein: 6.7 g/dL (ref 6.5–8.1)

## 2024-02-09 LAB — CBC WITH DIFFERENTIAL (CANCER CENTER ONLY)
Abs Immature Granulocytes: 0.01 10*3/uL (ref 0.00–0.07)
Basophils Absolute: 0 10*3/uL (ref 0.0–0.1)
Basophils Relative: 0 %
Eosinophils Absolute: 0.1 10*3/uL (ref 0.0–0.5)
Eosinophils Relative: 1 %
HCT: 52.6 % — ABNORMAL HIGH (ref 39.0–52.0)
Hemoglobin: 17.4 g/dL — ABNORMAL HIGH (ref 13.0–17.0)
Immature Granulocytes: 0 %
Lymphocytes Relative: 26 %
Lymphs Abs: 1.9 10*3/uL (ref 0.7–4.0)
MCH: 30.7 pg (ref 26.0–34.0)
MCHC: 33.1 g/dL (ref 30.0–36.0)
MCV: 92.8 fL (ref 80.0–100.0)
Monocytes Absolute: 0.5 10*3/uL (ref 0.1–1.0)
Monocytes Relative: 7 %
Neutro Abs: 4.9 10*3/uL (ref 1.7–7.7)
Neutrophils Relative %: 66 %
Platelet Count: 105 10*3/uL — ABNORMAL LOW (ref 150–400)
RBC: 5.67 MIL/uL (ref 4.22–5.81)
RDW: 13.9 % (ref 11.5–15.5)
WBC Count: 7.4 10*3/uL (ref 4.0–10.5)
nRBC: 0 % (ref 0.0–0.2)

## 2024-02-09 LAB — FERRITIN: Ferritin: 92 ng/mL (ref 24–336)

## 2024-02-09 LAB — IRON AND TIBC
Iron: 102 ug/dL (ref 45–182)
Saturation Ratios: 32 % (ref 17.9–39.5)
TIBC: 319 ug/dL (ref 250–450)
UIBC: 217 ug/dL

## 2024-02-09 LAB — LACTATE DEHYDROGENASE: LDH: 112 U/L (ref 98–192)

## 2024-02-09 LAB — FOLATE: Folate: 21 ng/mL (ref >5.9)

## 2024-02-09 LAB — VITAMIN B12: Vitamin B-12: 216 pg/mL (ref 180–914)
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
## 2024-02-09 NOTE — Assessment & Plan Note (Signed)
 Platelet count fluctuates between 110,000 and 120,000, below normal range.  Fatty liver and medications may be contributing to thrombocytopenia. He denies bleeding issues.  Will continue to monitor for now.

## 2024-02-09 NOTE — Assessment & Plan Note (Signed)
 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    More recently, labs on 02/01/2024 showed hemoglobin of 18.9, hematocrit of 55.9 and hence a referral was sent to [place].    Differential diagnosis includes primary polycythemia, potentially due to a JAK2 mutation, and secondary polycythemia, possibly related to smoking, sleep apnea, or pulmonary conditions such as silicosis with nodules.   Phlebotomy has previously provided symptomatic relief. Risks of thrombosis or stroke due to increased blood viscosity were discussed.   He is not on medications or supplements contributing to the condition.   Blood tests, including JAK2 mutation and erythropoietin levels, will help determine the cause. Phlebotomy is indicated if hematocrit exceeds 55.   If JAK2 mutation is positive, more frequent phlebotomy may be required to maintain hematocrit below 45 due to increased risk of complications.  Labs today showed improved hemoglobin of 17.4, hematocrit better at 52.6.  White count 7400 with normal differential.  Platelet count stable at 105,000.  CMP, LDH, iron studies, B12, folate are all within normal limits.  No indication for therapeutic phlebotomy currently.  Will arrange for lab only visit in 6 weeks to check H&H.  - Schedule follow-up phone call in two weeks to discuss remaining test results  Will plan to see him in 3 months for follow-up with repeat labs.

## 2024-02-10 ENCOUNTER — Telehealth: Payer: Self-pay | Admitting: Oncology

## 2024-02-10 LAB — ERYTHROPOIETIN: Erythropoietin: 15.3 m[IU]/mL (ref 2.6–18.5)

## 2024-02-10 NOTE — Telephone Encounter (Signed)
 Patient has been scheduled for follow-up visit per 02/09/24 LOS.  Pt aware of appt details

## 2024-02-16 LAB — CALR +MPL + E12-E15  (REFLEX)

## 2024-02-16 LAB — JAK2 V617F RFX CALR/MPL/E12-15

## 2024-02-23 ENCOUNTER — Inpatient Hospital Stay: Admitting: Oncology

## 2024-02-23 ENCOUNTER — Encounter: Payer: Self-pay | Admitting: Oncology

## 2024-02-23 DIAGNOSIS — G4733 Obstructive sleep apnea (adult) (pediatric): Secondary | ICD-10-CM | POA: Diagnosis not present

## 2024-02-23 DIAGNOSIS — D696 Thrombocytopenia, unspecified: Secondary | ICD-10-CM

## 2024-02-23 DIAGNOSIS — D751 Secondary polycythemia: Secondary | ICD-10-CM | POA: Diagnosis not present

## 2024-02-23 NOTE — Progress Notes (Signed)
 [facility]  HEMATOLOGY-ONCOLOGY ELECTRONIC VISIT PROGRESS NOTE  PATIENT NAME: Justin Padilla   MR#: [id number] DOB: [birth year]  DATE OF SERVICE: 02/23/2024  Patient Care Team: Colene Dauphin, MD as PCP - General (Internal Medicine) Jann Melody, MD as PCP - Cardiology (Cardiology) Patel, Donika K, DO as Consulting Physician (Neurology)  I connected with the patient via telephone conference and verified that I am speaking with the correct person using two identifiers. The patient's location is at home and I am providing care from the [facility].  I discussed the limitations, risks, security and privacy concerns of performing an evaluation and management service by e-visits and the availability of in person appointments.  I also discussed with the patient that there may be a patient responsible charge related to this service. The patient expressed understanding and agreed to proceed.   ASSESSMENT & PLAN:   Justin Padilla is a 78 y.o.  gentleman with past medical history of GERD, fatty liver, hypertension, obstructive sleep apnea on CPAP, silicosis, was referred to our clinic in April 2025 for evaluation of polycythemia.  JAK2, CALR, MPL, exon 12-15 mutations negative.  Clinical picture consistent with secondary polycythemia.  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    More recently, labs on 02/01/2024 showed hemoglobin of 18.9, hematocrit of 55.9 and hence a referral was sent to us .    On his initial consultation with us  on 02/09/2024, labs showed improved hemoglobin of 17.4, hematocrit better at 52.6.  White count 7400 with normal differential.  Platelet count stable at 105,000.  CMP, LDH, iron studies, B12, folate were all within normal limits.  Erythropoietin level was within normal limits.  JAK2 testing followed by reflex testing to include CALR, MPL, exon 12-15 mutations  were all negative, thus ruling out primary polycythemia.  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   Goal is to maintain hematocrit below 55 to avoid hyperviscosity related complications.  No indication for therapeutic phlebotomy currently.   Will arrange for lab only visit in 6 weeks to check H&H.   Will plan to see him in 3 months for follow-up with repeat labs.  Thrombocytopenia (HCC) Platelet count fluctuates between 110,000 and 120,000, below normal range.  Fatty liver and medications may be contributing to thrombocytopenia. He denies bleeding issues.  Will continue to monitor for now.    I discussed the assessment and treatment plan with the patient. The patient was provided an opportunity to ask questions and all were answered. The patient agreed with the plan and demonstrated an understanding of the instructions. The patient was advised to call back or seek an in-person evaluation if the symptoms worsen or if the condition fails to improve as anticipated.    I spent 11 minutes over the phone with the patient reviewing test results, discuss management and coordination/planning of care.  Arlo Berber, MD 02/23/2024 5:56 PM [facility] - A DEPT OF [facility]. [facility] [address] [place] [address] Dept: [phone] Dept Fax: [phone]   INTERVAL HISTORY:  Please see above for problem oriented charting.  The purpose of today's discussion is to explain recent lab results and to formulate plan of care.  Discussed the use of AI scribe software for clinical note transcription with the patient, who gave verbal consent to proceed.  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  He has a history of lung silicosis, which is contributing to his  erythrocytosis. A recent workup revealed a hemoglobin level of 17.4 and a hematocrit of 52.6, both below the target of 55. His white blood cell count was normal, while his platelet count was slightly low at 105,000, a level that has remained stable since 2020.  All chemistry panels, including liver and kidney function tests, electrolytes, iron studies, B12, and folic acid levels, were normal. Erythropoietin levels were also normal, and mutation testing for bone marrow disorders was negative, ruling out primary causes for his elevated red blood cell count.  No symptoms such as dizziness, lightheadedness, headaches, skin itching, chest congestion, shortness of breath, leg swelling, or redness.  SUMMARY OF HEMATOLOGY HISTORY:  On 02/01/2024, routine labs at his PCPs office showed hemoglobin of 18.9, hematocrit of 55.9.  White count was 8000 with normal differential.  Platelet count was decreased at 111,000.  He was referred to [place] for further evaluation of polycythemia.   On review of available records, he has had fluctuating polycythemia since August 2021 with hemoglobin ranging anywhere between 15.7-18.9.  Also has had chronic mild thrombocytopenia with platelet count in the range of 85,000-150,000.   He has had polycythemia since in his 30s and used to have regular phlebotomies to manage his polycythemia, with the last one being in 2016. He reports feeling better after phlebotomies, especially during the summer months when he was working in [employer] in [place]. The patient also reports having had a problem with passing blood in his urine when he was young, but this resolved after he was advised to drink half a can of beer daily. The patient quit smoking in 2009 and uses a CPAP machine for his sleep apnea. 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 He also has a fatty liver.   He denies intermittent headaches, shortness of breath on exertion, dizziness,  frequent leg cramps or chest pain.    He denies any history of blood clot.   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     More recently, labs on 02/01/2024 showed hemoglobin of 18.9, hematocrit of 55.9 and hence a referral was sent to [place].    On his initial consultation with [place] on 02/09/2024, labs showed improved hemoglobin of 17.4, hematocrit better at 52.6.  White count 7400 with normal differential.  Platelet count stable at 105,000.  CMP, LDH, iron studies, B12, folate were all within normal limits.  Erythropoietin level was within normal limits.  JAK2 testing followed by reflex testing to include CALR, MPL, exon 12-15 mutations were all negative, thus ruling out primary polycythemia.  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   Goal is to maintain hematocrit below 55 to avoid hyperviscosity related complications.  No indication for therapeutic phlebotomy currently.   Will arrange for lab only visit in 6 weeks to check H&H.   Will plan to see him in 3 months for follow-up with repeat labs.  REVIEW OF SYSTEMS:    Review of Systems - Oncology  All other pertinent systems were reviewed with the patient and are negative.  I have reviewed the past medical history, past surgical history, social history and family history with the  patient and they are unchanged from previous note.  ALLERGIES:  He is allergic to trazodone and nefazodone.  MEDICATIONS:  Current Outpatient Medications  Medication Sig Dispense Refill   acetaminophen (TYLENOL) 500 MG tablet Take 1,000 mg by mouth as needed for moderate pain.     FARXIGA 10 MG TABS tablet TAKE 1 TABLET BY MOUTH ONCE DAILY BEFORE BREAKFAST 90 tablet 2   furosemide (LASIX) 20 MG tablet Take 1 tablet (20 mg total) by mouth every other day. 30 tablet 2   metoprolol tartrate (LOPRESSOR) 25 MG tablet  Take 0.5 tablets (12.5 mg total) by mouth 2 (two) times daily. 90 tablet 1   omeprazole (PRILOSEC) 20 MG capsule Take 1 capsule (20 mg total) by mouth daily. 90 capsule 3   spironolactone (ALDACTONE) 25 MG tablet Take 0.5 tablets (12.5 mg total) by mouth daily. 45 tablet 3   triamcinolone cream (KENALOG) 0.1 % Apply 1 Application topically as needed. (Patient not taking: Reported on 02/09/2024)     No current facility-administered medications for this visit.    PHYSICAL EXAMINATION:    Onc Performance Status - 02/23/24 1700       ECOG Perf Status   ECOG Perf Status Fully active, able to carry on all pre-disease performance without restriction      KPS SCALE   KPS % SCORE Normal, no compliants, no evidence of disease             LABORATORY DATA:   I have reviewed the data as listed.  Recent Results (from the past 2160 hours)  Lipid panel     Status: None   Collection Time: 02/01/24  9:50 AM  Result Value Ref Range   Cholesterol 124 0 - 200 mg/dL    Comment: ATP III Classification       Desirable:  < 200 mg/dL               Borderline High:  200 - 239 mg/dL          High:  > = 161 mg/dL   Triglycerides 09.6 0.0 - 149.0 mg/dL    Comment: Normal:  <045 mg/dLBorderline High:  150 - 199 mg/dL   HDL 40.98 >11.91 mg/dL   VLDL 47.8 0.0 - 29.5 mg/dL   LDL Cholesterol 70 0 - 99 mg/dL   Total CHOL/HDL Ratio 3     Comment:                Men          Women1/2 Average Risk     3.4          3.3Average Risk          5.0          4.42X Average Risk          9.6          7.13X Average Risk          15.0          11.0                       NonHDL 81.29     Comment: NOTE:  Non-HDL goal should be 30 mg/dL higher than patient's LDL goal (i.e. LDL goal of < 70 mg/dL, would have non-HDL goal of < 100 mg/dL)  Hemoglobin A2Z     Status: None   Collection Time: 02/01/24  9:50 AM  Result Value Ref Range   Hgb A1c  MFr Bld 5.7 4.6 - 6.5 %    Comment: Glycemic Control Guidelines for People with  Diabetes:Non Diabetic:  <6%Goal of Therapy: <7%Additional Action Suggested:  >8%   Comprehensive metabolic panel     Status: Abnormal   Collection Time: 02/01/24  9:50 AM  Result Value Ref Range   Sodium 138 135 - 145 mEq/L   Potassium 4.7 3.5 - 5.1 mEq/L   Chloride 103 96 - 112 mEq/L   CO2 27 19 - 32 mEq/L   Glucose, Bld 97 70 - 99 mg/dL   BUN 21 6 - 23 mg/dL   Creatinine, Ser 2.95 0.40 - 1.50 mg/dL   Total Bilirubin 0.5 0.2 - 1.2 mg/dL   Alkaline Phosphatase 75 39 - 117 U/L   AST 14 0 - 37 U/L   ALT 8 0 - 53 U/L   Total Protein 7.4 6.0 - 8.3 g/dL   Albumin 4.6 3.5 - 5.2 g/dL   GFR 62.13 (L) >08.65 mL/min    Comment: Calculated using the CKD-EPI Creatinine Equation (2021)   Calcium 9.5 8.4 - 10.5 mg/dL  CBC with Differential/Platelet     Status: Abnormal   Collection Time: 02/01/24  9:50 AM  Result Value Ref Range   WBC 8.0 4.0 - 10.5 K/uL   RBC 5.99 (H) 4.22 - 5.81 Mil/uL   Hemoglobin 18.9 Repeated and verified X2. (HH) 13.0 - 17.0 g/dL   HCT 78.4 (H) 69.6 - 29.5 %   MCV 93.3 78.0 - 100.0 fl   MCHC 33.8 30.0 - 36.0 g/dL   RDW 28.4 13.2 - 44.0 %   Platelets 111.0 (L) 150.0 - 400.0 K/uL   Neutrophils Relative % 67.0 43.0 - 77.0 %   Lymphocytes Relative 23.0 12.0 - 46.0 %   Monocytes Relative 8.5 3.0 - 12.0 %   Eosinophils Relative 1.1 0.0 - 5.0 %   Basophils Relative 0.4 0.0 - 3.0 %   Neutro Abs 5.3 1.4 - 7.7 K/uL   Lymphs Abs 1.8 0.7 - 4.0 K/uL   Monocytes Absolute 0.7 0.1 - 1.0 K/uL   Eosinophils Absolute 0.1 0.0 - 0.7 K/uL   Basophils Absolute 0.0 0.0 - 0.1 K/uL  Brain natriuretic peptide     Status: None   Collection Time: 02/01/24  9:50 AM  Result Value Ref Range   Pro B Natriuretic peptide (BNP) 32.0 0.0 - 100.0 pg/mL  Folate     Status: None   Collection Time: 02/09/24 12:22 PM  Result Value Ref Range   Folate 21.0 >5.9 ng/mL    Comment: Performed at [facility], [address], [place], [place] [address]  Vitamin B12     Status: None   Collection  Time: 02/09/24 12:22 PM  Result Value Ref Range   Vitamin B-12 216 180 - 914 pg/mL    Comment: (NOTE) This assay is not validated for testing neonatal or myeloproliferative syndrome specimens for Vitamin B12 levels. Performed at [facility], [address] [place], [place] [address]   JAK2 V617F rfx CALR/MPL/E12-15     Status: None   Collection Time: 02/09/24 12:22 PM  Result Value Ref Range   Specimen Type Comment:     Comment: NOT PROVIDED CORRECTED ON 04/08 AT 1537: PREVIOUSLY REPORTED AS DRAWN BY RN    JAK2 V617F Result Comment     Comment: (NOTE) NEGATIVE The JAK2 V617F mutation is not detected in the provided specimen of this individual. Results should be interpreted in conjunction with clinical  and other laboratory findings for the most accurate interpretation. This test was developed and its performance characteristics determined by [facility]. It has not been cleared or approved by the Food and Drug Administration.    Reflex Comment     Comment: (NOTE) Reflex to CALR Mutation Analysis, JAK2 Exon 12-15 Mutation Analysis, and MPL Mutation Analysis is indicated.    V617F Rfx CALR/MPL/E12-15 Bkgd Comment     Comment: (NOTE)    Molecular testing of blood or bone marrow is useful in the evaluation of suspected myeloproliferative neoplasms (MPN). Mutations in the JAK2, MPL, and CALR genes are present in virtually all MPNs and their presence help distinguish benign reactive processes from clonal neoplasms. These mutations are generally considered mutually exclusive, although concurrent clones have been reported in rare patients. This test will assess for the JAK2V617F (exon 14) mutation first and will reflex to CALR mutation analysis, MPL mutation analysis, and JAK2 exon 12 to 15 mutation analysis if the JAK2V617F mutation is negative.    The JAK2 (Janus kinase 2) gene encodes for a non-receptor protein tyrosine kinase that activates cytokine and growth  factor signaling. The V617F (c.1849 G>T) mutation results in constitutive activation of JAK2 and downstream STAT5 and ERK signaling. The V617F mutation is observed in approximately 95% of polycythemia vera (PV), 60% of essential thromboc ythemia (ET) and primary myelofibrosis (PMF). It is also infrequently present (3-5%) in myelodysplastic syndrome, chronic myelomonocytic leukemia, and other atypical chronic myeloid disorders. A small percentage of JAK2 mutation positive patients (3.3%) contain other non-V617F mutations within exons 12 to 15. In particular, mutations in exon 12 of JAK2 have been described in approximately 3% of patients with PV. JAK2 allele burden correlates with clinical phenotype, with low levels of mutant allele characterized by thrombocytosis, intermediate levels with erythrocytosis, and high mutant allele burden correlating with enhanced myelopoiesis of the BM, leukocytosis, increasing spleen size, and circulating CD34-positive cells.    The CALR (Calreticulin) gene encodes for a multifunctional calcium-binding protein involved in many cellular activities such as growth, proliferation, adhesion, and programmed cell death. Among patients with JAK2 negative MPNs, CALR are found in a pproximately 70% of patients with JAK2-negative essential thrombocythemia (ET) and 60-88% of patients with JAK2-negative primary myelofibrosis(PMF). Only a minority of patients (approximately 8%) with myelodysplasia have mutations in the CALR gene. CALR mutations are rarely detected in patients with de novo acute myeloid leukemia, chronic myelogenous leukemia, lymphoid leukemia, or solid tumors. CALR mutations are not detected in polycythemia and generally appear to be mutually exclusive with JAK2 mutations and MPL mutations. The majority of mutational changes involve a variety of insertion deletion mutations in exon 9 of the calreticulin gene: approximately 53% of all CALR mutations  are a 52 bp deletion (type-1) while the second most prevalent mutation (approximately 32%) contains a 5 bp insertion (type-2). Other mutations (non-type 1 or type 2) are seen in a small minority of cases. CALR mutations in PMF tend to be with a favorable prognosis compared to JAK2 V617F [employer], whereas primary myelofibrosis negative for CALR, JAK2 V617F and MPL mutations (so-called triple negative) is associated with a poor prognosis and shorter survival.    The MPL (myeloproliferative leukemia virus oncogene) gene encodes the thrombopoietin receptor which regulates hematopoiesis and megakaryopoiesis. Activating MPL mutations are associated with a subset of myeloproliferative neoplasms and acute megakaryoblastic leukemia. MPL W515 mutations are present in approximately 5-8% of patients with primary myelofibrosis (PMF) and 1-4% of patients with essential thrombocythemia (ET). The S505 mutation is detected  in patients with hereditary thrombocythemia.    Limitations    This assay has a sensitivity of approximately 1% VAF for JAK2 V617F, 2.5% VAF for other mutations in JAK2 exons 12 to 15, CALR mutations, and MPL mutations. Deletions in JAK2 up to 6 bp and insertions up to 34 bp have been detected in validation studies. Deletions in CALR up to 70 bp and i nsertions up to 12 bp have been detected in validation studies.    Method based next generation sequencing.     Comment: Comment [employer]    References Comment     Comment: (NOTE) Alghasham N, Alnouri Y, Abalkhail H, Wilfredo Hanly. Detection of mutations in JAK2 exons 12-15 by [employer] sequencing. Int J Lab Hematol. 2016 Feb;38(1):34-41. doi: 10.1111/ijlh.24401. Epub 2015 Sep 11. PMID: 02725366. Rana Burr, Charlie Constant, Hasserjian R, Serafin Dames, Borowitz MJ, Gina Lagos MM, [place] CD, Cazzola M, Vardiman JW. The 2016 revision to the World Health Organization classification of myeloid neoplasms and acute leukemia. Blood. 2016 May  19;127(20):2391-405. doi: 10.1182/blood-2016-03-643544. Epub 2016 Apr 11. PMID: 44034742. Asa Bjork [facility], Zhang ZJ, Warthen S, Albitar M. Mutation profile of JAK2 transcripts in patients with chronic myeloproliferative neoplasias. J Mol Diagn. 2009 Jan;11(1):49-53.doi: 10.2353/jmoldx.2009.080114. Epub 2008 Dec 12. PMID: 59563875; PMCID: IEP3295188. NCCN Clinical Practice Guidelines in Oncology (NCCN Guidelines) Myeloproliferative Neoplasms Version 3.2022 - June 20, 2021. Swerdlow SH, Programmer, multimedia. WHO classif ication of Tumours of Haematopoietic and Lymphoid Tissues. 4th edn. Johanna Mutter, [place]: Geologist, engineering for [employer] on [employer]; 2017. Tefferi A. Primary myelofibrosis: 2021 update on diagnosis, risk-stratification and management. Am J Hematol. 2021 Jan;96(1):145-162. doi: 10.1002/ajh.26050. Epub 2020 Dec 2. PMID: 41660630. Vainchenker W, Kralovics R. Genetic basis and molecular pathophysiology of classical myeloproliferative neoplasms. Blood. 2017 Feb 9;129(6):667-679. doi: 10.1182/blood-2016-10-695940. Epub 2016 Dec 27. PMID: 16010932.    Director Review Comment     Comment: (NOTE) Technical Component performed at [employer] Component performed by: Lauri Poot, PhD, [facility] Director, Molecular Oncology [facility] [place], [address] [place] [address] [phone] Performed At: [facility] [address] [place], [place] [id number] Adams Adams MDPhD VO:[id number] Performed At: [facility] [address] [place], [place] [id number] Adams Adams MDPhD OJ:[id number]   Erythropoietin     Status: None   Collection Time: 02/09/24 12:22 PM  Result Value Ref Range   Erythropoietin 15.3 2.6 - 18.5 mIU/mL    Comment: (NOTE) [employer] DxI 800 Immunoassay System Values obtained with different assay methods or kits cannot be used interchangeably. Results cannot be interpreted as absolute evidence of the  presence or absence of malignant disease. Performed At: [facility] [address] [place], [place] [id number] Pearlean Botts MD LF:[id number]   Lactate dehydrogenase     Status: None   Collection Time: 02/09/24 12:22 PM  Result Value Ref Range   LDH 112 98 - 192 U/L    Comment: Performed at [facility], [address] [place], [place] [address]  Ferritin     Status: None   Collection Time: 02/09/24 12:22 PM  Result Value Ref Range   Ferritin 92 24 - 336 ng/mL    Comment: Performed at [facility], [address] [place], [place] [address]  Iron and TIBC     Status: None   Collection Time: 02/09/24 12:22 PM  Result Value Ref Range   Iron 102 45 - 182 ug/dL   TIBC 536 144 - 315 ug/dL  Saturation Ratios 32 17.9 - 39.5 %   UIBC 217 ug/dL    Comment: Performed at [facility], [address] [place], [place] [address]  CMP (Cancer Center only)     Status: Abnormal   Collection Time: 02/09/24 12:22 PM  Result Value Ref Range   Sodium 141 135 - 145 mmol/L   Potassium 4.1 3.5 - 5.1 mmol/L   Chloride 106 98 - 111 mmol/L   CO2 29 22 - 32 mmol/L   Glucose, Bld 95 70 - 99 mg/dL    Comment: Glucose reference range applies only to samples taken after fasting for at least 8 hours.   BUN 20 8 - 23 mg/dL   Creatinine 6.04 5.40 - 1.24 mg/dL   Calcium 8.9 8.9 - 98.1 mg/dL   Total Protein 6.7 6.5 - 8.1 g/dL   Albumin 4.1 3.5 - 5.0 g/dL   AST 12 (L) 15 - 41 U/L   ALT 9 0 - 44 U/L   Alkaline Phosphatase 63 38 - 126 U/L   Total Bilirubin 0.6 0.0 - 1.2 mg/dL   GFR, Estimated >19 >14 mL/min    Comment: (NOTE) Calculated using the CKD-EPI Creatinine Equation (2021)    Anion gap 6 5 - 15    Comment: Performed at [facility], [address] [place], [place] [address]  CBC with Differential (Cancer Center Only)     Status: Abnormal   Collection Time: 02/09/24 12:22 PM  Result Value Ref Range   WBC Count 7.4 4.0 -  10.5 K/uL   RBC 5.67 4.22 - 5.81 MIL/uL   Hemoglobin 17.4 (H) 13.0 - 17.0 g/dL   HCT 62.1 (H) 30.8 - 65.7 %   MCV 92.8 80.0 - 100.0 fL   MCH 30.7 26.0 - 34.0 pg   MCHC 33.1 30.0 - 36.0 g/dL   RDW 84.6 96.2 - 95.2 %   Platelet Count 105 (L) 150 - 400 K/uL   nRBC 0.0 0.0 - 0.2 %   Neutrophils Relative % 66 %   Neutro Abs 4.9 1.7 - 7.7 K/uL   Lymphocytes Relative 26 %   Lymphs Abs 1.9 0.7 - 4.0 K/uL   Monocytes Relative 7 %   Monocytes Absolute 0.5 0.1 - 1.0 K/uL   Eosinophils Relative 1 %   Eosinophils Absolute 0.1 0.0 - 0.5 K/uL   Basophils Relative 0 %   Basophils Absolute 0.0 0.0 - 0.1 K/uL   Immature Granulocytes 0 %   Abs Immature Granulocytes 0.01 0.00 - 0.07 K/uL    Comment: Performed at [facility], [address], [place], [place] [address]  CALR +MPL + E12-E15 (reflexed)     Status: None   Collection Time: 02/09/24 12:22 PM  Result Value Ref Range   CALR Result Comment     Comment: (NOTE) NEGATIVE No insertions or deletions were detected within the analyzed region of the calreticulin (CALR) gene. A negative result does not entirely exclude the possibility of a clonal population carrying CALR gene mutations that are not covered by this assay. Results should be interpreted in conjunction with clinical and laboratory findings for the most accurate interpretation.    MPL Result Comment     Comment: (NOTE) NEGATIVE No MPL mutation was identified in the provided specimen of this individual. Results should be interpreted in conjunction with clinical and other laboratory findings for the most accurate interpretation.    E12-15 Result Comment     Comment: (NOTE) NEGATIVE  JAK2 mutations were not detected in exons 12, 13, 14 and 15. The G to T nucleotide change encoding the V617F mutation was not detected. This result does not rule out the presence of JAK2 mutation at a level below the detection sensitivity of this assay, the presence of other  mutations outside the analyzed region of the JAK2 gene, or the presence of a myeloproliferative or other neoplasm. Result must be correlated with other clinical data for the most accurate diagnosis. Performed At: [facility] [address] [place], [place] [id number] Adams Adams MDPhD WG:[id number]      RADIOGRAPHIC STUDIES:  No recent pertinent imaging studies available to review.  No orders of the defined types were placed in this encounter.    Future Appointments  Date Time Provider Department Center  03/22/2024 11:30 AM [facility] PHLEBOTOMIST [facility] None  05/16/2024  2:00 PM [facility] PHLEBOTOMIST [facility] None  05/16/2024  2:30 PM Jaycob Mcclenton, Gale Jude, MD [facility] None  06/02/2024  1:00 PM Rowan Cooter, PhD [facility] None  06/02/2024  2:00 PM [facility]- NEUROPSYCH TECH [facility] None  06/09/2024  2:30 PM Rowan Cooter, PhD [facility] None  06/13/2024  1:30 PM Reyna Cava K, DO [facility] None  07/14/2024 11:30 AM [facility] PA/NP [facility] None  08/03/2024  1:00 PM Burns, Beckey Bourgeois, MD [facility] None    This document was completed utilizing speech recognition software. Grammatical errors, random word insertions, pronoun errors, and incomplete sentences are an occasional consequence of this system due to software limitations, ambient noise, and hardware issues. Any formal questions or concerns about the content, text or information contained within the body of this dictation should be directly addressed to the provider for clarification.

## 2024-02-23 NOTE — Assessment & Plan Note (Signed)
 Platelet count fluctuates between 110,000 and 120,000, below normal range.  Fatty liver and medications may be contributing to thrombocytopenia. He denies bleeding issues.  Will continue to monitor for now.

## 2024-02-23 NOTE — Assessment & Plan Note (Addendum)
 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    More recently, labs on 02/01/2024 showed hemoglobin of 18.9, hematocrit of 55.9 and hence a referral was sent to us .    On his initial consultation with us  on 02/09/2024, labs showed improved hemoglobin of 17.4, hematocrit better at 52.6.  White count 7400 with normal differential.  Platelet count stable at 105,000.  CMP, LDH, iron studies, B12, folate were all within normal limits.  Erythropoietin level was within normal limits.  JAK2 testing followed by reflex testing to include CALR, MPL, exon 12-15 mutations were all negative, thus ruling out primary polycythemia.  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   Goal is to maintain hematocrit below 55 to avoid hyperviscosity related complications.  No indication for therapeutic phlebotomy currently.   Will arrange for lab only visit in 6 weeks to check H&H.   Will plan to see him in 3 months for follow-up with repeat labs.

## 2024-02-25 ENCOUNTER — Ambulatory Visit: Payer: Medicare HMO | Admitting: Podiatry

## 2024-02-25 DIAGNOSIS — G4733 Obstructive sleep apnea (adult) (pediatric): Secondary | ICD-10-CM | POA: Diagnosis not present

## 2024-02-28 ENCOUNTER — Other Ambulatory Visit: Payer: Self-pay | Admitting: Internal Medicine

## 2024-03-17 ENCOUNTER — Telehealth (HOSPITAL_COMMUNITY): Payer: Self-pay | Admitting: Internal Medicine

## 2024-03-17 NOTE — Telephone Encounter (Signed)
 I called to schedule echocardiogram per order from Dr. Paulita Boss. Spoke with niece and she states that patient does not follow Chandrasekhar and anymore and following Dr. Mitzie Anda. She will not schedule echo.  She states she was not happy with MD and will not be needing to schedule due to Dr. Rhys Chaco will order any testing needed. Order will be removed fro the echo WQ. Thank you.

## 2024-03-22 ENCOUNTER — Inpatient Hospital Stay: Attending: Oncology

## 2024-03-22 DIAGNOSIS — D751 Secondary polycythemia: Secondary | ICD-10-CM | POA: Diagnosis not present

## 2024-03-22 DIAGNOSIS — Z87891 Personal history of nicotine dependence: Secondary | ICD-10-CM | POA: Diagnosis not present

## 2024-03-22 LAB — HEMOGLOBIN AND HEMATOCRIT (CANCER CENTER ONLY)
HCT: 53.8 % — ABNORMAL HIGH (ref 39.0–52.0)
Hemoglobin: 18 g/dL — ABNORMAL HIGH (ref 13.0–17.0)

## 2024-03-24 DIAGNOSIS — G4733 Obstructive sleep apnea (adult) (pediatric): Secondary | ICD-10-CM | POA: Diagnosis not present

## 2024-04-24 DIAGNOSIS — G4733 Obstructive sleep apnea (adult) (pediatric): Secondary | ICD-10-CM | POA: Diagnosis not present

## 2024-05-10 ENCOUNTER — Other Ambulatory Visit: Payer: Self-pay | Admitting: Oncology

## 2024-05-10 DIAGNOSIS — D751 Secondary polycythemia: Secondary | ICD-10-CM

## 2024-05-16 ENCOUNTER — Encounter: Payer: Self-pay | Admitting: Oncology

## 2024-05-16 ENCOUNTER — Inpatient Hospital Stay: Admitting: Oncology

## 2024-05-16 ENCOUNTER — Inpatient Hospital Stay: Attending: Oncology

## 2024-05-16 VITALS — BP 108/67 | HR 63 | Temp 98.3°F | Resp 17 | Ht 66.0 in | Wt 226.7 lb

## 2024-05-16 DIAGNOSIS — D696 Thrombocytopenia, unspecified: Secondary | ICD-10-CM | POA: Insufficient documentation

## 2024-05-16 DIAGNOSIS — K760 Fatty (change of) liver, not elsewhere classified: Secondary | ICD-10-CM | POA: Diagnosis not present

## 2024-05-16 DIAGNOSIS — D751 Secondary polycythemia: Secondary | ICD-10-CM

## 2024-05-16 LAB — CMP (CANCER CENTER ONLY)
ALT: 8 U/L (ref 0–44)
AST: 19 U/L (ref 15–41)
Albumin: 4.5 g/dL (ref 3.5–5.0)
Alkaline Phosphatase: 83 U/L (ref 38–126)
Anion gap: 13 (ref 5–15)
BUN: 20 mg/dL (ref 8–23)
CO2: 25 mmol/L (ref 22–32)
Calcium: 9.8 mg/dL (ref 8.9–10.3)
Chloride: 98 mmol/L (ref 98–111)
Creatinine: 1.38 mg/dL — ABNORMAL HIGH (ref 0.61–1.24)
GFR, Estimated: 52 mL/min — ABNORMAL LOW (ref >60)
Glucose, Bld: 81 mg/dL (ref 70–99)
Potassium: 4.3 mmol/L (ref 3.5–5.1)
Sodium: 136 mmol/L (ref 135–145)
Total Bilirubin: 0.7 mg/dL (ref 0.0–1.2)
Total Protein: 7.4 g/dL (ref 6.5–8.1)

## 2024-05-16 LAB — CBC WITH DIFFERENTIAL (CANCER CENTER ONLY)
Abs Immature Granulocytes: 0.01 K/uL (ref 0.00–0.07)
Basophils Absolute: 0 K/uL (ref 0.0–0.1)
Basophils Relative: 0 %
Eosinophils Absolute: 0 K/uL (ref 0.0–0.5)
Eosinophils Relative: 0 %
HCT: 55 % — ABNORMAL HIGH (ref 39.0–52.0)
Hemoglobin: 19 g/dL — ABNORMAL HIGH (ref 13.0–17.0)
Immature Granulocytes: 0 %
Lymphocytes Relative: 22 %
Lymphs Abs: 1.8 K/uL (ref 0.7–4.0)
MCH: 30.6 pg (ref 26.0–34.0)
MCHC: 34.5 g/dL (ref 30.0–36.0)
MCV: 88.7 fL (ref 80.0–100.0)
Monocytes Absolute: 0.6 K/uL (ref 0.1–1.0)
Monocytes Relative: 8 %
Neutro Abs: 5.6 K/uL (ref 1.7–7.7)
Neutrophils Relative %: 70 %
Platelet Count: 107 K/uL — ABNORMAL LOW (ref 150–400)
RBC: 6.2 MIL/uL — ABNORMAL HIGH (ref 4.22–5.81)
RDW: 13.3 % (ref 11.5–15.5)
WBC Count: 8 K/uL (ref 4.0–10.5)
nRBC: 0 % (ref 0.0–0.2)

## 2024-05-16 NOTE — Assessment & Plan Note (Addendum)
 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    More recently, labs on 02/01/2024 showed hemoglobin of 18.9, hematocrit of 55.9 and hence a referral was sent to us .    On his initial consultation with us  on 02/09/2024, labs showed improved hemoglobin of 17.4, hematocrit better at 52.6.  White count 7400 with normal differential.  Platelet count stable at 105,000.  CMP, LDH, iron studies, B12, folate were all within normal limits.  Erythropoietin  level was within normal limits.  JAK2 testing followed by reflex testing to include CALR, MPL, exon 12-15 mutations were all negative, thus ruling out primary polycythemia.  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   Goal is to maintain hematocrit below 55 to avoid hyperviscosity related complications.    Labs today showed hematocrit of 55.  Creatinine 1.38, likely dehydration.  No indication for therapeutic phlebotomy currently.   Will arrange for lab only visit in 6 weeks to check H&H and therapeutic phlebotomy.   Will plan to see him in 3 months for follow-up with repeat labs and possible therapeutic phlebotomy.

## 2024-05-16 NOTE — Assessment & Plan Note (Addendum)
 Platelet count fluctuates between 105,000 and 120,000, below normal range.  Fatty liver and medications may be contributing to thrombocytopenia. He denies bleeding issues.  Platelet count stable at 107,000 today.  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  Will continue to monitor for now.

## 2024-05-16 NOTE — Progress Notes (Signed)
 [facility]  HEMATOLOGY CLINIC PROGRESS NOTE  PATIENT NAME: Justin Padilla   MR#: [id number] DOB: [birth year]  Patient Care Team: Geofm Glade PARAS, MD as PCP - General (Internal Medicine) Santo Stanly LABOR, MD as PCP - Cardiology (Cardiology) Tobie Tonita POUR, DO as Consulting Physician (Neurology)  Date of visit: 05/16/2024   ASSESSMENT & PLAN:   Justin Padilla is a 78 y.o. gentleman with past medical history of GERD, fatty liver, hypertension, obstructive sleep apnea on CPAP, silicosis, was referred to our clinic in April 2025 for evaluation of polycythemia.  JAK2, CALR, MPL, exon 12-15 mutations negative.  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    Secondary polycythemia He has had polycythemia since in his 30s and used to have phlebotomies to manage polycythemia, last 1 in 2016.    More recently, labs on 02/01/2024 showed hemoglobin of 18.9, hematocrit of 55.9 and hence a referral was sent to us .    On his initial consultation with us  on 02/09/2024, labs showed improved hemoglobin of 17.4, hematocrit better at 52.6.  White count 7400 with normal differential.  Platelet count stable at 105,000.  CMP, LDH, iron studies, B12, folate were all within normal limits.  Erythropoietin  level was within normal limits.  JAK2 testing followed by reflex testing to include CALR, MPL, exon 12-15 mutations were all negative, thus ruling out primary polycythemia.  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   Goal is to maintain hematocrit below 55 to avoid hyperviscosity related complications.    Labs today showed hematocrit of 55.  Creatinine 1.38, likely dehydration.  No indication for therapeutic phlebotomy currently.   Will arrange for lab only visit in 6 weeks to check H&H and therapeutic phlebotomy.   Will plan to see him  in 3 months for follow-up with repeat labs and possible therapeutic phlebotomy.  Thrombocytopenia (HCC) Platelet count fluctuates between 105,000 and 120,000, below normal range.  Fatty liver and medications may be contributing to thrombocytopenia. He denies bleeding issues.  Platelet count stable at 107,000 today.  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  Will continue to monitor for now.     I spent a total of 20 minutes during this encounter with the patient including review of chart and various tests results, discussions about plan of care and coordination of care plan.  I reviewed lab results and outside records for this visit and discussed relevant results with the patient. Diagnosis, plan of care and treatment options were also discussed in detail with the patient. Opportunity provided to ask questions and answers provided to his apparent satisfaction. Provided instructions to call our clinic with any problems, questions or concerns prior to return visit. I recommended to continue follow-up with PCP and sub-specialists. He verbalized understanding and agreed with the plan. No barriers to learning was detected.  Chinita Patten, MD  05/16/2024 3:32 PM   [facility] A DEPT OF 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  INTERVAL HISTORY:  Discussed the use of AI scribe software for clinical note transcription with the patient, who gave verbal consent to proceed.  History of Present Illness Justin Padilla is a 78 year old male with polycythemia  who presents for routine follow-up and lab review.  Six weeks after his last visit, he underwent a lab check which did not necessitate a phlebotomy. His current blood counts are pending, but previous counts  showed a platelet count of 105,000 to 111,000, which is slightly low. His hemoglobin is 19, and hematocrit is 55.  No chest pain, trouble breathing, congestion, rash, or skin burning sensation. He denies unintentional weight loss or lack of appetite and mentions actively working on losing weight. He also denies headaches or dizziness.  His kidney function shows some fluctuation, with a creatinine level of 1.38 today, slightly higher than the previous 1.35 in March. He mentions doing yard work and going to [employer], which may have affected his hydration status.  He is currently on Lasix , which is noted to potentially impact his hydration and kidney function.   SUMMARY OF HEMATOLOGIC HISTORY:  On 02/01/2024, routine labs at his PCPs office showed hemoglobin of 18.9, hematocrit of 55.9.  White count was 8000 with normal differential.  Platelet count was decreased at 111,000.  He was referred to us  for further evaluation of polycythemia.   On review of available records, he has had fluctuating polycythemia since August 2021 with hemoglobin ranging anywhere between 15.7-18.9.  Also has had chronic mild thrombocytopenia with platelet count in the range of 85,000-150,000.   He has had polycythemia since in his 30s and used to have regular phlebotomies to manage his polycythemia, with the last one being in 2016. He reports feeling better after phlebotomies, especially during the summer months when he was working in [employer] in [place] . The patient also reports having had a problem with passing blood in his urine when he was young, but this resolved after he was advised to drink half a can of beer daily. The patient quit smoking in 2009 and uses a CPAP machine for his sleep apnea. 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 He also has a fatty liver.   He denies intermittent headaches, shortness of breath on exertion, dizziness, frequent leg cramps or chest  pain.    He denies any history of blood clot.    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     More recently, labs on 02/01/2024 showed hemoglobin of 18.9, hematocrit of 55.9 and hence a referral was sent to us .     On his initial consultation with us  on 02/09/2024, labs showed improved hemoglobin of 17.4, hematocrit better at 52.6.  White count 7400 with normal differential.  Platelet count stable at 105,000.  CMP, LDH, iron studies, B12, folate were all within normal limits.  Erythropoietin  level was within normal limits.  JAK2 testing followed by reflex testing to include CALR, MPL, exon 12-15 mutations were all negative, thus ruling out primary polycythemia.   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   Goal is to maintain hematocrit below 55 to avoid hyperviscosity related complications.  No indication for therapeutic phlebotomy currently.   I have reviewed the past medical history, past surgical history, social history and family history with the patient and they are unchanged from previous note.  ALLERGIES: He is allergic to trazodone  and nefazodone.  MEDICATIONS:  Current Outpatient Medications  Medication Sig Dispense Refill   FARXIGA  10 MG TABS tablet TAKE 1 TABLET BY MOUTH ONCE DAILY BEFORE BREAKFAST 90  tablet 2   furosemide  (LASIX ) 20 MG tablet Take 1 tablet (20 mg total) by mouth every other day. (Patient taking differently: Take 20 mg by mouth every other day. Patient taking daily per his cardiologist) 30 tablet 2   metoprolol  tartrate (LOPRESSOR ) 25 MG tablet Take 1/2 (one-half) tablet by mouth twice daily 90 tablet 0   omeprazole  (PRILOSEC) 20 MG capsule Take 1 capsule (20 mg total) by mouth daily. 90 capsule 3   spironolactone  (ALDACTONE ) 25 MG tablet Take 0.5 tablets (12.5 mg total) by mouth daily. 45 tablet 3   No current  facility-administered medications for this visit.     REVIEW OF SYSTEMS:    Review of Systems - Oncology  All other pertinent systems were reviewed with the patient and are negative.  PHYSICAL EXAMINATION:   Onc Performance Status - 05/16/24 1443       ECOG Perf Status   ECOG Perf Status Fully active, able to carry on all pre-disease performance without restriction      KPS SCALE   KPS % SCORE Normal, no compliants, no evidence of disease          Vitals:   05/16/24 1440  BP: 108/67  Pulse: 63  Resp: 17  Temp: 98.3 F (36.8 C)  SpO2: 95%   Filed Weights   05/16/24 1440  Weight: 226 lb 11.2 oz (102.8 kg)    Physical Exam Constitutional:      General: He is not in acute distress.    Appearance: Normal appearance.  HENT:     Head: Normocephalic and atraumatic.  Eyes:     Conjunctiva/sclera: Conjunctivae normal.  Cardiovascular:     Rate and Rhythm: Normal rate and regular rhythm.     Heart sounds: Normal heart sounds.  Pulmonary:     Effort: Pulmonary effort is normal. No respiratory distress.     Breath sounds: Normal breath sounds.  Abdominal:     General: There is no distension.  Neurological:     General: No focal deficit present.     Mental Status: He is alert and oriented to person, place, and time.  Psychiatric:        Mood and Affect: Mood normal.        Behavior: Behavior normal.     LABORATORY DATA:   I have reviewed the data as listed.  Results for orders placed or performed in visit on 05/16/24  CMP (Cancer Center only)  Result Value Ref Range   Sodium 136 135 - 145 mmol/L   Potassium 4.3 3.5 - 5.1 mmol/L   Chloride 98 98 - 111 mmol/L   CO2 25 22 - 32 mmol/L   Glucose, Bld 81 70 - 99 mg/dL   BUN 20 8 - 23 mg/dL   Creatinine 8.61 (H) 9.38 - 1.24 mg/dL   Calcium 9.8 8.9 - 89.6 mg/dL   Total Protein 7.4 6.5 - 8.1 g/dL   Albumin 4.5 3.5 - 5.0 g/dL   AST 19 15 - 41 U/L   ALT 8 0 - 44 U/L   Alkaline Phosphatase 83 38 - 126 U/L    Total Bilirubin 0.7 0.0 - 1.2 mg/dL   GFR, Estimated 52 (L) >60 mL/min   Anion gap 13 5 - 15  CBC with Differential (Cancer Center Only)  Result Value Ref Range   WBC Count 8.0 4.0 - 10.5 K/uL   RBC 6.20 (H) 4.22 - 5.81 MIL/uL   Hemoglobin 19.0 (H) 13.0 - 17.0 g/dL  HCT 55.0 (H) 39.0 - 52.0 %   MCV 88.7 80.0 - 100.0 fL   MCH 30.6 26.0 - 34.0 pg   MCHC 34.5 30.0 - 36.0 g/dL   RDW 86.6 88.4 - 84.4 %   Platelet Count 107 (L) 150 - 400 K/uL   nRBC 0.0 0.0 - 0.2 %   Neutrophils Relative % 70 %   Neutro Abs 5.6 1.7 - 7.7 K/uL   Lymphocytes Relative 22 %   Lymphs Abs 1.8 0.7 - 4.0 K/uL   Monocytes Relative 8 %   Monocytes Absolute 0.6 0.1 - 1.0 K/uL   Eosinophils Relative 0 %   Eosinophils Absolute 0.0 0.0 - 0.5 K/uL   Basophils Relative 0 %   Basophils Absolute 0.0 0.0 - 0.1 K/uL   Immature Granulocytes 0 %   Abs Immature Granulocytes 0.01 0.00 - 0.07 K/uL     RADIOGRAPHIC STUDIES:  No recent pertinent imaging studies available to review.  Orders Placed This Encounter  Procedures   Hemoglobin and Hematocrit (Cancer Center Only)    Standing Status:   Future    Expected Date:   06/27/2024    Expiration Date:   05/16/2025   CBC with Differential (Cancer Center Only)    Standing Status:   Future    Expected Date:   08/16/2024    Expiration Date:   11/14/2024   CMP (Cancer Center only)    Standing Status:   Future    Expected Date:   08/16/2024    Expiration Date:   11/14/2024     Future Appointments  Date Time Provider Department Center  06/02/2024  1:00 PM Richie Hussar, PhD [facility] None  06/02/2024  2:00 PM L[facility]- NEUROPSYCH TECH [facility] None  06/09/2024  2:30 PM Richie Hussar, PhD [facility] None  06/13/2024  1:30 PM Tobie Breslow K, DO [facility] None  06/27/2024 10:00 AM [facility] PHLEBOTOMIST [facility] None  07/14/2024 11:30 AM [facility] PA/NP [facility] None  08/03/2024  1:00 PM Geofm Glade PARAS, MD [facility] None  08/16/2024 11:00 AM [facility] INFUSION [facility] None  08/16/2024 11:00 AM  [facility] PHLEBOTOMIST [facility] None  08/16/2024 11:30 AM Cailey Trigueros, MD [facility] None  08/16/2024 12:15 PM [facility] INFUSION [facility] None     This document was completed utilizing speech recognition software. Grammatical errors, random word insertions, pronoun errors, and incomplete sentences are an occasional consequence of this system due to software limitations, ambient noise, and hardware issues. Any formal questions or concerns about the content, text or information contained within the body of this dictation should be directly addressed to the provider for clarification.

## 2024-05-17 ENCOUNTER — Other Ambulatory Visit

## 2024-05-17 ENCOUNTER — Ambulatory Visit: Admitting: Oncology

## 2024-05-24 DIAGNOSIS — G4733 Obstructive sleep apnea (adult) (pediatric): Secondary | ICD-10-CM | POA: Diagnosis not present

## 2024-05-27 DIAGNOSIS — G4733 Obstructive sleep apnea (adult) (pediatric): Secondary | ICD-10-CM | POA: Diagnosis not present

## 2024-05-28 ENCOUNTER — Other Ambulatory Visit: Payer: Self-pay | Admitting: Internal Medicine

## 2024-05-30 DIAGNOSIS — H2513 Age-related nuclear cataract, bilateral: Secondary | ICD-10-CM | POA: Diagnosis not present

## 2024-05-30 DIAGNOSIS — H1789 Other corneal scars and opacities: Secondary | ICD-10-CM | POA: Diagnosis not present

## 2024-05-30 DIAGNOSIS — H25013 Cortical age-related cataract, bilateral: Secondary | ICD-10-CM | POA: Diagnosis not present

## 2024-05-30 DIAGNOSIS — H5203 Hypermetropia, bilateral: Secondary | ICD-10-CM | POA: Diagnosis not present

## 2024-06-02 ENCOUNTER — Ambulatory Visit: Payer: Self-pay

## 2024-06-02 ENCOUNTER — Institutional Professional Consult (permissible substitution): Payer: Medicare HMO | Admitting: Psychology

## 2024-06-09 ENCOUNTER — Encounter: Payer: Medicare HMO | Admitting: Psychology

## 2024-06-13 ENCOUNTER — Ambulatory Visit: Payer: Medicare HMO | Admitting: Neurology

## 2024-06-24 DIAGNOSIS — G4733 Obstructive sleep apnea (adult) (pediatric): Secondary | ICD-10-CM | POA: Diagnosis not present

## 2024-06-27 ENCOUNTER — Inpatient Hospital Stay: Attending: Oncology

## 2024-06-27 DIAGNOSIS — D751 Secondary polycythemia: Secondary | ICD-10-CM | POA: Diagnosis not present

## 2024-06-27 DIAGNOSIS — G4733 Obstructive sleep apnea (adult) (pediatric): Secondary | ICD-10-CM | POA: Diagnosis not present

## 2024-06-27 LAB — HEMOGLOBIN AND HEMATOCRIT (CANCER CENTER ONLY)
HCT: 53.3 % — ABNORMAL HIGH (ref 39.0–52.0)
Hemoglobin: 17.9 g/dL — ABNORMAL HIGH (ref 13.0–17.0)

## 2024-07-11 ENCOUNTER — Encounter (HOSPITAL_COMMUNITY)

## 2024-07-13 ENCOUNTER — Telehealth (HOSPITAL_COMMUNITY): Payer: Self-pay

## 2024-07-13 NOTE — Progress Notes (Signed)
 PCP: Geofm Glade PARAS, MD HF Cardiology: Dr. Rolan  Chief complaint: f/u for HFpEF   78 y.o. with history of HTN, OSA, and peripheral edema was referred by Dr. Geofm for evaluation of CHF.  In early 2024, he began to note significant peripheral edema. He also noted dyspnea with moderate exertion.  He was started on Lasix  20 mg daily by his PCP.  He has continued to have peripheral edema with some improvement with addition of compression stockings.  He was started on Farxiga .   Echo in 7/24 showed EF 65-70%, normal RV size and systolic function, no MR, no aortic stenosis.   Cardiolite in 12/24 showed EF 72%, no ischemia/infarction.   Patient returns today for followup of diastolic CHF.  Here w/ his daughter and son-in-law.   Doing fairly well. He had tried reducing his lasix  to every other day but had recurrent leg edema and has since increased back to daily use. Edema resolved. He does not like the inconvenience of frequent urination, particularly when he is active going to the gym and running errands.   He remains active. Goes to the gym several days a week. Exercises on recumbent bike and able to complete 5.5 miles, typically 30 min, w/o exertional CP or dyspnea.   Labs followed by hematology, he is being followed there for Polycythemia. Recent CMP showed stable SCr at 1.3, c/w b/l and K 4.3.   BP is well controlled, 102/80 initial check. I personally rechecked BP and got 110/70. No orthostatic symptoms.   He reports some fatigue. He is on a ? blocker. Resting pulse rate in the 50s. Says HR increases into the 60s-70s w/ exercise.   He is compliant w/ OSA.    ECG (personally reviewed):  not performed   Labs (8/24): LDL 55, BNP 146 => 12.5, K 5.4, creatinine 1.63 Labs (9/24): K 4.3, creatinine 1.55 Labs (3/25): pro-BNP 32, K 4.7, creatinine 1.35, LDL 70 Labs (7/25): Scr  1.38, K 4.3   PMH: 1. GERD 2. HTN 3. OSA: uses CPAP 4. Chronic diastolic CHF: Echo (7/24) with EF 65-70%,  normal RV size and systolic function, no MR, no aortic stenosis.  - Cardiolite (12/24): EF 72%, no ischemia/infarction.  5. H/o lung nodules.  6. Dilated ascending aorta: 4.1 cm on 6/24 CT chest.  7. CKD stage 3  Social History   Socioeconomic History   Marital status: Divorced    Spouse name: Not on file   Number of children: 1   Years of education: 14   Highest education level: Not on file  Occupational History   Occupation: RETIRED  Tobacco Use   Smoking status: Former    Current packs/day: 0.00    Types: Cigarettes    Start date: 05/1963    Quit date: 05/2008    Years since quitting: 16.1   Smokeless tobacco: Never   Tobacco comments:    quit in 2009  Vaping Use   Vaping status: Never Used  Substance and Sexual Activity   Alcohol use: Not Currently    Alcohol/week: 1.0 standard drink of alcohol    Types: 1 Cans of beer per week    Comment: every month or 2 months.    Drug use: Not Currently    Comment: Marijuana long time ago it was occassional smoking,   Sexual activity: Not Currently  Other Topics Concern   Not on file  Social History Narrative   Diet:  No      Do you drink/ eat  things with caffeine? Yes      Marital status:      Single                         What year were you married ? 1967      Do you live in a house, apartment,assistred living, condo, trailer, etc.)? House      Is it one or more stories? No      How many persons live in your home ?  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  No                            Type & how often       ADVANCED DIRECTIVES (Please bring copies)      Do you have a living will? Yes      Do you have a DNR form?   Yes                    If not, do you want to discuss one?       Do you have signed POA?HPOA forms?   Yes              If so, please  bring to your appointment      FUNCTIONAL STATUS- To be completed by Spouse / child / Staff       Do you have difficulty bathing or dressing yourself ? No      Do you have difficulty preparing food or eating ? No      Do you have difficulty managing your mediation ? No      Do you have difficulty managing your finances ? No      Do you have difficulty affording your medication ? No            Right Handed    Lives in a one story home alone       Social Drivers of Health   Financial Resource Strain: Low Risk  (08/21/2022)   Overall Financial Resource Strain (CARDIA)    Difficulty of Paying Living Expenses: Not hard at all  Food Insecurity: No Food Insecurity (02/09/2024)   Hunger Vital Sign    Worried About Running Out of Food in the Last Year: Never true    Ran Out of Food in the Last Year: Never true  Transportation Needs: No Transportation Needs (02/09/2024)   PRAPARE - Administrator, Civil Service (Medical): No    Lack of Transportation (Non-Medical): No  Physical Activity: Insufficiently Active (12/11/2023)   Exercise Vital Sign    Days of Exercise per Week: 3 days    Minutes of Exercise per Session: 30 min  Stress: No Stress Concern Present (12/11/2023)   [name] of Occupational Health - Occupational Stress Questionnaire    Feeling of Stress : Not at all  Social Connections: Socially Isolated (12/11/2023)   Social Connection and Isolation Panel    Frequency of Communication with Friends and Family: Three times a week    Frequency of Social Gatherings with Friends and Family: Once a week    Attends  Religious Services: Never    Active Member of Clubs or Organizations: No    Attends Banker Meetings: Never    Marital Status: Divorced  Catering manager Violence: Not At Risk (02/09/2024)   Humiliation, Afraid, Rape, and Kick questionnaire    Fear of Current or Ex-Partner: No    Emotionally Abused: No    Physically Abused: No    Sexually  Abused: No   Family History  Problem Relation Age of Onset   Hepatitis C Mother 106   Stomach cancer Father 9   Von Willebrand disease Sister    Autoimmune disease Sister    Autoimmune disease Brother 73   Colon polyps Brother    Colon cancer Maternal Grandmother    Anuerysm Son 85   Esophageal cancer Neg Hx    ROS: All systems reviewed and negative except as per HPI.   Current Outpatient Medications  Medication Sig Dispense Refill   FARXIGA  10 MG TABS tablet TAKE 1 TABLET BY MOUTH ONCE DAILY BEFORE BREAKFAST 90 tablet 2   furosemide  (LASIX ) 20 MG tablet Take 1 tablet (20 mg total) by mouth every other day. (Patient taking differently: Take 20 mg by mouth every other day. Patient taking daily per his cardiologist) 30 tablet 2   metoprolol  tartrate (LOPRESSOR ) 25 MG tablet Take 1/2 (one-half) tablet by mouth twice daily 90 tablet 0   omeprazole  (PRILOSEC) 20 MG capsule Take 1 capsule (20 mg total) by mouth daily. 90 capsule 3   spironolactone  (ALDACTONE ) 25 MG tablet Take 0.5 tablets (12.5 mg total) by mouth daily. 45 tablet 3   No current facility-administered medications for this encounter.   BP 102/80   Pulse (!) 55   Ht 5' 6 (1.676 m)   Wt 104.9 kg (231 lb 3.2 oz)   SpO2 96%   BMI 37.32 kg/m  Physical Exam  ReDs 33%  GENERAL: obese, NAD Lungs- clear  CARDIAC:  JVP: not elevated          Normal rate with regular rhythm. No MRG. No LEE  ABDOMEN: obese, soft, non-tender, non-distended.  EXTREMITIES: Warm and well perfused.  NEUROLOGIC: No obvious FND   Assessment/Plan: 1. Chronic diastolic CHF: Echo in 7/24 showed EF 65-70%, normal RV size and systolic function, no MR, no aortic stenosis. Cardiolite in 12/24 with no ischemia/infarction.  NYHA class I-II. Euvolemic on exam and by ReDs, 33%.  - Continue Lasix  20 mg daily.  - Continue Farxiga  10 mg daily. Denies GU symptoms  - Continue spironolactone  12.5 mg daily  - reviewed recent CMP. Scr and K stable  2. CKD stage  3: baseline Scr 1.3 - on SGLT2i, Farxiga  - renal fx stable on recent labs  3. HTN: controlled on current regimen.  - no changes  4. OSA: on CPAP. Continue nightly use  5. Ascending aortic aneurysm: 4.1 cm (mildly dilated) on 6/24 CT.  6. Obesity: Body mass index is 37.32 kg/m. 7. Polycythemia: followed by oncology   F/u w/ Dr. Rolan in 6 months   Caffie Shed, PA-C  07/14/2024

## 2024-07-13 NOTE — Telephone Encounter (Signed)
 Called to confirm/remind patient of their appointment at [facility] on 07/14/24.   Appointment:   [] Confirmed  [x] Left mess   [] No answer/No voice mail  [] VM Full/unable to leave message  [] Phone not in service  And to bring in all medications and/or complete list.

## 2024-07-14 ENCOUNTER — Encounter (HOSPITAL_COMMUNITY): Payer: Self-pay

## 2024-07-14 ENCOUNTER — Ambulatory Visit (HOSPITAL_COMMUNITY)
Admission: RE | Admit: 2024-07-14 | Discharge: 2024-07-14 | Disposition: A | Discharge status: Home / Self Care | Source: Ambulatory Visit | Attending: Cardiology | Admitting: Cardiology

## 2024-07-14 VITALS — BP 102/80 | HR 55 | Ht 66.0 in | Wt 231.2 lb

## 2024-07-14 DIAGNOSIS — I7121 Aneurysm of the ascending aorta, without rupture: Secondary | ICD-10-CM | POA: Diagnosis not present

## 2024-07-14 DIAGNOSIS — Z6837 Body mass index (BMI) 37.0-37.9, adult: Secondary | ICD-10-CM | POA: Insufficient documentation

## 2024-07-14 DIAGNOSIS — Z87891 Personal history of nicotine dependence: Secondary | ICD-10-CM | POA: Diagnosis not present

## 2024-07-14 DIAGNOSIS — G4733 Obstructive sleep apnea (adult) (pediatric): Secondary | ICD-10-CM | POA: Diagnosis not present

## 2024-07-14 DIAGNOSIS — R600 Localized edema: Secondary | ICD-10-CM | POA: Insufficient documentation

## 2024-07-14 DIAGNOSIS — R5383 Other fatigue: Secondary | ICD-10-CM | POA: Insufficient documentation

## 2024-07-14 DIAGNOSIS — D751 Secondary polycythemia: Secondary | ICD-10-CM | POA: Insufficient documentation

## 2024-07-14 DIAGNOSIS — I5032 Chronic diastolic (congestive) heart failure: Secondary | ICD-10-CM | POA: Diagnosis not present

## 2024-07-14 DIAGNOSIS — Z7984 Long term (current) use of oral hypoglycemic drugs: Secondary | ICD-10-CM | POA: Insufficient documentation

## 2024-07-14 DIAGNOSIS — N183 Chronic kidney disease, stage 3 unspecified: Secondary | ICD-10-CM | POA: Insufficient documentation

## 2024-07-14 DIAGNOSIS — I130 Hypertensive heart and chronic kidney disease with heart failure and stage 1 through stage 4 chronic kidney disease, or unspecified chronic kidney disease: Secondary | ICD-10-CM | POA: Insufficient documentation

## 2024-07-14 DIAGNOSIS — E669 Obesity, unspecified: Secondary | ICD-10-CM | POA: Insufficient documentation

## 2024-07-14 DIAGNOSIS — Z79899 Other long term (current) drug therapy: Secondary | ICD-10-CM | POA: Diagnosis not present

## 2024-07-14 NOTE — Progress Notes (Signed)
 ReDS Vest / Clip - 07/14/24 1132       ReDS Vest / Clip   Station Marker D    Ruler Value 36.5    ReDS Value Range Low volume    ReDS Actual Value 34

## 2024-07-14 NOTE — Patient Instructions (Addendum)
 Thank you for coming in today  Follow up appointments:  Your physician recommends that you schedule a follow-up appointment in:  6 months With Dr. Mclean call in December 2025/January 2026 for March 2026 appointment.   Do the following things EVERYDAY: Weigh yourself in the morning before breakfast. Write it down and keep it in a log. Take your medicines as prescribed Eat low salt foods--Limit salt (sodium) to 2000 mg per day.  Stay as active as you can everyday Limit all fluids for the day to less than 2 liters   At the [facility], you and your health needs are our priority. As part of our continuing mission to provide you with exceptional heart care, we have created designated Provider Care Teams. These Care Teams include your primary Cardiologist (physician) and Advanced Practice Providers (APPs- Physician Assistants and Nurse Practitioners) who all work together to provide you with the care you need, when you need it.   You may see any of the following providers on your designated Care Team at your next follow up: Dr Toribio Fuel Dr Ezra Shuck Dr. Ria Gardenia Greig Lenetta, NP Caffie Shed, [place] [facility] [place], [place] Beckey Coe, NP Tinnie Redman, PharmD   Please be sure to bring in all your medications bottles to every appointment.    Thank you for choosing [facility]  If you have any questions or concerns before your next appointment please send us  a message through [facility] or call our office at [phone].    TO LEAVE A MESSAGE FOR THE NURSE SELECT OPTION 2, PLEASE LEAVE A MESSAGE INCLUDING: YOUR NAME DATE OF BIRTH CALL BACK NUMBER REASON FOR CALL**this is important as we prioritize the call backs  YOU WILL RECEIVE A CALL BACK THE SAME DAY AS LONG AS YOU CALL BEFORE 4:00 PM

## 2024-07-25 DIAGNOSIS — G4733 Obstructive sleep apnea (adult) (pediatric): Secondary | ICD-10-CM | POA: Diagnosis not present

## 2024-08-02 ENCOUNTER — Encounter: Payer: Self-pay | Admitting: Internal Medicine

## 2024-08-02 DIAGNOSIS — R7989 Other specified abnormal findings of blood chemistry: Secondary | ICD-10-CM | POA: Insufficient documentation

## 2024-08-02 NOTE — Patient Instructions (Addendum)
    Flu immunization administered today.      Blood work was ordered.       Medications changes include :   None    A Ct scan was ordered to evaluate your aortic aneurysm and someone will call you to schedule an appointment.     Return in about 6 months (around 01/31/2025) for Physical Exam.

## 2024-08-02 NOTE — Progress Notes (Unsigned)
      Subjective:    Patient ID: Justin Padilla, male    DOB: June 27, 1946, 78 y.o.   MRN: [id number]     HPI Linden is here for follow up of his chronic medical problems.  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  Goal is to maintain hematocrit below 55.  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    Medications and allergies reviewed with patient and updated if appropriate.  Current Outpatient Medications on File Prior to Visit  Medication Sig Dispense Refill   FARXIGA  10 MG TABS tablet TAKE 1 TABLET BY MOUTH ONCE DAILY BEFORE BREAKFAST 90 tablet 2   furosemide  (LASIX ) 20 MG tablet Take 1 tablet (20 mg total) by mouth every other day. (Patient taking differently: Take 20 mg by mouth every other day. Patient taking daily per his cardiologist) 30 tablet 2   metoprolol  tartrate (LOPRESSOR ) 25 MG tablet Take 1/2 (one-half) tablet by mouth twice daily 90 tablet 0   omeprazole  (PRILOSEC) 20 MG capsule Take 1 capsule (20 mg total) by mouth daily. 90 capsule 3   spironolactone  (ALDACTONE ) 25 MG tablet Take 0.5 tablets (12.5 mg total) by mouth daily. 45 tablet 3   No current facility-administered medications on file prior to visit.     Review of Systems     Objective:  There were no vitals filed for this visit. BP Readings from Last 3 Encounters:  07/14/24 102/80  05/16/24 108/67  02/09/24 128/64   Wt Readings from Last 3 Encounters:  07/14/24 231 lb 3.2 oz (104.9 kg)  05/16/24 226 lb 11.2 oz (102.8 kg)  02/09/24 240 lb 14.4 oz (109.3 kg)   There is no height or weight on file to calculate BMI.    Physical Exam     Lab Results  Component Value Date   WBC 8.0 05/16/2024   HGB 17.9 (H) 06/27/2024   HCT 53.3 (H) 06/27/2024   PLT 107 (L) 05/16/2024   GLUCOSE 81 05/16/2024   CHOL 124 02/01/2024   TRIG 57.0 02/01/2024   HDL 42.30 02/01/2024   LDLCALC 70 02/01/2024    ALT 8 05/16/2024   AST 19 05/16/2024   NA 136 05/16/2024   K 4.3 05/16/2024   CL 98 05/16/2024   CREATININE 1.38 (H) 05/16/2024   BUN 20 05/16/2024   CO2 25 05/16/2024   TSH 2.211 06/06/2023   PSA 3.26 11/07/2019   INR 1.2 06/03/2023   HGBA1C 5.7 02/01/2024     Assessment & Plan:    See Problem List for Assessment and Plan of chronic medical problems.

## 2024-08-03 ENCOUNTER — Ambulatory Visit (INDEPENDENT_AMBULATORY_CARE_PROVIDER_SITE_OTHER): Admitting: Internal Medicine

## 2024-08-03 VITALS — BP 110/72 | HR 54 | Temp 98.2°F | Ht 66.0 in | Wt 231.0 lb

## 2024-08-03 DIAGNOSIS — Z23 Encounter for immunization: Secondary | ICD-10-CM | POA: Diagnosis not present

## 2024-08-03 DIAGNOSIS — I5032 Chronic diastolic (congestive) heart failure: Secondary | ICD-10-CM | POA: Diagnosis not present

## 2024-08-03 DIAGNOSIS — Z125 Encounter for screening for malignant neoplasm of prostate: Secondary | ICD-10-CM

## 2024-08-03 DIAGNOSIS — J439 Emphysema, unspecified: Secondary | ICD-10-CM

## 2024-08-03 DIAGNOSIS — K219 Gastro-esophageal reflux disease without esophagitis: Secondary | ICD-10-CM | POA: Diagnosis not present

## 2024-08-03 DIAGNOSIS — N1831 Chronic kidney disease, stage 3a: Secondary | ICD-10-CM

## 2024-08-03 DIAGNOSIS — I10 Essential (primary) hypertension: Secondary | ICD-10-CM | POA: Diagnosis not present

## 2024-08-03 DIAGNOSIS — R7989 Other specified abnormal findings of blood chemistry: Secondary | ICD-10-CM

## 2024-08-03 DIAGNOSIS — R7303 Prediabetes: Secondary | ICD-10-CM

## 2024-08-03 DIAGNOSIS — R600 Localized edema: Secondary | ICD-10-CM

## 2024-08-03 DIAGNOSIS — I7121 Aneurysm of the ascending aorta, without rupture: Secondary | ICD-10-CM

## 2024-08-03 DIAGNOSIS — D751 Secondary polycythemia: Secondary | ICD-10-CM

## 2024-08-03 DIAGNOSIS — G4733 Obstructive sleep apnea (adult) (pediatric): Secondary | ICD-10-CM

## 2024-08-03 DIAGNOSIS — D696 Thrombocytopenia, unspecified: Secondary | ICD-10-CM

## 2024-08-03 DIAGNOSIS — I714 Abdominal aortic aneurysm, without rupture, unspecified: Secondary | ICD-10-CM

## 2024-08-03 LAB — VITAMIN B12: Vitamin B-12: 233 pg/mL (ref 211–911)

## 2024-08-03 LAB — COMPREHENSIVE METABOLIC PANEL WITH GFR
ALT: 10 U/L (ref 0–53)
AST: 15 U/L (ref 0–37)
Albumin: 4.6 g/dL (ref 3.5–5.2)
Alkaline Phosphatase: 57 U/L (ref 39–117)
BUN: 18 mg/dL (ref 6–23)
CO2: 22 meq/L (ref 19–32)
Calcium: 9.7 mg/dL (ref 8.4–10.5)
Chloride: 103 meq/L (ref 96–112)
Creatinine, Ser: 1.22 mg/dL (ref 0.40–1.50)
GFR: 56.43 mL/min — ABNORMAL LOW (ref >60.00)
Glucose, Bld: 83 mg/dL (ref 70–99)
Potassium: 4.8 meq/L (ref 3.5–5.1)
Sodium: 141 meq/L (ref 135–145)
Total Bilirubin: 0.8 mg/dL (ref 0.2–1.2)
Total Protein: 6.8 g/dL (ref 6.0–8.3)

## 2024-08-03 LAB — TSH: TSH: 2.92 u[IU]/mL (ref 0.35–5.50)

## 2024-08-03 LAB — LIPID PANEL
Cholesterol: 134 mg/dL (ref 0–200)
HDL: 44.4 mg/dL (ref >39.00)
LDL Cholesterol: 75 mg/dL (ref 0–99)
NonHDL: 89.73
Total CHOL/HDL Ratio: 3
Triglycerides: 76 mg/dL (ref 0.0–149.0)
VLDL: 15.2 mg/dL (ref 0.0–40.0)

## 2024-08-03 LAB — PSA, MEDICARE: PSA: 4.2 ng/mL — ABNORMAL HIGH (ref 0.10–4.00)

## 2024-08-03 LAB — BRAIN NATRIURETIC PEPTIDE: Pro B Natriuretic peptide (BNP): 41 pg/mL (ref 0.0–100.0)

## 2024-08-03 LAB — HEMOGLOBIN A1C: Hgb A1c MFr Bld: 6 % (ref 4.6–6.5)

## 2024-08-03 NOTE — Assessment & Plan Note (Signed)
 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

## 2024-08-03 NOTE — Assessment & Plan Note (Signed)
Chronic GERD controlled Continue omeprazole 20 mg daily  

## 2024-08-03 NOTE — Assessment & Plan Note (Addendum)
 Chronic Kidney function stable Blood pressure well-controlled, sugars well-controlled, heart failure well-controlled Continue weight loss efforts Low-sodium, low sugar diet He is typically staying well-hydrated CMP

## 2024-08-03 NOTE — Assessment & Plan Note (Addendum)
 Chronic Appears euvolemic Following with cardiology Continue Farxiga  10 mg daily, furosemide  20 mg daily, metoprolol  12.5 mg twice daily, spironolactone  12.5 mg daily CMP, BNP

## 2024-08-03 NOTE — Assessment & Plan Note (Signed)
 Chronic History of abdominal aortic aneurysm-diagnosed in [place]  Has not had any imaging down here to monitor the Ultrasound ordered

## 2024-08-03 NOTE — Assessment & Plan Note (Signed)
 Chronic Following with pulmonary Asymptomatic Not on any inhalers since asymptomatic

## 2024-08-03 NOTE — Assessment & Plan Note (Addendum)
 Chronic Controlled-trace if any edema today Currently on Lasix  20 mg every other day, Farxiga  10 mg daily, spironolactone  12.5 mg daily Wearing compression socks daily Compliant with low-sodium diet He is exercising regularly Working on weight loss

## 2024-08-03 NOTE — Assessment & Plan Note (Addendum)
 Chronic Taking a multivitamin daily Check vitamin B12 level

## 2024-08-03 NOTE — Assessment & Plan Note (Signed)
 Chronic ?Using CPAP nightly ?

## 2024-08-03 NOTE — Assessment & Plan Note (Signed)
 Chronic Last CT scan 04/2023-due for CT scan CTA ordered

## 2024-08-03 NOTE — Assessment & Plan Note (Signed)
 Chronic Blood pressure well controlled CMP Continue  metoprolol  12.5 mg twice daily, spironolactone  12.5 mg daily

## 2024-08-03 NOTE — Assessment & Plan Note (Addendum)
 Chronic BMI 37.28  with hypertension, HFpEF, GERD Encouraged continuing weight loss efforts Continue regular exercise Healthy diet-lots of lean protein, vegetables, fruit-he is eating healthy, but needs to concentrate on eating more regularly and eating more protein  Wt Readings from Last 3 Encounters:  08/03/24 231 lb (104.8 kg)  07/14/24 231 lb 3.2 oz (104.9 kg)  05/16/24 226 lb 11.2 oz (102.8 kg)

## 2024-08-04 ENCOUNTER — Encounter: Payer: Self-pay | Admitting: Internal Medicine

## 2024-08-04 DIAGNOSIS — R350 Frequency of micturition: Secondary | ICD-10-CM

## 2024-08-04 DIAGNOSIS — R972 Elevated prostate specific antigen [PSA]: Secondary | ICD-10-CM

## 2024-08-06 ENCOUNTER — Ambulatory Visit: Payer: Self-pay | Admitting: Internal Medicine

## 2024-08-11 ENCOUNTER — Telehealth: Payer: Self-pay | Admitting: Oncology

## 2024-08-11 NOTE — Telephone Encounter (Signed)
 PT CALLED TO RESCHEDULE APPTS

## 2024-08-16 ENCOUNTER — Other Ambulatory Visit

## 2024-08-16 ENCOUNTER — Encounter

## 2024-08-16 ENCOUNTER — Ambulatory Visit: Admitting: Oncology

## 2024-08-23 ENCOUNTER — Inpatient Hospital Stay (HOSPITAL_BASED_OUTPATIENT_CLINIC_OR_DEPARTMENT_OTHER): Admitting: Oncology

## 2024-08-23 ENCOUNTER — Inpatient Hospital Stay

## 2024-08-23 ENCOUNTER — Inpatient Hospital Stay: Attending: Oncology

## 2024-08-23 VITALS — BP 115/56 | HR 52 | Temp 97.7°F | Resp 18 | Ht 66.0 in | Wt 240.2 lb

## 2024-08-23 DIAGNOSIS — D696 Thrombocytopenia, unspecified: Secondary | ICD-10-CM

## 2024-08-23 DIAGNOSIS — D751 Secondary polycythemia: Secondary | ICD-10-CM

## 2024-08-23 LAB — CBC WITH DIFFERENTIAL (CANCER CENTER ONLY)
Abs Immature Granulocytes: 0.01 K/uL (ref 0.00–0.07)
Basophils Absolute: 0 K/uL (ref 0.0–0.1)
Basophils Relative: 1 %
Eosinophils Absolute: 0.1 K/uL (ref 0.0–0.5)
Eosinophils Relative: 1 %
HCT: 51.6 % (ref 39.0–52.0)
Hemoglobin: 17.5 g/dL — ABNORMAL HIGH (ref 13.0–17.0)
Immature Granulocytes: 0 %
Lymphocytes Relative: 23 %
Lymphs Abs: 1.7 K/uL (ref 0.7–4.0)
MCH: 31.3 pg (ref 26.0–34.0)
MCHC: 33.9 g/dL (ref 30.0–36.0)
MCV: 92.3 fL (ref 80.0–100.0)
Monocytes Absolute: 0.5 K/uL (ref 0.1–1.0)
Monocytes Relative: 7 %
Neutro Abs: 5 K/uL (ref 1.7–7.7)
Neutrophils Relative %: 68 %
Platelet Count: 113 K/uL — ABNORMAL LOW (ref 150–400)
RBC: 5.59 MIL/uL (ref 4.22–5.81)
RDW: 13.9 % (ref 11.5–15.5)
WBC Count: 7.4 K/uL (ref 4.0–10.5)
nRBC: 0 % (ref 0.0–0.2)

## 2024-08-23 LAB — CMP (CANCER CENTER ONLY)
ALT: 9 U/L (ref 0–44)
AST: 18 U/L (ref 15–41)
Albumin: 4 g/dL (ref 3.5–5.0)
Alkaline Phosphatase: 66 U/L (ref 38–126)
Anion gap: 7 (ref 5–15)
BUN: 20 mg/dL (ref 8–23)
CO2: 27 mmol/L (ref 22–32)
Calcium: 9.2 mg/dL (ref 8.9–10.3)
Chloride: 105 mmol/L (ref 98–111)
Creatinine: 1.28 mg/dL — ABNORMAL HIGH (ref 0.61–1.24)
GFR, Estimated: 57 mL/min — ABNORMAL LOW (ref >60)
Glucose, Bld: 100 mg/dL — ABNORMAL HIGH (ref 70–99)
Potassium: 4.9 mmol/L (ref 3.5–5.1)
Sodium: 139 mmol/L (ref 135–145)
Total Bilirubin: 0.6 mg/dL (ref 0.0–1.2)
Total Protein: 6.3 g/dL — ABNORMAL LOW (ref 6.5–8.1)

## 2024-08-23 NOTE — Progress Notes (Signed)
 [facility]  HEMATOLOGY CLINIC PROGRESS NOTE  PATIENT NAME: Justin Padilla   MR#: [id number] DOB: 10/11/1946  Patient Care Team: Geofm Glade PARAS, MD as PCP - General (Internal Medicine) Santo Stanly LABOR, MD as PCP - Cardiology (Cardiology) Tobie Tonita POUR, DO as Consulting Physician (Neurology)  Date of visit: 08/23/2024   ASSESSMENT & PLAN:   Justin Padilla is a 78 y.o. gentleman with past medical history of GERD, fatty liver, hypertension, obstructive sleep apnea on CPAP, silicosis, was referred to our clinic in April 2025 for evaluation of polycythemia.  JAK2, CALR, MPL, exon 12-15 mutations negative.  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    Secondary polycythemia He has had polycythemia since in his 30s and used to have phlebotomies to manage polycythemia, last 1 in 2016.    More recently, labs on 02/01/2024 showed hemoglobin of 18.9, hematocrit of 55.9 and hence a referral was sent to us .    On his initial consultation with us  on 02/09/2024, labs showed improved hemoglobin of 17.4, hematocrit better at 52.6.  White count 7400 with normal differential.  Platelet count stable at 105,000.  CMP, LDH, iron studies, B12, folate were all within normal limits.  Erythropoietin  level was within normal limits.  JAK2 testing followed by reflex testing to include CALR, MPL, exon 12-15 mutations were all negative, thus ruling out primary polycythemia.  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   Goal is to maintain hematocrit below 55 to avoid hyperviscosity related complications.    Hematocrit is at 51.6, which is nearly normal. Hydration is positively contributing to the red blood cell count. No phlebotomy is required at these levels.  - Schedule follow-up blood work in three months to monitor hematocrit and  hemoglobin levels.  - Encourage continued hydration to maintain stable red blood cell counts.  Thrombocytopenia Platelet count fluctuates between 105,000 and 120,000, below normal range.  Fatty liver and medications may be contributing to thrombocytopenia. He denies bleeding issues.  Platelet count stable at 113,000 today.  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  Will continue to monitor for now.   I spent a total of 22 minutes during this encounter with the patient including review of chart and various tests results, discussions about plan of care and coordination of care plan.  I reviewed lab results and outside records for this visit and discussed relevant results with the patient. Diagnosis, plan of care and treatment options were also discussed in detail with the patient. Opportunity provided to ask questions and answers provided to his apparent satisfaction. Provided instructions to call our clinic with any problems, questions or concerns prior to return visit. I recommended to continue follow-up with PCP and sub-specialists. He verbalized understanding and agreed with the plan. No barriers to learning was detected.  Justin Patten, MD  08/23/2024 11:58 AM  [facility] - A DEPT OF 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  INTERVAL HISTORY:  Discussed the use of AI scribe software for clinical note transcription with the patient, who gave verbal consent to proceed.  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  He has a long-standing history of polycythemia vera since age 82. Recent lab results show improvement with a hemoglobin level of 17.5 g/dL, down from  previous levels of 19 and 18 g/dL, and a hematocrit of 48.3%. He has not required phlebotomy recently. He reports that he has been staying hydrated.  His platelet count is currently 113,000, which is an improvement from previous counts of 105,000 and 107,000. He has a history of thrombocytopenia.  He has been experiencing bradycardia, with a pulse of 52 bpm, and noted a low blood pressure reading of 115/56 mmHg. He took metoprolol  around 1 AM, which may contribute to the low pulse rate. He was surprised by the low blood pressure but has not expressed any immediate concerns.  He reports eating well and has gained some weight, mentioning that his niece Karna has been preparing meals for him. He used to donate blood regularly but has stopped due to concerns about increasing his blood production.   SUMMARY OF HEMATOLOGIC HISTORY:  On 02/01/2024, routine labs at his PCPs office showed hemoglobin of 18.9, hematocrit of 55.9.  White count was 8000 with normal differential.  Platelet count was decreased at 111,000.  He was referred to us  for further evaluation of polycythemia.   On review of available records, he has had fluctuating polycythemia since August 2021 with hemoglobin ranging anywhere between 15.7-18.9.  Also has had chronic mild thrombocytopenia with platelet count in the range of 85,000-150,000.   He has had polycythemia since in his 30s and used to have regular phlebotomies to manage his polycythemia, with the last one being in 2016. 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 The patient also reports having had a problem with passing blood in his urine when he was young, but this resolved after he was advised to drink half a can of beer daily. The patient quit smoking in 2009 and uses a CPAP machine for his sleep apnea. 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  He also has a fatty liver.   He denies intermittent headaches, shortness of breath on exertion, dizziness, frequent leg cramps or chest pain. He denies any history of blood clot.    He has had polycythemia since in his 30s and used to have phlebotomies to manage polycythemia, last one in 2016.     More recently, labs on 02/01/2024 showed hemoglobin of 18.9, hematocrit of 55.9 and hence a referral was sent to us .     On his initial consultation with us  on 02/09/2024, labs showed improved hemoglobin of 17.4, hematocrit better at 52.6.  White count 7400 with normal differential.  Platelet count stable at 105,000.  CMP, LDH, iron studies, B12, folate were all within normal limits.  Erythropoietin  level was within normal limits.  JAK2 testing followed by reflex testing to include CALR, MPL, exon 12-15 mutations were all negative, thus ruling out primary polycythemia.   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   Goal is to maintain hematocrit below 55 to avoid hyperviscosity related complications.  I have reviewed the past medical history, past surgical history, social history and family history with the patient and they are unchanged from previous note.  ALLERGIES: He is allergic to trazodone  and nefazodone.  MEDICATIONS:  Current Outpatient Medications  Medication Sig Dispense Refill   FARXIGA  10 MG TABS tablet  TAKE 1 TABLET BY MOUTH ONCE DAILY BEFORE BREAKFAST 90 tablet 2   furosemide  (LASIX ) 20 MG tablet Take 1 tablet (20 mg total) by mouth every other day. (Patient taking differently: Take 20 mg by mouth every other day. Patient taking daily per his cardiologist) 30 tablet 2   metoprolol  tartrate (LOPRESSOR ) 25 MG tablet Take 1/2 (one-half) tablet by mouth twice daily 90 tablet 0   Multiple Vitamin (MULTIVITAMIN WITH MINERALS) TABS tablet Take 1 tablet by mouth daily.     omeprazole  (PRILOSEC) 20 MG capsule Take 1  capsule (20 mg total) by mouth daily. 90 capsule 3   spironolactone  (ALDACTONE ) 25 MG tablet Take 0.5 tablets (12.5 mg total) by mouth daily. 45 tablet 3   No current facility-administered medications for this visit.     REVIEW OF SYSTEMS:    Review of Systems - Oncology  All other pertinent systems were reviewed with the patient and are negative.  PHYSICAL EXAMINATION:   Onc Performance Status - 08/23/24 1139       ECOG Perf Status   ECOG Perf Status Fully active, able to carry on all pre-disease performance without restriction      KPS SCALE   KPS % SCORE Normal, no compliants, no evidence of disease           Vitals:   08/23/24 1133  BP: (!) 115/56  Pulse: (!) 52  Resp: 18  Temp: 97.7 F (36.5 C)  SpO2: 96%   Filed Weights   08/23/24 1133  Weight: 240 lb 3.2 oz (109 kg)    Physical Exam Constitutional:      General: He is not in acute distress.    Appearance: Normal appearance.  HENT:     Head: Normocephalic and atraumatic.  Eyes:     Conjunctiva/sclera: Conjunctivae normal.  Cardiovascular:     Rate and Rhythm: Normal rate and regular rhythm.     Heart sounds: Normal heart sounds.  Pulmonary:     Effort: Pulmonary effort is normal. No respiratory distress.     Breath sounds: Normal breath sounds.  Abdominal:     General: There is no distension.  Neurological:     General: No focal deficit present.     Mental Status: He is alert and oriented to person, place, and time.  Psychiatric:        Mood and Affect: Mood normal.        Behavior: Behavior normal.     LABORATORY DATA:   I have reviewed the data as listed.  Results for orders placed or performed in visit on 08/23/24  CMP (Cancer Center only)  Result Value Ref Range   Sodium 139 135 - 145 mmol/L   Potassium 4.9 3.5 - 5.1 mmol/L   Chloride 105 98 - 111 mmol/L   CO2 27 22 - 32 mmol/L   Glucose, Bld 100 (H) 70 - 99 mg/dL   BUN 20 8 - 23 mg/dL   Creatinine 8.71 (H) 9.38 - 1.24 mg/dL    Calcium 9.2 8.9 - 89.6 mg/dL   Total Protein 6.3 (L) 6.5 - 8.1 g/dL   Albumin 4.0 3.5 - 5.0 g/dL   AST 18 15 - 41 U/L   ALT 9 0 - 44 U/L   Alkaline Phosphatase 66 38 - 126 U/L   Total Bilirubin 0.6 0.0 - 1.2 mg/dL   GFR, Estimated 57 (L) >60 mL/min   Anion gap 7 5 - 15  CBC with Differential (Cancer Center Only)  Result  Value Ref Range   WBC Count 7.4 4.0 - 10.5 K/uL   RBC 5.59 4.22 - 5.81 MIL/uL   Hemoglobin 17.5 (H) 13.0 - 17.0 g/dL   HCT 48.3 60.9 - 47.9 %   MCV 92.3 80.0 - 100.0 fL   MCH 31.3 26.0 - 34.0 pg   MCHC 33.9 30.0 - 36.0 g/dL   RDW 86.0 88.4 - 84.4 %   Platelet Count 113 (L) 150 - 400 K/uL   nRBC 0.0 0.0 - 0.2 %   Neutrophils Relative % 68 %   Neutro Abs 5.0 1.7 - 7.7 K/uL   Lymphocytes Relative 23 %   Lymphs Abs 1.7 0.7 - 4.0 K/uL   Monocytes Relative 7 %   Monocytes Absolute 0.5 0.1 - 1.0 K/uL   Eosinophils Relative 1 %   Eosinophils Absolute 0.1 0.0 - 0.5 K/uL   Basophils Relative 1 %   Basophils Absolute 0.0 0.0 - 0.1 K/uL   Immature Granulocytes 0 %   Abs Immature Granulocytes 0.01 0.00 - 0.07 K/uL      RADIOGRAPHIC STUDIES:  No recent pertinent imaging studies available to review.  Orders Placed This Encounter  Procedures   CBC with Differential (Cancer Center Only)    Standing Status:   Future    Expected Date:   11/23/2024    Expiration Date:   02/21/2025   CMP (Cancer Center only)    Standing Status:   Future    Expected Date:   11/23/2024    Expiration Date:   02/21/2025     Future Appointments  Date Time Provider Department Center  11/21/2024  9:15 AM [facility] PHLEBOTOMIST [facility] None  11/21/2024  9:45 AM Elyssia Strausser, Chinita, MD [facility] None  02/01/2025  1:20 PM Geofm Glade PARAS, MD [facility]    This document was completed utilizing engineer, civil (consulting). Grammatical errors, random word insertions, pronoun errors, and incomplete sentences are an occasional consequence of this system due to software limitations, ambient  noise, and hardware issues. Any formal questions or concerns about the content, text or information contained within the body of this dictation should be directly addressed to the provider for clarification.

## 2024-08-23 NOTE — Assessment & Plan Note (Addendum)
 Platelet count fluctuates between 105,000 and 120,000, below normal range.  Fatty liver and medications may be contributing to thrombocytopenia. He denies bleeding issues.  Platelet count stable at 113,000 today.  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  Will continue to monitor for now.

## 2024-08-23 NOTE — Assessment & Plan Note (Addendum)
 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    More recently, labs on 02/01/2024 showed hemoglobin of 18.9, hematocrit of 55.9 and hence a referral was sent to us .    On his initial consultation with us  on 02/09/2024, labs showed improved hemoglobin of 17.4, hematocrit better at 52.6.  White count 7400 with normal differential.  Platelet count stable at 105,000.  CMP, LDH, iron studies, B12, folate were all within normal limits.  Erythropoietin  level was within normal limits.  JAK2 testing followed by reflex testing to include CALR, MPL, exon 12-15 mutations were all negative, thus ruling out primary polycythemia.  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   Goal is to maintain hematocrit below 55 to avoid hyperviscosity related complications.    Hematocrit is at 51.6, which is nearly normal. Hydration is positively contributing to the red blood cell count. No phlebotomy is required at these levels.  - Schedule follow-up blood work in three months to monitor hematocrit and hemoglobin levels.  - Encourage continued hydration to maintain stable red blood cell counts.

## 2024-09-05 ENCOUNTER — Other Ambulatory Visit (HOSPITAL_COMMUNITY): Payer: Self-pay | Admitting: Cardiology

## 2024-09-05 ENCOUNTER — Other Ambulatory Visit: Payer: Self-pay | Admitting: Internal Medicine

## 2024-09-05 MED ORDER — FUROSEMIDE 20 MG PO TABS: 20.0000 mg | ORAL_TABLET | Freq: Every day | ORAL | 3 refills | Status: AC

## 2024-09-05 NOTE — Addendum Note (Signed)
 Addended by: Kamalani Mastro, DALTON HERO on: 09/05/2024 03:08 PM   Modules accepted: Orders

## 2024-09-07 ENCOUNTER — Encounter (HOSPITAL_BASED_OUTPATIENT_CLINIC_OR_DEPARTMENT_OTHER): Payer: Self-pay

## 2024-09-07 ENCOUNTER — Ambulatory Visit (HOSPITAL_BASED_OUTPATIENT_CLINIC_OR_DEPARTMENT_OTHER)
Admission: RE | Admit: 2024-09-07 | Discharge: 2024-09-07 | Disposition: A | Discharge status: Home / Self Care | Source: Ambulatory Visit | Attending: Internal Medicine | Admitting: Internal Medicine

## 2024-09-07 DIAGNOSIS — I7121 Aneurysm of the ascending aorta, without rupture: Secondary | ICD-10-CM

## 2024-09-07 DIAGNOSIS — I77811 Abdominal aortic ectasia: Secondary | ICD-10-CM | POA: Diagnosis not present

## 2024-09-07 DIAGNOSIS — I714 Abdominal aortic aneurysm, without rupture, unspecified: Secondary | ICD-10-CM | POA: Diagnosis not present

## 2024-09-07 MED ORDER — IOHEXOL 350 MG/ML SOLN
100.0000 mL | Freq: Once | INTRAVENOUS | Status: AC | PRN
  Administered 2024-09-07: 100 mL via INTRAVENOUS

## 2024-09-21 ENCOUNTER — Encounter: Payer: Self-pay | Admitting: Oncology

## 2024-09-22 ENCOUNTER — Other Ambulatory Visit: Payer: Self-pay | Admitting: Internal Medicine

## 2024-09-23 NOTE — Telephone Encounter (Signed)
 Patient called. Please call the patient back when the refill has been sent to the pharmacy.

## 2024-10-04 DIAGNOSIS — R972 Elevated prostate specific antigen [PSA]: Secondary | ICD-10-CM | POA: Diagnosis not present

## 2024-10-04 DIAGNOSIS — R3915 Urgency of urination: Secondary | ICD-10-CM | POA: Diagnosis not present

## 2024-10-04 DIAGNOSIS — R350 Frequency of micturition: Secondary | ICD-10-CM | POA: Diagnosis not present

## 2024-11-21 ENCOUNTER — Inpatient Hospital Stay: Attending: Oncology

## 2024-11-21 ENCOUNTER — Inpatient Hospital Stay: Admitting: Oncology

## 2024-11-21 ENCOUNTER — Telehealth: Payer: Self-pay

## 2024-11-21 ENCOUNTER — Encounter: Payer: Self-pay | Admitting: Oncology

## 2024-11-21 VITALS — BP 115/77 | HR 55 | Temp 97.8°F | Resp 16 | Wt 247.2 lb

## 2024-11-21 DIAGNOSIS — D751 Secondary polycythemia: Secondary | ICD-10-CM

## 2024-11-21 DIAGNOSIS — D696 Thrombocytopenia, unspecified: Secondary | ICD-10-CM

## 2024-11-21 LAB — CBC WITH DIFFERENTIAL (CANCER CENTER ONLY)
Abs Immature Granulocytes: 0.02 K/uL (ref 0.00–0.07)
Basophils Absolute: 0 K/uL (ref 0.0–0.1)
Basophils Relative: 0 %
Eosinophils Absolute: 0.1 K/uL (ref 0.0–0.5)
Eosinophils Relative: 1 %
HCT: 56.6 % — ABNORMAL HIGH (ref 39.0–52.0)
Hemoglobin: 18.9 g/dL — ABNORMAL HIGH (ref 13.0–17.0)
Immature Granulocytes: 0 %
Lymphocytes Relative: 21 %
Lymphs Abs: 1.8 K/uL (ref 0.7–4.0)
MCH: 30.5 pg (ref 26.0–34.0)
MCHC: 33.4 g/dL (ref 30.0–36.0)
MCV: 91.4 fL (ref 80.0–100.0)
Monocytes Absolute: 0.7 K/uL (ref 0.1–1.0)
Monocytes Relative: 8 %
Neutro Abs: 6.2 K/uL (ref 1.7–7.7)
Neutrophils Relative %: 70 %
Platelet Count: 120 K/uL — ABNORMAL LOW (ref 150–400)
RBC: 6.19 MIL/uL — ABNORMAL HIGH (ref 4.22–5.81)
RDW: 13.7 % (ref 11.5–15.5)
WBC Count: 8.8 K/uL (ref 4.0–10.5)
nRBC: 0 % (ref 0.0–0.2)

## 2024-11-21 LAB — CMP (CANCER CENTER ONLY)
ALT: 14 U/L (ref 0–44)
AST: 19 U/L (ref 15–41)
Albumin: 4.6 g/dL (ref 3.5–5.0)
Alkaline Phosphatase: 75 U/L (ref 38–126)
Anion gap: 14 (ref 5–15)
BUN: 25 mg/dL — ABNORMAL HIGH (ref 8–23)
CO2: 26 mmol/L (ref 22–32)
Calcium: 10.2 mg/dL (ref 8.9–10.3)
Chloride: 101 mmol/L (ref 98–111)
Creatinine: 1.39 mg/dL — ABNORMAL HIGH (ref 0.61–1.24)
GFR, Estimated: 52 mL/min — ABNORMAL LOW
Glucose, Bld: 115 mg/dL — ABNORMAL HIGH (ref 70–99)
Potassium: 5 mmol/L (ref 3.5–5.1)
Sodium: 140 mmol/L (ref 135–145)
Total Bilirubin: 0.7 mg/dL (ref 0.0–1.2)
Total Protein: 7.2 g/dL (ref 6.5–8.1)

## 2024-11-21 NOTE — Progress Notes (Signed)
 "  [facility]  HEMATOLOGY CLINIC PROGRESS NOTE  PATIENT NAME: Justin Padilla   MR#: [id number] DOB: 23-Oct-1946  Patient Care Team: Geofm Glade PARAS, MD as PCP - General (Internal Medicine) Santo Stanly LABOR, MD as PCP - Cardiology (Cardiology) Tobie Tonita POUR, DO as Consulting Physician (Neurology)  Date of visit: 11/21/2024   ASSESSMENT & PLAN:   Justin Padilla is a 79 y.o. gentleman with past medical history of GERD, fatty liver, hypertension, obstructive sleep apnea on CPAP, silicosis, was referred to our clinic in April 2025 for evaluation of polycythemia.  JAK2, CALR, MPL, exon 12-15 mutations negative.  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    Secondary polycythemia He has had polycythemia since in his 30s and used to have phlebotomies to manage polycythemia, last 1 in 2016.    More recently, labs on 02/01/2024 showed hemoglobin of 18.9, hematocrit of 55.9 and hence a referral was sent to us .    On his initial consultation with us  on 02/09/2024, labs showed improved hemoglobin of 17.4, hematocrit better at 52.6.  White count 7400 with normal differential.  Platelet count stable at 105,000.  CMP, LDH, iron studies, B12, folate were all within normal limits.  Erythropoietin  level was within normal limits.  JAK2 testing followed by reflex testing to include CALR, MPL, exon 12-15 mutations were all negative, thus ruling out primary polycythemia.  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   Goal is to maintain hematocrit below 55 to avoid hyperviscosity related complications.    Hematocrit is at 56.6, increased compared to prior.  Proposed proceeding with 1 unit of therapeutic phlebotomy.  Patient is asymptomatic.  He is concerned about his low blood pressure and low heart rate and would like to defer therapeutic  phlebotomy at this time.  He is willing to reconsider this on return visit in approximately 2 months.  We respect his wishes.  No concerning symptoms at this time.  - Schedule follow-up blood work in 2 months to monitor hematocrit and hemoglobin levels.  - Encourage continued hydration to maintain stable red blood cell counts.  Thrombocytopenia Platelet count fluctuates between 105,000 and 120,000, below normal range.  Fatty liver and medications may be contributing to thrombocytopenia. He denies bleeding issues.  Platelet count stable at 120,000 today.  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  No intervention needed at current values.  Will continue to monitor for now.   I spent a total of 23 minutes during this encounter with the patient including review of chart and various tests results, discussions about plan of care and coordination of care plan.  I reviewed lab results and outside records for this visit and discussed relevant results with the patient. Diagnosis, plan of care and treatment options were also discussed in detail with the patient. Opportunity provided to ask questions and answers provided to his apparent satisfaction. Provided instructions to call our clinic with any problems, questions or concerns prior to return visit. I recommended to continue follow-up with PCP and sub-specialists. He verbalized understanding and agreed with the plan. No barriers to learning was detected.  Chinita Patten, MD  11/21/2024 12:27 PM  [facility] - A DEPT OF 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  Goal is to maintain hematocrit below 55 with therapeutic phlebotomies as needed  INTERVAL HISTORY:  Discussed the use of AI scribe software for clinical  note transcription with the patient, who gave verbal consent to proceed.  History of Present Illness  Justin Padilla is a 79 year old male with secondary polycythemia and thrombocytopenia who presents for hematology follow-up due to elevated hematocrit.  He has longstanding secondary polycythemia, with recent laboratory evaluation demonstrating an increase in hematocrit to 56.6%, exceeding the therapeutic threshold of 55%. Previous hematocrit values have remained at or below 55% since April of last year, with a prior value of 51.6% at his last visit. Platelet count is currently 120,000, improved from 113,000 previously, and white blood cell count remains within normal limits. He has not required phlebotomy at this clinic, with only a single prior hematocrit value above threshold in March of last year (55.9%).  He maintains adequate hydration, reports regular water intake, and has stable weight. He notes increased urinary frequency after taking morning medications, urinating three times since arriving at the clinic. He has bradycardia and hypotension, with blood pressure measured at 115/77 mmHg today, and expresses concern regarding persistently low heart rate. He denies chest pain, dyspnea, or palpitations. He describes a mild, intermittent cough occurring occasionally, without persistent or progressive respiratory symptoms. He denies new or enlarging lymphadenopathy.   SUMMARY OF HEMATOLOGIC HISTORY:  On 02/01/2024, routine labs at his PCPs office showed hemoglobin of 18.9, hematocrit of 55.9.  White count was 8000 with normal differential.  Platelet count was decreased at 111,000.  He was referred to us  for further evaluation of polycythemia.   On review of available records, he has had fluctuating polycythemia since August 2021 with hemoglobin ranging anywhere between 15.7-18.9.  Also has had chronic mild thrombocytopenia with platelet count in the range of 85,000-150,000.   He has had  polycythemia since in his 30s and used to have regular phlebotomies to manage his polycythemia, with the last one being in 2016. 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 The patient also reports having had a problem with passing blood in his urine when he was young, but this resolved after he was advised to drink half a can of beer daily. The patient quit smoking in 2009 and uses a CPAP machine for his sleep apnea. 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 He also has a fatty liver.   He denies intermittent headaches, shortness of breath on exertion, dizziness, frequent leg cramps or chest pain. He denies any history of blood clot.    He has had polycythemia since in his 30s and used to have phlebotomies to manage polycythemia, last one in 2016.     More recently, labs on 02/01/2024 showed hemoglobin of 18.9, hematocrit of 55.9 and hence a referral was sent to us .     On his initial consultation with us  on 02/09/2024, labs showed improved hemoglobin of 17.4, hematocrit better at 52.6.  White count 7400 with normal differential.  Platelet count stable at 105,000.  CMP, LDH, iron studies, B12, folate were all within normal limits.  Erythropoietin  level was within normal limits.  JAK2 testing followed by reflex testing to include CALR, MPL, exon 12-15 mutations were all negative, thus ruling out primary polycythemia.   Clinical picture is consistent with secondary polycythemia, likely from lung  silicosis causing decreased oxygen transfer.   Goal is to maintain hematocrit below 55 to avoid hyperviscosity related complications.  I have reviewed the past medical history, past surgical history, social history and family history with the patient and they are unchanged from previous note.  ALLERGIES: He is allergic to trazodone  and nefazodone.  MEDICATIONS:  Current  Outpatient Medications  Medication Sig Dispense Refill   FARXIGA  10 MG TABS tablet TAKE 1 TABLET BY MOUTH ONCE DAILY BEFORE BREAKFAST 90 tablet 2   furosemide  (LASIX ) 20 MG tablet Take 1 tablet (20 mg total) by mouth daily. 90 tablet 3   metoprolol  tartrate (LOPRESSOR ) 25 MG tablet Take 1/2 (one-half) tablet by mouth twice daily 90 tablet 0   Multiple Vitamin (MULTIVITAMIN WITH MINERALS) TABS tablet Take 1 tablet by mouth daily.     omeprazole  (PRILOSEC) 20 MG capsule Take 1 capsule by mouth once daily 90 capsule 0   spironolactone  (ALDACTONE ) 25 MG tablet Take 1/2 (one-half) tablet by mouth once daily 45 tablet 0   No current facility-administered medications for this visit.     REVIEW OF SYSTEMS:    Review of Systems - Oncology  All other pertinent systems were reviewed with the patient and are negative.  PHYSICAL EXAMINATION:   Onc Performance Status - 11/21/24 1036       ECOG Perf Status   ECOG Perf Status Fully active, able to carry on all pre-disease performance without restriction      KPS SCALE   KPS % SCORE Normal activity with effort, some s/s of disease            Vitals:   11/21/24 0926  BP: 115/77  Pulse: (!) 55  Resp: 16  Temp: 97.8 F (36.6 C)  SpO2: 96%    Filed Weights   11/21/24 0926  Weight: 247 lb 3.2 oz (112.1 kg)     Physical Exam Constitutional:      General: He is not in acute distress.    Appearance: Normal appearance.  HENT:     Head: Normocephalic and atraumatic.  Eyes:     Conjunctiva/sclera: Conjunctivae normal.  Cardiovascular:     Rate and Rhythm: Normal rate and regular rhythm.     Heart sounds: Normal heart sounds.  Pulmonary:     Effort: Pulmonary effort is normal. No respiratory distress.     Breath sounds: Normal breath sounds.  Abdominal:     General: There is no distension.  Neurological:     General: No focal deficit present.     Mental Status: He is alert and oriented to person, place, and time.   Psychiatric:        Mood and Affect: Mood normal.        Behavior: Behavior normal.     LABORATORY DATA:   I have reviewed the data as listed.  Results for orders placed or performed in visit on 11/21/24  CMP (Cancer Center only)  Result Value Ref Range   Sodium 140 135 - 145 mmol/L   Potassium 5.0 3.5 - 5.1 mmol/L   Chloride 101 98 - 111 mmol/L   CO2 26 22 - 32 mmol/L   Glucose, Bld 115 (H) 70 - 99 mg/dL   BUN 25 (H) 8 - 23 mg/dL   Creatinine 8.60 (H) 9.38 - 1.24 mg/dL   Calcium 89.7 8.9 - 89.6 mg/dL   Total Protein 7.2 6.5 - 8.1 g/dL   Albumin 4.6 3.5 - 5.0 g/dL   AST 19 15 -  41 U/L   ALT 14 0 - 44 U/L   Alkaline Phosphatase 75 38 - 126 U/L   Total Bilirubin 0.7 0.0 - 1.2 mg/dL   GFR, Estimated 52 (L) >60 mL/min   Anion gap 14 5 - 15  Results for orders placed or performed in visit on 11/21/24  CBC with Differential (Cancer Center Only)  Result Value Ref Range   WBC Count 8.8 4.0 - 10.5 K/uL   RBC 6.19 (H) 4.22 - 5.81 MIL/uL   Hemoglobin 18.9 (H) 13.0 - 17.0 g/dL   HCT 43.3 (H) 60.9 - 47.9 %   MCV 91.4 80.0 - 100.0 fL   MCH 30.5 26.0 - 34.0 pg   MCHC 33.4 30.0 - 36.0 g/dL   RDW 86.2 88.4 - 84.4 %   Platelet Count 120 (L) 150 - 400 K/uL   nRBC 0.0 0.0 - 0.2 %   Neutrophils Relative % 70 %   Neutro Abs 6.2 1.7 - 7.7 K/uL   Lymphocytes Relative 21 %   Lymphs Abs 1.8 0.7 - 4.0 K/uL   Monocytes Relative 8 %   Monocytes Absolute 0.7 0.1 - 1.0 K/uL   Eosinophils Relative 1 %   Eosinophils Absolute 0.1 0.0 - 0.5 K/uL   Basophils Relative 0 %   Basophils Absolute 0.0 0.0 - 0.1 K/uL   Immature Granulocytes 0 %   Abs Immature Granulocytes 0.02 0.00 - 0.07 K/uL    RADIOGRAPHIC STUDIES:  No recent pertinent imaging studies available to review.  Orders Placed This Encounter  Procedures   CMP (Cancer Center only)   CMP (Cancer Center only)    Standing Status:   Future    Expiration Date:   11/21/2025   CBC with Differential (Cancer Center Only)    Standing  Status:   Future    Expiration Date:   11/21/2025     Future Appointments  Date Time Provider Department Center  01/05/2025  2:40 PM Rolan Ezra RAMAN, MD [facility] None  01/16/2025  8:15 AM [facility] PHLEBOTOMIST [facility] None  01/16/2025  8:45 AM Autumn Millman, MD [facility] None  02/01/2025  1:20 PM Geofm Glade PARAS, MD [facility]    This document was completed utilizing engineer, civil (consulting). Grammatical errors, random word insertions, pronoun errors, and incomplete sentences are an occasional consequence of this system due to software limitations, ambient noise, and hardware issues. Any formal questions or concerns about the content, text or information contained within the body of this dictation should be directly addressed to the provider for clarification.  "

## 2024-11-21 NOTE — Assessment & Plan Note (Addendum)
 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    More recently, labs on 02/01/2024 showed hemoglobin of 18.9, hematocrit of 55.9 and hence a referral was sent to us .    On his initial consultation with us  on 02/09/2024, labs showed improved hemoglobin of 17.4, hematocrit better at 52.6.  White count 7400 with normal differential.  Platelet count stable at 105,000.  CMP, LDH, iron studies, B12, folate were all within normal limits.  Erythropoietin  level was within normal limits.  JAK2 testing followed by reflex testing to include CALR, MPL, exon 12-15 mutations were all negative, thus ruling out primary polycythemia.  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   Goal is to maintain hematocrit below 55 to avoid hyperviscosity related complications.    Hematocrit is at 56.6, increased compared to prior.  Proposed proceeding with 1 unit of therapeutic phlebotomy.  Patient is asymptomatic.  He is concerned about his low blood pressure and low heart rate and would like to defer therapeutic phlebotomy at this time.  He is willing to reconsider this on return visit in approximately 2 months.  We respect his wishes.  No concerning symptoms at this time.  - Schedule follow-up blood work in 2 months to monitor hematocrit and hemoglobin levels.  - Encourage continued hydration to maintain stable red blood cell counts.

## 2024-11-21 NOTE — Telephone Encounter (Signed)
 Alexis from the lab called stating patients AST/ ALT had been Hemolyzed, Dr. Graylin nurse notified.

## 2024-11-21 NOTE — Assessment & Plan Note (Addendum)
 Platelet count fluctuates between 105,000 and 120,000, below normal range.  Fatty liver and medications may be contributing to thrombocytopenia. He denies bleeding issues.  Platelet count stable at 120,000 today.  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  No intervention needed at current values.  Will continue to monitor for now.

## 2024-12-10 ENCOUNTER — Other Ambulatory Visit: Payer: Self-pay | Admitting: Internal Medicine

## 2024-12-10 ENCOUNTER — Other Ambulatory Visit (HOSPITAL_COMMUNITY): Payer: Self-pay | Admitting: Cardiology

## 2024-12-13 ENCOUNTER — Ambulatory Visit

## 2024-12-14 ENCOUNTER — Telehealth: Payer: Self-pay

## 2024-12-14 ENCOUNTER — Other Ambulatory Visit: Payer: Self-pay

## 2024-12-14 NOTE — Telephone Encounter (Signed)
 Patient has been advised that refill was sent by cardiology on 12/16/24 and to contact pharmacy for refill.

## 2024-12-14 NOTE — Telephone Encounter (Signed)
 Copied from CRM [id number]. Topic: Clinical - Medication Refill >> Dec 14, 2024  9:27 AM Tanazia G wrote: Medication: spironolactone  (ALDACTONE ) 25 MG tablet It has been over 3-4 days since he requested this medication he is hoping for a refill today if possible.  Has the patient contacted their pharmacy? Yes (Agent: If no, request that the patient contact the pharmacy for the refill. If patient does not wish to contact the pharmacy document the reason why and proceed with request.) (Agent: If yes, when and what did the pharmacy advise?)  This is the patient's preferred pharmacy:  [facility] [address], [place] - [address] [place] [address] Phone: [phone] Fax: [phone]  Is this the correct pharmacy for this prescription? Yes If no, delete pharmacy and type the correct one.   Has the prescription been filled recently? Yes  Is the patient out of the medication? Yes  Has the patient been seen for an appointment in the last year OR does the patient have an upcoming appointment? Yes  Can we respond through MyChart? Yes  Agent: Please be advised that Rx refills may take up to 3 business days. We ask that you follow-up with your pharmacy. >> Dec 14, 2024  9:44 AM Nurse Lauraine BROCKS, RN wrote: This medication filled by [facility], pt requesting refill from PCP today

## 2024-12-16 ENCOUNTER — Ambulatory Visit

## 2025-01-05 ENCOUNTER — Ambulatory Visit (HOSPITAL_COMMUNITY): Admitting: Cardiology

## 2025-01-16 ENCOUNTER — Inpatient Hospital Stay: Admitting: Oncology

## 2025-01-16 ENCOUNTER — Inpatient Hospital Stay

## 2025-02-01 ENCOUNTER — Encounter: Admitting: Internal Medicine
